# Patient Record
Sex: Male | Born: 1960 | Race: White | Hispanic: No | Marital: Married | Smoking: Former smoker
Health system: Southern US, Community
[De-identification: ages and names within clinical notes are randomized; demographics above are authoritative.]

## PROBLEM LIST (undated history)

## (undated) DIAGNOSIS — R519 Headache, unspecified: Secondary | ICD-10-CM

## (undated) DIAGNOSIS — I10 Essential (primary) hypertension: Secondary | ICD-10-CM

## (undated) DIAGNOSIS — K219 Gastro-esophageal reflux disease without esophagitis: Secondary | ICD-10-CM

## (undated) DIAGNOSIS — K56609 Unspecified intestinal obstruction, unspecified as to partial versus complete obstruction: Secondary | ICD-10-CM

## (undated) DIAGNOSIS — Z87898 Personal history of other specified conditions: Secondary | ICD-10-CM

## (undated) DIAGNOSIS — M1990 Unspecified osteoarthritis, unspecified site: Secondary | ICD-10-CM

## (undated) DIAGNOSIS — F32A Depression, unspecified: Secondary | ICD-10-CM

## (undated) DIAGNOSIS — K259 Gastric ulcer, unspecified as acute or chronic, without hemorrhage or perforation: Secondary | ICD-10-CM

## (undated) DIAGNOSIS — K222 Esophageal obstruction: Secondary | ICD-10-CM

## (undated) DIAGNOSIS — K621 Rectal polyp: Secondary | ICD-10-CM

## (undated) DIAGNOSIS — F329 Major depressive disorder, single episode, unspecified: Secondary | ICD-10-CM

## (undated) DIAGNOSIS — Z972 Presence of dental prosthetic device (complete) (partial): Secondary | ICD-10-CM

## (undated) HISTORY — DX: Gastric ulcer, unspecified as acute or chronic, without hemorrhage or perforation: K25.9

## (undated) HISTORY — DX: Essential (primary) hypertension: I10

## (undated) HISTORY — DX: Esophageal obstruction: K22.2

## (undated) HISTORY — DX: Rectal polyp: K62.1

## (undated) HISTORY — DX: Major depressive disorder, single episode, unspecified: F32.9

## (undated) HISTORY — PX: HERNIA REPAIR: SHX51

## (undated) HISTORY — DX: Depression, unspecified: F32.A

## (undated) HISTORY — PX: ESOPHAGEAL DILATION: SHX303

---

## 2005-02-19 ENCOUNTER — Other Ambulatory Visit: Payer: Self-pay

## 2005-02-26 ENCOUNTER — Ambulatory Visit: Payer: Self-pay | Admitting: General Surgery

## 2005-11-19 ENCOUNTER — Ambulatory Visit: Payer: Self-pay | Admitting: Family Medicine

## 2006-01-10 ENCOUNTER — Ambulatory Visit: Payer: Self-pay | Admitting: Gastroenterology

## 2007-06-12 ENCOUNTER — Ambulatory Visit: Payer: Self-pay | Admitting: Gastroenterology

## 2007-09-18 ENCOUNTER — Emergency Department: Payer: Self-pay | Admitting: Emergency Medicine

## 2007-09-18 ENCOUNTER — Other Ambulatory Visit: Payer: Self-pay

## 2008-02-03 ENCOUNTER — Ambulatory Visit: Payer: Self-pay | Admitting: Family Medicine

## 2008-07-27 DIAGNOSIS — G2581 Restless legs syndrome: Secondary | ICD-10-CM | POA: Insufficient documentation

## 2008-08-23 DIAGNOSIS — G253 Myoclonus: Secondary | ICD-10-CM

## 2008-08-23 HISTORY — DX: Myoclonus: G25.3

## 2008-11-12 DIAGNOSIS — B079 Viral wart, unspecified: Secondary | ICD-10-CM | POA: Insufficient documentation

## 2009-07-17 ENCOUNTER — Ambulatory Visit: Payer: Self-pay | Admitting: Internal Medicine

## 2009-09-20 HISTORY — PX: SHOULDER SURGERY: SHX246

## 2010-02-03 ENCOUNTER — Emergency Department: Payer: Self-pay | Admitting: Emergency Medicine

## 2010-02-09 ENCOUNTER — Inpatient Hospital Stay: Payer: Self-pay | Admitting: Internal Medicine

## 2010-03-27 ENCOUNTER — Ambulatory Visit: Payer: Self-pay | Admitting: Gastroenterology

## 2010-03-29 LAB — PATHOLOGY REPORT

## 2011-01-15 ENCOUNTER — Ambulatory Visit: Payer: Self-pay | Admitting: Gastroenterology

## 2011-01-17 LAB — PATHOLOGY REPORT

## 2011-02-17 ENCOUNTER — Emergency Department: Payer: Self-pay | Admitting: Emergency Medicine

## 2011-02-28 ENCOUNTER — Emergency Department: Payer: Self-pay | Admitting: *Deleted

## 2012-08-25 ENCOUNTER — Ambulatory Visit: Payer: Self-pay | Admitting: Family Medicine

## 2012-08-25 LAB — DOT URINE DIP
Glucose,UR: NEGATIVE mg/dL (ref 0–75)
Protein: NEGATIVE
Specific Gravity: 1.01 (ref 1.003–1.030)

## 2012-08-25 LAB — URINALYSIS, COMPLETE
Bacteria: NEGATIVE
Bilirubin,UR: NEGATIVE
Glucose,UR: NEGATIVE mg/dL (ref 0–75)
Ketone: NEGATIVE
Leukocyte Esterase: NEGATIVE
Nitrite: NEGATIVE
Ph: 6 (ref 4.5–8.0)
Protein: NEGATIVE
Specific Gravity: 1.01 (ref 1.003–1.030)
Squamous Epithelial: NONE SEEN
WBC UR: NONE SEEN /HPF (ref 0–5)

## 2012-08-28 ENCOUNTER — Ambulatory Visit: Payer: Self-pay | Admitting: Family Medicine

## 2013-04-01 ENCOUNTER — Ambulatory Visit: Payer: Self-pay | Admitting: Family Medicine

## 2013-04-01 LAB — PSA: PSA: 0.5

## 2013-04-16 ENCOUNTER — Ambulatory Visit: Payer: Self-pay | Admitting: Physician Assistant

## 2013-04-16 LAB — COMPREHENSIVE METABOLIC PANEL
Albumin: 3.8 g/dL (ref 3.4–5.0)
Alkaline Phosphatase: 74 U/L
Anion Gap: 11 (ref 7–16)
BUN: 10 mg/dL (ref 7–18)
Bilirubin,Total: 0.3 mg/dL (ref 0.2–1.0)
Calcium, Total: 9.6 mg/dL (ref 8.5–10.1)
Chloride: 97 mmol/L — ABNORMAL LOW (ref 98–107)
Co2: 31 mmol/L (ref 21–32)
Creatinine: 1.12 mg/dL (ref 0.60–1.30)
EGFR (African American): >60
EGFR (Non-African Amer.): >60
Glucose: 106 mg/dL — ABNORMAL HIGH (ref 65–99)
Osmolality: 277 (ref 275–301)
Potassium: 4 mmol/L (ref 3.5–5.1)
SGOT(AST): 21 U/L (ref 15–37)
SGPT (ALT): 32 U/L (ref 12–78)
Sodium: 139 mmol/L (ref 136–145)
Total Protein: 8.2 g/dL (ref 6.4–8.2)

## 2013-04-16 LAB — CBC WITH DIFFERENTIAL/PLATELET
Basophil #: 0 10*3/uL (ref 0.0–0.1)
Basophil %: 0.2 %
Eosinophil #: 0.1 10*3/uL (ref 0.0–0.7)
Eosinophil %: 1 %
HCT: 39.2 % — ABNORMAL LOW (ref 40.0–52.0)
HGB: 13.1 g/dL (ref 13.0–18.0)
Lymphocyte #: 1.4 10*3/uL (ref 1.0–3.6)
Lymphocyte %: 11.5 %
MCH: 31.5 pg (ref 26.0–34.0)
MCHC: 33.3 g/dL (ref 32.0–36.0)
MCV: 94 fL (ref 80–100)
Monocyte #: 0.7 x10 3/mm (ref 0.2–1.0)
Monocyte %: 6.1 %
Neutrophil #: 9.7 10*3/uL — ABNORMAL HIGH (ref 1.4–6.5)
Neutrophil %: 81.2 %
Platelet: 379 10*3/uL (ref 150–440)
RBC: 4.16 10*6/uL — ABNORMAL LOW (ref 4.40–5.90)
RDW: 12.5 % (ref 11.5–14.5)
WBC: 11.9 10*3/uL — ABNORMAL HIGH (ref 3.8–10.6)

## 2013-04-16 LAB — LIPASE, BLOOD: Lipase: 117 U/L (ref 73–393)

## 2013-04-16 LAB — OCCULT BLOOD X 1 CARD TO LAB, STOOL: Occult Blood, Feces: NEGATIVE

## 2013-04-16 LAB — AMYLASE: Amylase: 66 U/L (ref 25–115)

## 2013-08-09 ENCOUNTER — Ambulatory Visit: Payer: Self-pay | Admitting: Physician Assistant

## 2013-08-09 LAB — DOT URINE DIP
Glucose,UR: NEGATIVE mg/dL (ref 0–75)
Protein: NEGATIVE
Specific Gravity: 1.005 (ref 1.003–1.030)

## 2013-10-11 LAB — FECAL OCCULT BLOOD, GUAIAC: Fecal Occult Blood: NEGATIVE

## 2014-07-28 ENCOUNTER — Ambulatory Visit
Admit: 2014-07-28 | Disposition: A | Discharge status: Home / Self Care | Payer: Self-pay | Attending: Family Medicine | Admitting: Family Medicine

## 2014-07-28 LAB — DOT URINE DIP
Glucose,UR: NEGATIVE
Protein: NEGATIVE
Specific Gravity: 1.02 (ref 1.000–1.030)

## 2014-08-08 LAB — LIPID PANEL
Cholesterol: 201 mg/dL — AB (ref 0–200)
HDL: 50 mg/dL (ref 35–70)
LDL Cholesterol: 113 mg/dL
Triglycerides: 189 mg/dL — AB (ref 40–160)

## 2014-08-08 LAB — BASIC METABOLIC PANEL
Creatinine: 1 mg/dL (ref <=1.3)
Glucose: 91 mg/dL

## 2014-08-25 DIAGNOSIS — Z0000 Encounter for general adult medical examination without abnormal findings: Secondary | ICD-10-CM | POA: Insufficient documentation

## 2014-08-25 DIAGNOSIS — D509 Iron deficiency anemia, unspecified: Secondary | ICD-10-CM | POA: Insufficient documentation

## 2014-08-25 DIAGNOSIS — I10 Essential (primary) hypertension: Secondary | ICD-10-CM | POA: Insufficient documentation

## 2014-08-25 DIAGNOSIS — F339 Major depressive disorder, recurrent, unspecified: Secondary | ICD-10-CM | POA: Insufficient documentation

## 2014-08-25 DIAGNOSIS — M5137 Other intervertebral disc degeneration, lumbosacral region: Secondary | ICD-10-CM | POA: Insufficient documentation

## 2014-08-25 DIAGNOSIS — K922 Gastrointestinal hemorrhage, unspecified: Secondary | ICD-10-CM | POA: Insufficient documentation

## 2014-08-25 DIAGNOSIS — M51379 Other intervertebral disc degeneration, lumbosacral region without mention of lumbar back pain or lower extremity pain: Secondary | ICD-10-CM | POA: Insufficient documentation

## 2014-08-25 DIAGNOSIS — E785 Hyperlipidemia, unspecified: Secondary | ICD-10-CM | POA: Insufficient documentation

## 2014-08-25 DIAGNOSIS — M5440 Lumbago with sciatica, unspecified side: Secondary | ICD-10-CM | POA: Insufficient documentation

## 2014-11-13 ENCOUNTER — Other Ambulatory Visit: Payer: Self-pay | Admitting: Family Medicine

## 2014-11-13 DIAGNOSIS — M545 Low back pain, unspecified: Secondary | ICD-10-CM

## 2014-11-13 DIAGNOSIS — I10 Essential (primary) hypertension: Secondary | ICD-10-CM

## 2015-01-31 ENCOUNTER — Other Ambulatory Visit: Payer: Self-pay | Admitting: Family Medicine

## 2015-03-01 ENCOUNTER — Other Ambulatory Visit: Payer: Self-pay | Admitting: Family Medicine

## 2015-03-10 ENCOUNTER — Encounter: Payer: Self-pay | Admitting: Family Medicine

## 2015-03-10 ENCOUNTER — Ambulatory Visit (INDEPENDENT_AMBULATORY_CARE_PROVIDER_SITE_OTHER): Payer: BLUE CROSS/BLUE SHIELD | Admitting: Family Medicine

## 2015-03-10 VITALS — BP 138/88 | HR 76 | Ht 63.0 in | Wt 166.0 lb

## 2015-03-10 DIAGNOSIS — M519 Unspecified thoracic, thoracolumbar and lumbosacral intervertebral disc disorder: Secondary | ICD-10-CM | POA: Diagnosis not present

## 2015-03-10 DIAGNOSIS — I10 Essential (primary) hypertension: Secondary | ICD-10-CM | POA: Diagnosis not present

## 2015-03-10 MED ORDER — BACLOFEN 20 MG PO TABS: ORAL_TABLET | ORAL | Status: DC

## 2015-03-10 MED ORDER — AMLODIPINE BESY-BENAZEPRIL HCL 10-20 MG PO CAPS: ORAL_CAPSULE | ORAL | Status: DC

## 2015-03-10 NOTE — Progress Notes (Signed)
Name: Alan Trevino   MRN: [id number]    DOB: [birth year]   Date:03/10/2015       Progress Note  Subjective  Chief Complaint  Chief Complaint  Patient presents with  . Hypertension  . Back Pain    needs refill on Baclofen- takes at night to help relax so he can sleep    Hypertension This is a chronic problem. The current episode started more than 1 year ago. The problem has been gradually improving since onset. The problem is controlled. Pertinent negatives include no anxiety, blurred vision, chest pain, headaches, malaise/fatigue, neck pain, orthopnea, palpitations, peripheral edema, PND, shortness of breath or sweats. There are no known risk factors for coronary artery disease. Past treatments include ACE inhibitors and calcium channel blockers. The current treatment provides mild improvement. There are no compliance problems.  There is no history of angina, kidney disease, CAD/MI, CVA, heart failure, left ventricular hypertrophy, PVD, renovascular disease or retinopathy. There is no history of chronic renal disease or a hypertension causing med.  Back Pain This is a chronic problem. The current episode started more than 1 year ago. The problem occurs daily. The problem has been waxing and waning since onset. The pain is present in the lumbar spine. The quality of the pain is described as aching. The pain does not radiate. The pain is mild. The symptoms are aggravated by bending and twisting. Pertinent negatives include no abdominal pain, bladder incontinence, bowel incontinence, chest pain, dysuria, fever, headaches, leg pain, numbness, paresis, paresthesias, tingling, weakness or weight loss.    No problem-specific assessment & plan notes found for this encounter.   Past Medical History  Diagnosis Date  . Hypertension   . Depression     Past Surgical History  Procedure Laterality Date  . Hernia repair    . Shoulder surgery  09/2009  . Esophageal dilation      stretching     History reviewed. No pertinent family history.  Social History   Social History  . Marital Status: Divorced    Spouse Name: N/A  . Number of Children: N/A  . Years of Education: N/A   Occupational History  . Not on file.   Social History Main Topics  . Smoking status: Former Research scientist (life sciences)  . Smokeless tobacco: Not on file  . Alcohol Use: No  . Drug Use: No  . Sexual Activity: Not Currently   Other Topics Concern  . Not on file   Social History Narrative    No Known Allergies   Review of Systems  Constitutional: Negative for fever, chills, weight loss and malaise/fatigue.  HENT: Negative for ear discharge, ear pain and sore throat.   Eyes: Negative for blurred vision.  Respiratory: Negative for cough, sputum production, shortness of breath and wheezing.   Cardiovascular: Negative for chest pain, palpitations, orthopnea, leg swelling and PND.  Gastrointestinal: Negative for heartburn, nausea, abdominal pain, diarrhea, constipation, blood in stool, melena and bowel incontinence.  Genitourinary: Negative for bladder incontinence, dysuria, urgency, frequency and hematuria.  Musculoskeletal: Positive for back pain. Negative for myalgias, joint pain and neck pain.  Skin: Negative for rash.  Neurological: Negative for dizziness, tingling, sensory change, focal weakness, weakness, numbness, headaches and paresthesias.  Endo/Heme/Allergies: Negative for environmental allergies and polydipsia. Does not bruise/bleed easily.  Psychiatric/Behavioral: Negative for depression and suicidal ideas. The patient is not nervous/anxious and does not have insomnia.      Objective  Filed Vitals:   03/10/15 1511  BP: 138/88  Pulse:  76  Height: 5\' 3"  (1.6 m)  Weight: 166 lb (75.297 kg)    Physical Exam  Constitutional: He is oriented to person, place, and time and well-developed, well-nourished, and in no distress.  HENT:  Head: Normocephalic.  Right Ear: External ear normal.  Left  Ear: External ear normal.  Nose: Nose normal.  Mouth/Throat: Oropharynx is clear and moist.  Eyes: Conjunctivae and EOM are normal. Pupils are equal, round, and reactive to light. Right eye exhibits no discharge. Left eye exhibits no discharge. No scleral icterus.  Neck: Normal range of motion. Neck supple. No JVD present. No tracheal deviation present. No thyromegaly present.  Cardiovascular: Normal rate, regular rhythm, normal heart sounds and intact distal pulses.  Exam reveals no gallop and no friction rub.   No murmur heard. Pulmonary/Chest: Breath sounds normal. No respiratory distress. He has no wheezes. He has no rales.  Abdominal: Soft. Bowel sounds are normal. He exhibits no mass. There is no hepatosplenomegaly. There is no tenderness. There is no rebound, no guarding and no CVA tenderness.  Musculoskeletal: Normal range of motion. He exhibits no edema or tenderness.  Lymphadenopathy:    He has no cervical adenopathy.  Neurological: He is alert and oriented to person, place, and time. He has normal sensation, normal strength and intact cranial nerves. No cranial nerve deficit.  Skin: Skin is warm. No rash noted.  Psychiatric: Mood and affect normal.      Assessment & Plan  Problem List Items Addressed This Visit    None    Visit Diagnoses    Essential hypertension    -  Primary    Relevant Medications    amLODipine-benazepril (LOTREL) 10-20 MG capsule    Other Relevant Orders    Renal Function Panel    Lumbar disc disease        Relevant Medications    baclofen (LIORESAL) 20 MG tablet         Dr. [facility]  03/10/2015

## 2015-03-11 LAB — RENAL FUNCTION PANEL
Albumin: 4.8 g/dL (ref 3.5–5.5)
BUN/Creatinine Ratio: 11 (ref 9–20)
BUN: 13 mg/dL (ref 6–24)
CO2: 27 mmol/L (ref 18–29)
Calcium: 10.2 mg/dL (ref 8.7–10.2)
Chloride: 96 mmol/L — ABNORMAL LOW (ref 97–106)
Creatinine, Ser: 1.17 mg/dL (ref 0.76–1.27)
GFR calc Af Amer: 81 mL/min/{1.73_m2} (ref >59)
GFR calc non Af Amer: 70 mL/min/{1.73_m2} (ref >59)
Glucose: 89 mg/dL (ref 65–99)
Phosphorus: 4.2 mg/dL (ref 2.5–4.5)
Potassium: 4.6 mmol/L (ref 3.5–5.2)
Sodium: 140 mmol/L (ref 136–144)

## 2015-06-30 ENCOUNTER — Encounter: Payer: Self-pay | Admitting: *Deleted

## 2015-06-30 ENCOUNTER — Ambulatory Visit
Admission: EM | Admit: 2015-06-30 | Discharge: 2015-06-30 | Disposition: A | Discharge status: Home / Self Care | Payer: Self-pay | Attending: Family Medicine | Admitting: Family Medicine

## 2015-06-30 DIAGNOSIS — Z024 Encounter for examination for driving license: Secondary | ICD-10-CM

## 2015-06-30 DIAGNOSIS — Z029 Encounter for administrative examinations, unspecified: Secondary | ICD-10-CM

## 2015-06-30 LAB — DEPT OF TRANSP DIPSTICK, URINE (ARMC ONLY)
Glucose, UA: NEGATIVE mg/dL
Protein, ur: NEGATIVE mg/dL
Specific Gravity, Urine: 1.01 (ref 1.005–1.030)

## 2015-06-30 NOTE — Discharge Instructions (Signed)
Medical Screening Exam °A medical screening exam has been done. This exam helps find the cause of your problem and determines whether you need emergency treatment. Your exam has shown that you do not need emergency treatment at this point. It is safe for you to go to your caregiver's office or clinic for treatment. You should make an appointment today to see your caregiver as soon as he or she is available. °Depending on your illness, your symptoms and condition can change over time. If your condition gets worse or you develop new or troubling symptoms before you see your caregiver, you should return to the emergency department for further evaluation.  °  °This information is not intended to replace advice given to you by your health care provider. Make sure you discuss any questions you have with your health care provider. °  °Document Released: 05/16/2004 Document Revised: 04/29/2014 Document Reviewed: 12/26/2010 °Elsevier Interactive Patient Education ©2016 [employer] ° °

## 2015-06-30 NOTE — ED Provider Notes (Signed)
CSN: [id number]     Arrival date & time 06/30/15  0941 History   First MD Initiated Contact with Patient 06/30/15 1102    Nurses notes were reviewed. Chief Complaint  Patient presents with  . Commercial Driver's License Exam   Patient has a history of hypertension and depression his blood pressures under control and we have a note from his Dr. Kasandra Knudsen that he is doing well Wellbutrin XL buspirone and Ritalin Patient is here for DOT examination. (Consider location/radiation/quality/duration/timing/severity/associated sxs/prior Treatment) The history is provided by the patient. No language interpreter was used.    Past Medical History  Diagnosis Date  . Hypertension   . Depression    Past Surgical History  Procedure Laterality Date  . Hernia repair    . Shoulder surgery  09/2009  . Esophageal dilation      stretching   History reviewed. No pertinent family history. Social History  Substance Use Topics  . Smoking status: Former Research scientist (life sciences)  . Smokeless tobacco: None  . Alcohol Use: No    Review of Systems  All other systems reviewed and are negative.   Allergies  Review of patient's allergies indicates no known allergies.  Home Medications   Prior to Admission medications   Medication Sig Start Date End Date Taking? Authorizing Provider  amLODipine-benazepril (LOTREL) 10-20 MG capsule TAKE 1 CAPSULE(S) CAPSULE(S), ORAL, DAILY 03/10/15  Yes Juline Patch, MD  buPROPion (WELLBUTRIN XL) 300 MG 24 hr tablet Take 1 tablet by mouth daily. Dr Kasandra Knudsen 10/11/13  Yes Historical Provider, MD  busPIRone (BUSPAR) 15 MG tablet Take 1 tablet by mouth 2 (two) times daily. Dr Kasandra Knudsen   Yes Historical Provider, MD  methylphenidate (RITALIN) 20 MG tablet Take 1 tablet by mouth 2 (two) times daily.   Yes Historical Provider, MD  baclofen (LIORESAL) 20 MG tablet TAKE 1 TABLET BY MOUTH EVERY DAY-MAKE APPT FOR REFILLS 03/10/15   Juline Patch, MD   Meds Ordered and Administered this Visit  Medications - No  data to display  BP 133/79 mmHg  Pulse 63  Temp(Src) 98 F (36.7 C) (Oral)  Resp 16  Ht 5\' 4"  (1.626 m)  Wt 157 lb (71.215 kg)  BMI 26.94 kg/m2  SpO2 100% No data found.   Physical Exam  Constitutional: He is oriented to person, place, and time. He appears well-developed and well-nourished.  HENT:  Head: Normocephalic.  Eyes: Pupils are equal, round, and reactive to light.  Neck: Normal range of motion.  Cardiovascular: Normal rate and regular rhythm.   Pulmonary/Chest: Effort normal and breath sounds normal.  Abdominal: Soft. Bowel sounds are normal. He exhibits no distension. Hernia confirmed negative in the right inguinal area and confirmed negative in the left inguinal area.  Genitourinary: Testes normal and penis normal. Right testis shows no mass and no tenderness. Left testis shows no mass and no tenderness.  Musculoskeletal: Normal range of motion. He exhibits no tenderness.  Neurological: He is alert and oriented to person, place, and time.  Skin: Skin is warm and dry.  Psychiatric: He has a normal mood and affect.  Vitals reviewed.   ED Course  Procedures (including critical care time)  Labs Review Labs Reviewed  DEPT OF TRANSP DIPSTICK, URINE([facility] ONLY)    Imaging Review No results found.   Visual Acuity Review  Right Eye Distance:   Left Eye Distance:   Bilateral Distance:    Right Eye Near:   Left Eye Near:    Bilateral Near:  Results for orders placed or performed during the hospital encounter of 06/30/15  Dept of Transp dipstick, urine  Result Value Ref Range   Protein, ur NEGATIVE NEGATIVE mg/dL   Glucose, UA NEGATIVE NEGATIVE mg/dL   Specific Gravity, Urine 1.010 1.005 - 1.030   Hgb urine dipstick TRACE (A) NEGATIVE     MDM  No diagnosis found. Patient seen for DOT examination. Will give one year certificate with glasses restriction.    Frederich Cha, MD 06/30/15 [phone]

## 2015-06-30 NOTE — ED Notes (Signed)
DOT physical. 

## 2015-09-23 ENCOUNTER — Other Ambulatory Visit: Payer: Self-pay | Admitting: Family Medicine

## 2015-10-04 ENCOUNTER — Other Ambulatory Visit: Payer: Self-pay | Admitting: Family Medicine

## 2015-11-03 ENCOUNTER — Encounter: Payer: Self-pay | Admitting: Family Medicine

## 2015-11-03 ENCOUNTER — Ambulatory Visit (INDEPENDENT_AMBULATORY_CARE_PROVIDER_SITE_OTHER): Payer: BLUE CROSS/BLUE SHIELD | Admitting: Family Medicine

## 2015-11-03 VITALS — BP 120/80 | HR 84 | Ht 64.0 in | Wt 154.0 lb

## 2015-11-03 DIAGNOSIS — F339 Major depressive disorder, recurrent, unspecified: Secondary | ICD-10-CM | POA: Diagnosis not present

## 2015-11-03 DIAGNOSIS — Z1211 Encounter for screening for malignant neoplasm of colon: Secondary | ICD-10-CM | POA: Diagnosis not present

## 2015-11-03 DIAGNOSIS — M5137 Other intervertebral disc degeneration, lumbosacral region: Secondary | ICD-10-CM

## 2015-11-03 DIAGNOSIS — I10 Essential (primary) hypertension: Secondary | ICD-10-CM | POA: Diagnosis not present

## 2015-11-03 DIAGNOSIS — N400 Enlarged prostate without lower urinary tract symptoms: Secondary | ICD-10-CM | POA: Diagnosis not present

## 2015-11-03 DIAGNOSIS — M519 Unspecified thoracic, thoracolumbar and lumbosacral intervertebral disc disorder: Secondary | ICD-10-CM | POA: Diagnosis not present

## 2015-11-03 LAB — HEMOCCULT GUIAC POC 1CARD (OFFICE): Fecal Occult Blood, POC: NEGATIVE

## 2015-11-03 MED ORDER — BACLOFEN 20 MG PO TABS: ORAL_TABLET | ORAL | Status: DC

## 2015-11-03 MED ORDER — AMLODIPINE BESY-BENAZEPRIL HCL 10-20 MG PO CAPS: ORAL_CAPSULE | ORAL | Status: DC

## 2015-11-03 NOTE — Progress Notes (Signed)
Name: Alan Trevino   MRN: [id number]    DOB: 1961/04/06   Date:11/03/2015       Progress Note  Subjective  Chief Complaint  Chief Complaint  Patient presents with  . Hypertension  . 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 The current episode started more than 1 year ago. The problem has been gradually improving since onset. The problem is controlled. Pertinent negatives include no anxiety, blurred vision, chest pain, headaches, malaise/fatigue, neck pain, orthopnea, palpitations, peripheral edema, PND, shortness of breath or sweats. There are no associated agents to hypertension. There are no known risk factors for coronary artery disease. Past treatments include ACE inhibitors and calcium channel blockers. The current treatment provides moderate improvement. There are no compliance problems.  There is no history of angina, kidney disease, CAD/MI, CVA, heart failure, left ventricular hypertrophy, PVD, renovascular disease or retinopathy. There is no history of chronic renal disease or a hypertension causing med.  Back Pain This is a chronic problem. The current episode started more than 1 year ago. The problem has been waxing and waning since onset. The pain is present in the lumbar spine. The quality of the pain is described as aching. Pertinent negatives include no abdominal pain, chest pain, dysuria, fever, headaches, leg pain, paresis, paresthesias, perianal numbness, tingling, weakness or weight loss. He has tried NSAIDs for the symptoms. The treatment provided mild relief.    No problem-specific assessment & plan notes found for this encounter.   Past Medical History  Diagnosis Date  . Hypertension   . Depression     Past Surgical History  Procedure Laterality Date  . Hernia repair    . Shoulder surgery  09/2009  . Esophageal dilation      stretching    History reviewed. No pertinent family history.  Social  History   Social History  . Marital Status: Divorced    Spouse Name: N/A  . Number of Children: N/A  . Years of Education: N/A   Occupational History  . Not on file.   Social History Main Topics  . Smoking status: Former Research scientist (life sciences)  . Smokeless tobacco: Not on file  . Alcohol Use: No  . Drug Use: No  . Sexual Activity: Not Currently   Other Topics Concern  . Not on file   Social History Narrative    No Known Allergies   Review of Systems  Constitutional: Negative for fever, chills, weight loss and malaise/fatigue.  HENT: Negative for ear discharge, ear pain and sore throat.   Eyes: Negative for blurred vision.  Respiratory: Negative for cough, sputum production, shortness of breath and wheezing.   Cardiovascular: Negative for chest pain, palpitations, orthopnea, leg swelling and PND.  Gastrointestinal: Negative for heartburn, nausea, abdominal pain, diarrhea, constipation, blood in stool and melena.  Genitourinary: Negative for dysuria, urgency, frequency and hematuria.  Musculoskeletal: Positive for back pain. Negative for myalgias, joint pain and neck pain.  Skin: Negative for rash.  Neurological: Negative for dizziness, tingling, sensory change, focal weakness, weakness, headaches and paresthesias.  Endo/Heme/Allergies: Negative for environmental allergies and polydipsia. Does not bruise/bleed easily.  Psychiatric/Behavioral: Negative for depression and suicidal ideas. The patient is not nervous/anxious and does not have insomnia.      Objective  Filed Vitals:   11/03/15 0832  BP: 120/80  Pulse: 84  Height: 5\' 4"  (1.626 m)  Weight: 154 lb (69.854 kg)    Physical  Exam  Constitutional: He is oriented to person, place, and time and well-developed, well-nourished, and in no distress.  HENT:  Head: Normocephalic.  Right Ear: External ear normal.  Left Ear: External ear normal.  Nose: Nose normal.  Mouth/Throat: Oropharynx is clear and moist.  Eyes: Conjunctivae  and EOM are normal. Pupils are equal, round, and reactive to light. Right eye exhibits no discharge. Left eye exhibits no discharge. No scleral icterus.  Neck: Normal range of motion. Neck supple. No JVD present. No tracheal deviation present. No thyromegaly present.  Cardiovascular: Normal rate, regular rhythm, normal heart sounds and intact distal pulses.  Exam reveals no gallop and no friction rub.   No murmur heard. Pulmonary/Chest: Breath sounds normal. No respiratory distress. He has no wheezes. He has no rales.  Abdominal: Soft. Bowel sounds are normal. He exhibits no mass. There is no hepatosplenomegaly. There is no tenderness. There is no rebound, no guarding and no CVA tenderness.  Genitourinary: Rectum normal. Prostate is enlarged. Prostate is not tender.  Musculoskeletal: Normal range of motion. He exhibits no edema or tenderness.  Lymphadenopathy:    He has no cervical adenopathy.  Neurological: He is alert and oriented to person, place, and time. He has normal sensation, normal strength, normal reflexes and intact cranial nerves. No cranial nerve deficit.  Skin: Skin is warm. No rash noted.  Psychiatric: Mood and affect normal.  Nursing note and vitals reviewed.     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 Otilio [facility]  11/03/2015

## 2015-11-04 LAB — PSA: Prostate Specific Ag, Serum: 1 ng/mL (ref 0.0–4.0)

## 2015-11-10 ENCOUNTER — Other Ambulatory Visit: Payer: Self-pay

## 2015-11-10 ENCOUNTER — Telehealth: Payer: Self-pay

## 2015-11-10 NOTE — Telephone Encounter (Signed)
Gastroenterology Pre-Procedure Review  Request Date: 12/29/2015 Requesting Physician: Dr. Ronnald Ramp  PATIENT REVIEW QUESTIONS: The patient responded to the following health history questions as indicated:    1. Are you having any GI issues? yes (constipation) 2. Do you have a personal history of Polyps? no 3. Do you have a family history of Colon Cancer or Polyps? yes (colon cancer) 4. Diabetes Mellitus? no 5. Joint replacements in the past 12 months?no 6. Major health problems in the past 3 months?no 7. Any artificial heart valves, MVP, or defibrillator?no    MEDICATIONS & ALLERGIES:    Patient reports the following regarding taking any anticoagulation/antiplatelet therapy:   Plavix, Coumadin, Eliquis, Xarelto, Lovenox, Pradaxa, Brilinta, or Effient? no Aspirin? no  Patient confirms/reports the following medications:  Current Outpatient Prescriptions  Medication Sig Dispense Refill  . amLODipine-benazepril (LOTREL) 10-20 MG capsule TAKE 1 CAPSULE(S) CAPSULE(S) BY MOUTH DAILY 30 capsule 6  . baclofen (LIORESAL) 20 MG tablet TAKE 1 TABLET BY MOUTH EVERY DAY-MAKE APPT FOR REFILLS 30 tablet 6  . buPROPion (WELLBUTRIN XL) 300 MG 24 hr tablet Take 1 tablet by mouth daily. Dr Kasandra Knudsen    . busPIRone (BUSPAR) 15 MG tablet Take 1 tablet by mouth 2 (two) times daily. Dr Kasandra Knudsen    . methylphenidate (RITALIN) 20 MG tablet Take 1 tablet by mouth 2 (two) times daily. Dr Kasandra Knudsen     No current facility-administered medications for this visit.    Patient confirms/reports the following allergies:  No Known Allergies  No orders of the defined types were placed in this encounter.    AUTHORIZATION INFORMATION Primary Insurance: 1D#: Group #:  Secondary Insurance: 1D#: Group #:  SCHEDULE INFORMATION: Date: 12/29/2015  Time: Location: [facility]

## 2015-12-28 NOTE — Discharge Instructions (Signed)

## 2015-12-29 ENCOUNTER — Ambulatory Visit: Payer: BLUE CROSS/BLUE SHIELD | Admitting: Anesthesiology

## 2015-12-29 ENCOUNTER — Encounter
Admission: RE | Disposition: A | Discharge status: Home / Self Care | Payer: Self-pay | Source: Ambulatory Visit | Attending: Gastroenterology

## 2015-12-29 ENCOUNTER — Ambulatory Visit
Admission: RE | Admit: 2015-12-29 | Discharge: 2015-12-29 | Disposition: A | Discharge status: Home / Self Care | Payer: BLUE CROSS/BLUE SHIELD | Source: Ambulatory Visit | Attending: Gastroenterology | Admitting: Gastroenterology

## 2015-12-29 DIAGNOSIS — K621 Rectal polyp: Secondary | ICD-10-CM | POA: Diagnosis not present

## 2015-12-29 DIAGNOSIS — K5730 Diverticulosis of large intestine without perforation or abscess without bleeding: Secondary | ICD-10-CM | POA: Diagnosis not present

## 2015-12-29 DIAGNOSIS — D122 Benign neoplasm of ascending colon: Secondary | ICD-10-CM

## 2015-12-29 DIAGNOSIS — Z9889 Other specified postprocedural states: Secondary | ICD-10-CM | POA: Insufficient documentation

## 2015-12-29 DIAGNOSIS — F329 Major depressive disorder, single episode, unspecified: Secondary | ICD-10-CM | POA: Diagnosis not present

## 2015-12-29 DIAGNOSIS — I10 Essential (primary) hypertension: Secondary | ICD-10-CM | POA: Diagnosis not present

## 2015-12-29 DIAGNOSIS — K641 Second degree hemorrhoids: Secondary | ICD-10-CM | POA: Diagnosis not present

## 2015-12-29 DIAGNOSIS — D124 Benign neoplasm of descending colon: Secondary | ICD-10-CM

## 2015-12-29 DIAGNOSIS — D123 Benign neoplasm of transverse colon: Secondary | ICD-10-CM | POA: Diagnosis not present

## 2015-12-29 DIAGNOSIS — Z79899 Other long term (current) drug therapy: Secondary | ICD-10-CM | POA: Insufficient documentation

## 2015-12-29 DIAGNOSIS — Z1211 Encounter for screening for malignant neoplasm of colon: Secondary | ICD-10-CM

## 2015-12-29 DIAGNOSIS — Z87891 Personal history of nicotine dependence: Secondary | ICD-10-CM | POA: Insufficient documentation

## 2015-12-29 HISTORY — PX: COLONOSCOPY WITH PROPOFOL: SHX5780

## 2015-12-29 HISTORY — PX: POLYPECTOMY: SHX5525

## 2015-12-29 SURGERY — COLONOSCOPY WITH PROPOFOL
Anesthesia: Monitor Anesthesia Care | Site: Rectum | Wound class: Contaminated

## 2015-12-29 MED ORDER — LACTATED RINGERS IV SOLN
INTRAVENOUS | Status: DC
  Administered 2015-12-29: 10:00:00 via INTRAVENOUS

## 2015-12-29 MED ORDER — SIMETHICONE 40 MG/0.6ML PO SUSP
ORAL | Status: DC | PRN
  Administered 2015-12-29: 11:00:00

## 2015-12-29 MED ORDER — LIDOCAINE HCL (CARDIAC) 20 MG/ML IV SOLN
INTRAVENOUS | Status: DC | PRN
  Administered 2015-12-29: 50 mg via INTRAVENOUS

## 2015-12-29 MED ORDER — PROPOFOL 10 MG/ML IV BOLUS
INTRAVENOUS | Status: DC | PRN
  Administered 2015-12-29 (×2): 100 mg via INTRAVENOUS
  Administered 2015-12-29 (×3): 50 mg via INTRAVENOUS

## 2015-12-29 SURGICAL SUPPLY — 23 items
CANISTER SUCT 1200ML W/VALVE (MISCELLANEOUS) ×3 IMPLANT
CLIP HMST 235XBRD CATH ROT (MISCELLANEOUS) IMPLANT
CLIP RESOLUTION 360 11X235 (MISCELLANEOUS)
FCP ESCP3.2XJMB 240X2.8X (MISCELLANEOUS)
FORCEPS BIOP RAD 4 LRG CAP 4 (CUTTING FORCEPS) IMPLANT
FORCEPS BIOP RJ4 240 W/NDL (MISCELLANEOUS)
FORCEPS ESCP3.2XJMB 240X2.8X (MISCELLANEOUS) IMPLANT
GOWN CVR UNV OPN BCK APRN NK (MISCELLANEOUS) ×2 IMPLANT
GOWN ISOL THUMB LOOP REG UNIV (MISCELLANEOUS) ×4
INJECTOR VARIJECT VIN23 (MISCELLANEOUS) IMPLANT
KIT DEFENDO VALVE AND CONN (KITS) IMPLANT
KIT ENDO PROCEDURE OLY (KITS) ×3 IMPLANT
MARKER SPOT ENDO TATTOO 5ML (MISCELLANEOUS) IMPLANT
PAD GROUND ADULT SPLIT (MISCELLANEOUS) ×3 IMPLANT
PROBE APC STR FIRE (PROBE) IMPLANT
RETRIEVER NET ROTH 2.5X230 LF (MISCELLANEOUS) IMPLANT
SNARE SHORT THROW 13M SML OVAL (MISCELLANEOUS) ×3 IMPLANT
SNARE SHORT THROW 30M LRG OVAL (MISCELLANEOUS) IMPLANT
SNARE SNG USE RND 15MM (INSTRUMENTS) IMPLANT
SPOT EX ENDOSCOPIC TATTOO (MISCELLANEOUS)
TRAP ETRAP POLY (MISCELLANEOUS) ×6 IMPLANT
VARIJECT INJECTOR VIN23 (MISCELLANEOUS)
WATER STERILE IRR 250ML POUR (IV SOLUTION) ×3 IMPLANT

## 2015-12-29 NOTE — Op Note (Signed)
[facility] Gastroenterology Patient Name: Alan Trevino Procedure Date: 12/29/2015 11:00 AM MRN: [id number] Account #: [id number] Date of Birth: [birth year] Admit Type: Outpatient Age: 55 Room: [facility] OR ROOM 01 Gender: Male Note Status: Finalized Procedure:            Colonoscopy Indications:          Screening for colorectal malignant neoplasm Providers:            [name] MD, MD Referring MD:         Juline Patch, MD (Referring MD) Medicines:            Propofol per Anesthesia Complications:        No immediate complications. Procedure:            Pre-Anesthesia Assessment:                       - Prior to the procedure, a History and Physical was                        performed, and patient medications and allergies were                        reviewed. The patient's tolerance of previous                        anesthesia was also reviewed. The risks and benefits of                        the procedure and the sedation options and risks were                        discussed with the patient. All questions were                        answered, and informed consent was obtained. Prior                        Anticoagulants: The patient has taken no previous                        anticoagulant or antiplatelet agents. ASA Grade                        Assessment: II - A patient with mild systemic disease.                        After reviewing the risks and benefits, the patient was                        deemed in satisfactory condition to undergo the                        procedure.                       After obtaining informed consent, the colonoscope was                        passed under direct vision. Throughout the procedure,  the patient's blood pressure, pulse, and oxygen                        saturations were monitored continuously. The Olympus CF                        H180AL colonoscope (S#: [id number]) was introduced through                         the anus and advanced to the the cecum, identified by                        appendiceal orifice and ileocecal valve. The                        colonoscopy was performed without difficulty. The                        patient tolerated the procedure well. The quality of                        the bowel preparation was excellent. Findings:      The perianal and digital rectal examinations were normal.      Two sessile polyps were found in the ascending colon. The polyps were 4       to 6 mm in size. These polyps were removed with a hot snare. Resection       and retrieval were complete.      A 4 mm polyp was found in the transverse colon. The polyp was sessile.       The polyp was removed with a cold snare. Resection and retrieval were       complete.      A 4 mm polyp was found in the descending colon. The polyp was sessile.       The polyp was removed with a cold snare. Resection and retrieval were       complete.      A 3 mm polyp was found in the rectum. The polyp was sessile. The polyp       was removed with a cold biopsy forceps. Resection and retrieval were       complete.      Multiple small-mouthed diverticula were found in the sigmoid colon.      Non-bleeding internal hemorrhoids were found during retroflexion. The       hemorrhoids were Grade II (internal hemorrhoids that prolapse but reduce       spontaneously). Impression:           - Two 4 to 6 mm polyps in the ascending colon, removed                        with a hot snare. Resected and retrieved.                       - One 4 mm polyp in the transverse colon, removed with                        a cold snare. Resected and retrieved.                       -  One 4 mm polyp in the descending colon, removed with                        a cold snare. Resected and retrieved.                       - One 3 mm polyp in the rectum, removed with a cold                        biopsy forceps. Resected and  retrieved.                       - Diverticulosis in the sigmoid colon.                       - Non-bleeding internal hemorrhoids. Recommendation:       - Discharge patient to home.                       - Resume previous diet.                       - Continue present medications.                       - Await pathology results.                       - Repeat colonoscopy in 5 years if polyp adenoma and 10                        years if hyperplastic Procedure Code(s):    --- Professional ---                       [phone], Colonoscopy, flexible; with removal of tumor(s),                        polyp(s), or other lesion(s) by snare technique                       45380, 73, Colonoscopy, flexible; with biopsy, single                        or multiple Diagnosis Code(s):    --- Professional ---                       Z12.11, Encounter for screening for malignant neoplasm                        of colon                       D12.2, Benign neoplasm of ascending colon                       D12.3, Benign neoplasm of transverse colon (hepatic                        flexure or splenic flexure)                       D12.4, Benign neoplasm of descending colon  K62.1, Rectal polyp CPT copyright 2016 American Medical Association. All rights reserved. The codes documented in this report are preliminary and upon coder review may  be revised to meet current compliance requirements. [name] MD, MD 12/29/2015 11:27:55 AM This report has been signed electronically. Number of Addenda: 0 Note Initiated On: 12/29/2015 11:00 AM Scope Withdrawal Time: 0 hours 9 minutes 33 seconds  Total Procedure Duration: 0 hours 16 minutes 35 seconds       [facility]

## 2015-12-29 NOTE — Anesthesia Postprocedure Evaluation (Signed)
Anesthesia Post Note  Patient: Alan Trevino  Procedure(s) Performed: Procedure(s) (LRB): COLONOSCOPY WITH PROPOFOL (N/A) POLYPECTOMY (N/A)  Patient location during evaluation: PACU Anesthesia Type: MAC Level of consciousness: awake and alert Pain management: pain level controlled Vital Signs Assessment: post-procedure vital signs reviewed and stable Respiratory status: spontaneous breathing, nonlabored ventilation, respiratory function stable and patient connected to nasal cannula oxygen Cardiovascular status: stable and blood pressure returned to baseline Anesthetic complications: no    Marshell Levan

## 2015-12-29 NOTE — H&P (Signed)
  Alan Lame, MD [facility] [address], [place], [place] [address] Phone: [phone] Fax : [phone]  Primary Care Physician:  Otilio Miu, MD Primary Gastroenterologist:  Dr. Allen Norris  Pre-Procedure History & Physical: HPI:  Alan Trevino is a 55 y.o. male is here for a screening colonoscopy.   Past Medical History:  Diagnosis Date  . Depression   . Hypertension     Past Surgical History:  Procedure Laterality Date  . ESOPHAGEAL DILATION     stretching  . HERNIA REPAIR    . SHOULDER SURGERY  09/2009    Prior to Admission medications   Medication Sig Start Date End Date Taking? Authorizing Provider  amLODipine-benazepril (LOTREL) 10-20 MG capsule TAKE 1 CAPSULE(S) CAPSULE(S) BY MOUTH DAILY 11/03/15  Yes Juline Patch, MD  baclofen (LIORESAL) 20 MG tablet TAKE 1 TABLET BY MOUTH EVERY DAY-MAKE APPT FOR REFILLS 11/03/15  Yes Juline Patch, MD  buPROPion (WELLBUTRIN XL) 300 MG 24 hr tablet Take 1 tablet by mouth daily. Dr Kasandra Knudsen 10/11/13  Yes Historical Provider, MD  busPIRone (BUSPAR) 15 MG tablet Take 1 tablet by mouth 2 (two) times daily. Dr Kasandra Knudsen   Yes Historical Provider, MD  methylphenidate (RITALIN) 20 MG tablet Take 1 tablet by mouth 2 (two) times daily. Dr Kasandra Knudsen   Yes Historical Provider, MD    Allergies as of 11/10/2015  . (No Known Allergies)    History reviewed. No pertinent family history.  Social History   Social History  . Marital status: Divorced    Spouse name: N/A  . Number of children: N/A  . Years of education: N/A   Occupational History  . Not on file.   Social History Main Topics  . Smoking status: Former Research scientist (life sciences)  . Smokeless tobacco: Never Used  . Alcohol use 7.2 oz/week    12 Cans of beer per week  . Drug use: No  . Sexual activity: Not Currently   Other Topics Concern  . Not on file   Social History Narrative  . No narrative on file    Review of Systems: See HPI, otherwise negative ROS  Physical Exam: BP (!) 158/92   Pulse  65   Temp 97.6 F (36.4 C) (Temporal)   Resp 16   Ht 5\' 4"  (1.626 m)   Wt 148 lb (67.1 kg)   SpO2 99%   BMI 25.40 kg/m  General:   Alert,  pleasant and cooperative in NAD Head:  Normocephalic and atraumatic. Neck:  Supple; no masses or thyromegaly. Lungs:  Clear throughout to auscultation.    Heart:  Regular rate and rhythm. Abdomen:  Soft, nontender and nondistended. Normal bowel sounds, without guarding, and without rebound.   Neurologic:  Alert and  oriented x4;  grossly normal neurologically.  Impression/Plan: Alan Trevino is now here to undergo a screening colonoscopy.  Risks, benefits, and alternatives regarding colonoscopy have been reviewed with the patient.  Questions have been answered.  All parties agreeable.

## 2015-12-29 NOTE — Anesthesia Preprocedure Evaluation (Signed)
Anesthesia Evaluation  Patient identified by MRN, date of birth, ID band Patient awake    Airway Mallampati: II  TM Distance: >3 FB Neck ROM: Full    Dental   Pulmonary former smoker,    Pulmonary exam normal        Cardiovascular hypertension, Pt. on medications Normal cardiovascular exam     Neuro/Psych Depression    GI/Hepatic   Endo/Other    Renal/GU      Musculoskeletal   Abdominal   Peds  Hematology   Anesthesia Other Findings   Reproductive/Obstetrics                             Anesthesia Physical Anesthesia Plan  ASA: II  Anesthesia Plan: MAC   Post-op Pain Management:    Induction: Intravenous  Airway Management Planned: Natural Airway  Additional Equipment:   Intra-op Plan:   Post-operative Plan:   Informed Consent: I have reviewed the patients History and Physical, chart, labs and discussed the procedure including the risks, benefits and alternatives for the proposed anesthesia with the patient or authorized representative who has indicated his/her understanding and acceptance.     Plan Discussed with: CRNA  Anesthesia Plan Comments:         Anesthesia Quick Evaluation

## 2015-12-29 NOTE — Anesthesia Procedure Notes (Signed)
Procedure Name: MAC Performed by: Lyndsey Demos Pre-anesthesia Checklist: Patient identified, Emergency Drugs available, Suction available, Timeout performed and Patient being monitored Patient Re-evaluated:Patient Re-evaluated prior to inductionOxygen Delivery Method: Nasal cannula Placement Confirmation: positive ETCO2     

## 2015-12-29 NOTE — Transfer of Care (Signed)
Immediate Anesthesia Transfer of Care Note  Patient: Alan Trevino  Procedure(s) Performed: Procedure(s): COLONOSCOPY WITH PROPOFOL (N/A) POLYPECTOMY (N/A)  Patient Location: PACU  Anesthesia Type: MAC  Level of Consciousness: awake, alert  and patient cooperative  Airway and Oxygen Therapy: Patient Spontanous Breathing and Patient connected to supplemental oxygen  Post-op Assessment: Post-op Vital signs reviewed, Patient's Cardiovascular Status Stable, Respiratory Function Stable, Patent Airway and No signs of Nausea or vomiting  Post-op Vital Signs: Reviewed and stable  Complications: No apparent anesthesia complications

## 2016-01-01 ENCOUNTER — Encounter: Payer: Self-pay | Admitting: Gastroenterology

## 2016-01-02 ENCOUNTER — Encounter: Payer: Self-pay | Admitting: Gastroenterology

## 2016-05-24 ENCOUNTER — Other Ambulatory Visit: Payer: Self-pay | Admitting: Family Medicine

## 2016-05-24 DIAGNOSIS — M519 Unspecified thoracic, thoracolumbar and lumbosacral intervertebral disc disorder: Secondary | ICD-10-CM

## 2016-06-13 ENCOUNTER — Other Ambulatory Visit: Payer: Self-pay | Admitting: Family Medicine

## 2016-06-13 ENCOUNTER — Encounter: Payer: Self-pay | Admitting: Family Medicine

## 2016-06-13 ENCOUNTER — Ambulatory Visit (INDEPENDENT_AMBULATORY_CARE_PROVIDER_SITE_OTHER): Payer: BLUE CROSS/BLUE SHIELD | Admitting: Family Medicine

## 2016-06-13 VITALS — BP 120/62 | HR 80 | Ht 64.0 in | Wt 163.0 lb

## 2016-06-13 DIAGNOSIS — M5137 Other intervertebral disc degeneration, lumbosacral region: Secondary | ICD-10-CM

## 2016-06-13 DIAGNOSIS — I10 Essential (primary) hypertension: Secondary | ICD-10-CM

## 2016-06-13 DIAGNOSIS — M519 Unspecified thoracic, thoracolumbar and lumbosacral intervertebral disc disorder: Secondary | ICD-10-CM

## 2016-06-13 DIAGNOSIS — N529 Male erectile dysfunction, unspecified: Secondary | ICD-10-CM

## 2016-06-13 DIAGNOSIS — E781 Pure hyperglyceridemia: Secondary | ICD-10-CM

## 2016-06-13 MED ORDER — SILDENAFIL CITRATE 100 MG PO TABS: 50.0000 mg | ORAL_TABLET | Freq: Every day | ORAL | 11 refills | Status: DC | PRN

## 2016-06-13 MED ORDER — CYCLOBENZAPRINE HCL 5 MG PO TABS: 5.0000 mg | ORAL_TABLET | Freq: Every day | ORAL | 0 refills | Status: DC

## 2016-06-13 MED ORDER — AMLODIPINE BESY-BENAZEPRIL HCL 10-20 MG PO CAPS: 1.0000 | ORAL_CAPSULE | Freq: Every day | ORAL | 6 refills | Status: DC

## 2016-06-13 NOTE — Progress Notes (Signed)
Name: Alan Trevino   MRN: [id number]    DOB: 11/23/1960   Date:06/13/2016       Progress Note  Subjective  Chief Complaint  Chief Complaint  Patient presents with  . Hypertension  . Back Pain    employer is worried that Baclofen stays in system longer than 10 hrs- if so, needs something in its place. 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 The current episode started more than 1 year ago. The problem has been gradually improving since onset. The problem is controlled. Pertinent negatives include no anxiety, blurred vision, chest pain, headaches, malaise/fatigue, neck pain, orthopnea, palpitations, peripheral edema, PND, shortness of breath or sweats. Agents associated with hypertension include NSAIDs. Past treatments include ACE inhibitors and calcium channel blockers. The current treatment provides moderate improvement. There are no compliance problems.  There is no history of angina, kidney disease, CAD/MI, CVA, heart failure, left ventricular hypertrophy, PVD, renovascular disease or retinopathy. There is no history of chronic renal disease or a hypertension causing med.  Back Pain  This is a chronic problem. The current episode started more than 1 year ago. The problem occurs intermittently. The problem has been waxing and waning since onset. The pain is present in the lumbar spine. The quality of the pain is described as aching. The pain is at a severity of 0/10. The pain is moderate. Pertinent negatives include no abdominal pain, bladder incontinence, bowel incontinence, chest pain, dysuria, fever, headaches, numbness, paresis, paresthesias, tingling or weight loss. He has tried NSAIDs for the symptoms. The treatment provided mild relief.  Erectile Dysfunction  This is a chronic problem. The problem has been gradually worsening since onset. The nature of his difficulty is maintaining  erection and achieving erection. He reports no anxiety. Irritative symptoms do not include frequency, nocturia or urgency. Obstructive symptoms do not include incomplete emptying, an intermittent stream, a slower stream, straining or a weak stream. Pertinent negatives include no chills, dysuria or hematuria. Past treatments include nothing.    No problem-specific Assessment & Plan notes found for this encounter.   Past Medical History:  Diagnosis Date  . Depression   . Hypertension     Past Surgical History:  Procedure Laterality Date  . COLONOSCOPY WITH PROPOFOL N/A 12/29/2015   Procedure: COLONOSCOPY WITH PROPOFOL;  Surgeon: [name], MD;  Location: [place];  Service: Endoscopy;  Laterality: N/A;  . ESOPHAGEAL DILATION     stretching  . HERNIA REPAIR    . POLYPECTOMY N/A 12/29/2015   Procedure: POLYPECTOMY;  Surgeon: [name], MD;  Location: [place];  Service: Endoscopy;  Laterality: N/A;  . SHOULDER SURGERY  09/2009    No family history on file.  Social History   Social History  . Marital status: Divorced    Spouse name: N/A  . Number of children: N/A  . Years of education: N/A   Occupational History  . Not on file.   Social History Main Topics  . Smoking status: Former Research scientist (life sciences)  . Smokeless tobacco: Never Used  . Alcohol use 7.2 oz/week    12 Cans of beer per week  . Drug use: No  . Sexual activity: Not Currently   Other Topics Concern  . Not on file   Social History Narrative  . No narrative on file    No Known Allergies  Outpatient Medications Prior to  Visit  Medication Sig Dispense Refill  . buPROPion (WELLBUTRIN XL) 300 MG 24 hr tablet Take 1 tablet by mouth daily. Dr Kasandra Knudsen    . busPIRone (BUSPAR) 15 MG tablet Take 1 tablet by mouth 2 (two) times daily. Dr Kasandra Knudsen    . methylphenidate (RITALIN) 20 MG tablet Take 1 tablet by mouth 2 (two) times daily. Dr Kasandra Knudsen    . amLODipine-benazepril (LOTREL) 10-20 MG capsule TAKE 1 CAPSULE(S)  CAPSULE(S) BY MOUTH DAILY 30 capsule 0  . baclofen (LIORESAL) 20 MG tablet TAKE 1 TABLET BY MOUTH EVERY DAY-MAKE APPT FOR REFILLS 15 tablet 0   No facility-administered medications prior to visit.     Review of Systems  Constitutional: Negative for chills, fever, malaise/fatigue and weight loss.  HENT: Negative for ear discharge, ear pain and sore throat.   Eyes: Negative for blurred vision.  Respiratory: Negative for cough, sputum production, shortness of breath and wheezing.   Cardiovascular: Negative for chest pain, palpitations, orthopnea, leg swelling and PND.  Gastrointestinal: Negative for abdominal pain, blood in stool, bowel incontinence, constipation, diarrhea, heartburn, melena and nausea.  Genitourinary: Negative for bladder incontinence, dysuria, frequency, hematuria, incomplete emptying, nocturia and urgency.  Musculoskeletal: Positive for back pain. Negative for joint pain, myalgias and neck pain.  Skin: Negative for rash.  Neurological: Negative for dizziness, tingling, sensory change, focal weakness, numbness, headaches and paresthesias.  Endo/Heme/Allergies: Negative for environmental allergies and polydipsia. Does not bruise/bleed easily.  Psychiatric/Behavioral: Negative for depression and suicidal ideas. The patient is not nervous/anxious and does not have insomnia.      Objective  Vitals:   06/13/16 1040  BP: 120/62  Pulse: 80  Weight: 163 lb (73.9 kg)  Height: 5\' 4"  (1.626 m)    Physical Exam  Constitutional: He is oriented to person, place, and time and well-developed, well-nourished, and in no distress.  HENT:  Head: Normocephalic.  Right Ear: External ear normal.  Left Ear: External ear normal.  Nose: Nose normal.  Mouth/Throat: Oropharynx is clear and moist.  Eyes: Conjunctivae and EOM are normal. Pupils are equal, round, and reactive to light. Right eye exhibits no discharge. Left eye exhibits no discharge. No scleral icterus.  Neck: Normal range  of motion. Neck supple. No JVD present. No tracheal deviation present. No thyromegaly present.  Cardiovascular: Normal rate, regular rhythm, normal heart sounds and intact distal pulses.  Exam reveals no gallop and no friction rub.   No murmur heard. Pulmonary/Chest: Breath sounds normal. No respiratory distress. He has no wheezes. He has no rales.  Abdominal: Soft. Bowel sounds are normal. He exhibits no mass. There is no hepatosplenomegaly. There is no tenderness. There is no rebound, no guarding and no CVA tenderness.  Musculoskeletal: Normal range of motion. He exhibits no edema or tenderness.  Lymphadenopathy:    He has no cervical adenopathy.  Neurological: He is alert and oriented to person, place, and time. He has normal sensation, normal strength and intact cranial nerves. No cranial nerve deficit.  Skin: Skin is warm. No rash noted.  Psychiatric: Mood and affect normal.  Nursing note and vitals reviewed.     Assessment & Plan  Problem List Items Addressed This Visit      Cardiovascular and Mediastinum   Essential (primary) hypertension - Primary   Relevant Medications   amLODipine-benazepril (LOTREL) 10-20 MG capsule   sildenafil (VIAGRA) 100 MG tablet   Other Relevant Orders   Renal Function Panel     Musculoskeletal and Integument   DDD (degenerative  disc disease), lumbosacral   Relevant Medications   cyclobenzaprine (FLEXERIL) 5 MG tablet   Lumbar disc disease     Other   HLD (hyperlipidemia)   Relevant Medications   amLODipine-benazepril (LOTREL) 10-20 MG capsule   sildenafil (VIAGRA) 100 MG tablet   Other Relevant Orders   Lipid Profile    Other Visit Diagnoses    Vasculogenic erectile dysfunction, unspecified vasculogenic erectile dysfunction type          Meds ordered this encounter  Medications  . amLODipine-benazepril (LOTREL) 10-20 MG capsule    Sig: Take 1 capsule by mouth daily.    Dispense:  30 capsule    Refill:  6  . sildenafil (VIAGRA)  100 MG tablet    Sig: Take 0.5-1 tablets (50-100 mg total) by mouth daily as needed for erectile dysfunction.    Dispense:  5 tablet    Refill:  11  . cyclobenzaprine (FLEXERIL) 5 MG tablet    Sig: Take 1 tablet (5 mg total) by mouth at bedtime. Not to be taken if driving next day    Dispense:  30 tablet    Refill:  0      Dr. Otilio Miu [facility]  06/13/16

## 2016-06-14 ENCOUNTER — Ambulatory Visit: Payer: Self-pay | Admitting: Family Medicine

## 2016-06-14 LAB — RENAL FUNCTION PANEL
Albumin: 4.5 g/dL (ref 3.5–5.5)
BUN/Creatinine Ratio: 12 (ref 9–20)
BUN: 13 mg/dL (ref 6–24)
CO2: 28 mmol/L (ref 18–29)
Calcium: 9.4 mg/dL (ref 8.7–10.2)
Chloride: 99 mmol/L (ref 96–106)
Creatinine, Ser: 1.11 mg/dL (ref 0.76–1.27)
GFR calc Af Amer: 85 mL/min/{1.73_m2} (ref >59)
GFR calc non Af Amer: 74 mL/min/{1.73_m2} (ref >59)
Glucose: 89 mg/dL (ref 65–99)
Phosphorus: 2.7 mg/dL (ref 2.5–4.5)
Potassium: 4.3 mmol/L (ref 3.5–5.2)
Sodium: 140 mmol/L (ref 134–144)

## 2016-06-14 LAB — LIPID PANEL
Chol/HDL Ratio: 4.7 ratio units (ref 0.0–5.0)
Cholesterol, Total: 242 mg/dL — ABNORMAL HIGH (ref 100–199)
HDL: 51 mg/dL (ref >39)
LDL Calculated: 159 mg/dL — ABNORMAL HIGH (ref 0–99)
Triglycerides: 160 mg/dL — ABNORMAL HIGH (ref 0–149)
VLDL Cholesterol Cal: 32 mg/dL (ref 5–40)

## 2016-06-17 ENCOUNTER — Encounter: Payer: Self-pay | Admitting: *Deleted

## 2016-06-17 ENCOUNTER — Ambulatory Visit
Admission: EM | Admit: 2016-06-17 | Discharge: 2016-06-17 | Disposition: A | Discharge status: Home / Self Care | Payer: BLUE CROSS/BLUE SHIELD | Attending: Family Medicine | Admitting: Family Medicine

## 2016-06-17 DIAGNOSIS — Z0289 Encounter for other administrative examinations: Secondary | ICD-10-CM

## 2016-06-17 LAB — DEPT OF TRANSP DIPSTICK, URINE (ARMC ONLY)
Glucose, UA: NEGATIVE mg/dL
Protein, ur: NEGATIVE mg/dL
Specific Gravity, Urine: 1.01 (ref 1.005–1.030)

## 2016-06-17 NOTE — ED Provider Notes (Signed)
[facility]    CSN: [id number] Arrival date & time: 06/17/16  0825     History   Chief Complaint Chief Complaint  Patient presents with  . Commercial Driver's License Exam    HPI Alan Trevino is a 56 y.o. male.   Patient here for DOT Physical (see scanned form)   The history is provided by the patient.    Past Medical History:  Diagnosis Date  . Depression   . Hypertension     Patient Active Problem List   Diagnosis Date Noted  . Special screening for malignant neoplasms, colon   . Benign neoplasm of ascending colon   . Benign neoplasm of transverse colon   . Benign neoplasm of descending colon   . Rectal polyp   . Lumbar disc disease 11/03/2015  . Iron deficiency anemia 08/25/2014  . Acute gastrointestinal bleeding 08/25/2014  . Acute back pain with sciatica 08/25/2014  . Routine general medical examination at a health care facility 08/25/2014  . 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 Essential (primary) hypertension 08/25/2014  . HLD (hyperlipidemia) 08/25/2014    Past Surgical History:  Procedure Laterality Date  . COLONOSCOPY WITH PROPOFOL N/A 12/29/2015   Procedure: COLONOSCOPY WITH PROPOFOL;  Surgeon: [name], MD;  Location: [place];  Service: Endoscopy;  Laterality: N/A;  . ESOPHAGEAL DILATION     stretching  . HERNIA REPAIR    . POLYPECTOMY N/A 12/29/2015   Procedure: POLYPECTOMY;  Surgeon: [name], MD;  Location: [place];  Service: Endoscopy;  Laterality: N/A;  . SHOULDER SURGERY  09/2009       Home Medications    Prior to Admission medications   Medication Sig Start Date End Date Taking? Authorizing Provider  amLODipine-benazepril (LOTREL) 10-20 MG capsule Take 1 capsule by mouth daily. 06/13/16  Yes Juline Patch, MD  buPROPion (WELLBUTRIN XL) 300 MG 24 hr tablet Take 1 tablet by mouth daily. Dr Kasandra Knudsen 10/11/13  Yes  Historical Provider, MD  busPIRone (BUSPAR) 15 MG tablet Take 1 tablet by mouth 2 (two) times daily. Dr Kasandra Knudsen   Yes Historical Provider, MD  cyclobenzaprine (FLEXERIL) 5 MG tablet Take 1 tablet (5 mg total) by mouth at bedtime. Not to be taken if driving next day [id number]  Yes Juline Patch, MD  methylphenidate (RITALIN) 20 MG tablet Take 1 tablet by mouth 2 (two) times daily. Dr Kasandra Knudsen   Yes Historical Provider, MD  sildenafil (VIAGRA) 100 MG tablet Take 0.5-1 tablets (50-100 mg total) by mouth daily as needed for erectile dysfunction. 06/13/16   Juline Patch, MD    Family History History reviewed. No pertinent family history.  Social History Social History  Substance Use Topics  . Smoking status: Former Research scientist (life sciences)  . Smokeless tobacco: Current User    Types: Snuff  . Alcohol use 7.2 oz/week    12 Cans of beer per week     Allergies   Patient has no known allergies.   Review of Systems Review of Systems   Physical Exam Triage Vital Signs ED Triage Vitals  Enc Vitals Group     BP 06/17/16 0845 136/78     Pulse Rate 06/17/16 0845 72     Resp 06/17/16 0845 16     Temp 06/17/16 0845 98.4 F (36.9 C)     Temp Source 06/17/16 0845 Oral     SpO2 06/17/16 0845 99 %  Weight 06/17/16 0851 166 lb (75.3 kg)     Height 06/17/16 0851 5\' 4"  (1.626 m)     Head Circumference --      Peak Flow --      Pain Score --      Pain Loc --      Pain Edu? --      Excl. in [place]? --    No data found.   Updated Vital Signs BP 136/78 (BP Location: Left Arm)   Pulse 72   Temp 98.4 F (36.9 C) (Oral)   Resp 16   Ht 5\' 4"  (1.626 m)   Wt 166 lb (75.3 kg)   SpO2 99%   BMI 28.49 kg/m   Visual Acuity Right Eye Distance:   Left Eye Distance:   Bilateral Distance:    Right Eye Near:   Left Eye Near:    Bilateral Near:     Physical Exam   UC Treatments / Results  Labs (all labs ordered are listed, but only abnormal results are displayed) Labs Reviewed  DEPT OF TRANSP DIPSTICK,  URINE([facility] ONLY) - Abnormal; Notable for the following:       Result Value   Hgb urine dipstick TRACE (*)    All other components within normal limits    EKG  EKG Interpretation None       Radiology No results found.  Procedures Procedures (including critical care time)  Medications Ordered in UC Medications - No data to display   Initial Impression / Assessment and Plan / UC Course  I have reviewed the triage vital signs and the nursing notes.  Pertinent labs & imaging results that were available during my care of the patient were reviewed by me and considered in my medical decision making (see chart for details).       Final Clinical Impressions(s) / UC Diagnoses   Final diagnoses:  Encounter for examination required by Department of Transportation (DOT)    New Prescriptions New Prescriptions   No medications on file   DOT Physical (medically qualified for 1 year; see scanned form)   Norval Gable, MD 06/17/16 [id number]

## 2016-06-17 NOTE — ED Triage Notes (Signed)
DOT physical. 

## 2016-07-16 ENCOUNTER — Other Ambulatory Visit: Payer: Self-pay | Admitting: Family Medicine

## 2016-08-12 ENCOUNTER — Other Ambulatory Visit: Payer: Self-pay | Admitting: Family Medicine

## 2016-09-07 ENCOUNTER — Ambulatory Visit
Admission: EM | Admit: 2016-09-07 | Discharge: 2016-09-07 | Disposition: A | Discharge status: Home / Self Care | Payer: BLUE CROSS/BLUE SHIELD | Attending: Emergency Medicine | Admitting: Emergency Medicine

## 2016-09-07 ENCOUNTER — Encounter: Payer: Self-pay | Admitting: Emergency Medicine

## 2016-09-07 DIAGNOSIS — J0101 Acute recurrent maxillary sinusitis: Secondary | ICD-10-CM | POA: Diagnosis not present

## 2016-09-07 MED ORDER — ACETAMINOPHEN 325 MG PO TABS: 650.0000 mg | ORAL_TABLET | Freq: Four times a day (QID) | ORAL | Status: DC | PRN

## 2016-09-07 MED ORDER — DOXYCYCLINE HYCLATE 100 MG PO CAPS: 100.0000 mg | ORAL_CAPSULE | Freq: Two times a day (BID) | ORAL | 0 refills | Status: DC

## 2016-09-07 NOTE — ED Provider Notes (Signed)
CSN: [id number]     Arrival date & time 09/07/16  1404 History   First MD Initiated Contact with Patient 09/07/16 1440     Chief Complaint  Patient presents with  . Facial Pain   (Consider location/radiation/quality/duration/timing/severity/associated sxs/prior Treatment) The history is provided by the patient. No language interpreter was used.  URI  Presenting symptoms: congestion, cough, facial pain and fever   Severity:  Moderate Onset quality:  Sudden Timing:  Constant Progression:  Worsening Chronicity:  Recurrent (last episode was last year) Relieved by:  Nothing Exacerbated by: leaning forward. 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 Depression   . Hypertension    Past Surgical History:  Procedure Laterality Date  . COLONOSCOPY WITH PROPOFOL N/A 12/29/2015   Procedure: COLONOSCOPY WITH PROPOFOL;  Surgeon: [name], MD;  Location: [place];  Service: Endoscopy;  Laterality: N/A;  . ESOPHAGEAL DILATION     stretching  . HERNIA REPAIR    . POLYPECTOMY N/A 12/29/2015   Procedure: POLYPECTOMY;  Surgeon: [name], MD;  Location: [facility] [place];  Service: Endoscopy;  Laterality: N/A;  . SHOULDER SURGERY  09/2009   No family history on file. Social History  Substance Use Topics  . Smoking status: Former Research scientist (life sciences)  . Smokeless tobacco: Current User    Types: Snuff  . Alcohol use 7.2 oz/week    12 Cans of beer per week    Review of Systems  Constitutional: Positive for activity change and fever.  HENT: Positive for congestion, sinus pain and sinus pressure.   Eyes: Negative.   Respiratory: Positive for cough.   Cardiovascular: Negative.   Genitourinary: Negative.   Skin: Negative.   Hematological: Negative.   Psychiatric/Behavioral: Negative.   All other systems reviewed and are  negative.   Allergies  Patient has no known allergies.  Home Medications   Prior to Admission medications   Medication Sig Start Date End Date Taking? Authorizing Provider  amLODipine-benazepril (LOTREL) 10-20 MG capsule Take 1 capsule by mouth daily. 06/13/16  Yes Juline Patch, MD  buPROPion (WELLBUTRIN XL) 300 MG 24 hr tablet Take 1 tablet by mouth daily. Dr Kasandra Knudsen 10/11/13  Yes [provider]  busPIRone (BUSPAR) 15 MG tablet Take 1 tablet by mouth 2 (two) times daily. Dr Kasandra Knudsen   Yes [provider]  cyclobenzaprine (FLEXERIL) 5 MG tablet TAKE 1 TABLET (5 MG TOTAL) BY MOUTH AT BEDTIME. NOT TO BE TAKEN IF DRIVING NEXT DAY 06/12/23  Yes Juline Patch, MD  methylphenidate (RITALIN) 20 MG tablet Take 1 tablet by mouth 2 (two) times daily. Dr Kasandra Knudsen   Yes [provider]  sildenafil (VIAGRA) 100 MG tablet Take 0.5-1 tablets (50-100 mg total) by mouth daily as needed for erectile dysfunction. 06/13/16  Yes Juline Patch, MD  doxycycline (VIBRAMYCIN) 100 MG capsule Take 1 capsule (100 mg total) by mouth 2 (two) times daily. 08/16/04   Whittney Steenson, Jeanett Schlein, NP   Meds Ordered and Administered this Visit   Medications  acetaminophen (TYLENOL) tablet 650 mg (not administered)    BP 130/75 (BP Location: Left Arm)   Pulse 88   Temp 100.2 F (37.9 C) (Oral)   Resp 18   Ht 5\' 3"  (1.6 m)   Wt 165 lb (74.8 kg)   SpO2 98%   BMI 29.23 kg/m  No data found.   Physical Exam  Constitutional: He is oriented to person, place, and time. He appears well-developed and well-nourished. He is active and cooperative.  Non-toxic appearance. He does not have a sickly appearance. He does not appear ill. No distress.  HENT:  Head: Normocephalic.  Right Ear: Tympanic membrane is retracted.  Left Ear: No tenderness. Tympanic membrane is retracted.  Nose: Mucosal edema and sinus tenderness present. Right sinus exhibits maxillary sinus tenderness. Left sinus exhibits maxillary sinus tenderness.   Mouth/Throat: Uvula is midline, oropharynx is clear and moist and mucous membranes are normal.  + thick yellow drainage  Eyes: Conjunctivae, EOM and lids are normal. Pupils are equal, round, and reactive to light.  Neck: Normal range of motion.  Cardiovascular: Normal rate, regular rhythm, normal heart sounds and normal pulses.   Pulmonary/Chest: Effort normal and breath sounds normal. No respiratory distress. He has no decreased breath sounds. He has no wheezes.  Abdominal: Soft. He exhibits no distension.  Musculoskeletal: Normal range of motion.  Neurological: He is alert and oriented to person, place, and time. No cranial nerve deficit or sensory deficit. GCS eye subscore is 4. GCS verbal subscore is 5. GCS motor subscore is 6.  Skin: Skin is warm, dry and intact. No rash noted.  Psychiatric: He has a normal mood and affect. His speech is normal and behavior is normal.  Nursing note and vitals reviewed.   Urgent Care Course     Procedures (including critical care time)  Labs Review Labs Reviewed - No data to display  Imaging Review No results found.        MDM   1. Acute recurrent maxillary sinusitis    Rest,push fluids, take meds as directed(doxycycline). Follow up with PCP for recheck. Return to UC as needed. Flonase nasal spray for s/s management.Tylenol 650 mg po for fever.    Tori Milks, NP 02/72/53 1521

## 2016-09-07 NOTE — Discharge Instructions (Signed)
Rest,push fluids, take meds as directed(doxycycline). Follow up with PCP for recheck. Return to UC as needed. Flonase nasal spray for s/s management.Tylenol 650 mg po for fever.

## 2016-09-07 NOTE — ED Triage Notes (Signed)
Facial pain, headache, cough, congestion, runny nose since Thursday

## 2016-09-12 ENCOUNTER — Other Ambulatory Visit: Payer: Self-pay | Admitting: Family Medicine

## 2016-10-09 ENCOUNTER — Other Ambulatory Visit: Payer: Self-pay | Admitting: Family Medicine

## 2016-10-11 ENCOUNTER — Other Ambulatory Visit: Payer: Self-pay

## 2016-10-11 MED ORDER — BACLOFEN 10 MG PO TABS: 10.0000 mg | ORAL_TABLET | Freq: Every day | ORAL | 0 refills | Status: DC

## 2016-11-08 ENCOUNTER — Other Ambulatory Visit: Payer: Self-pay | Admitting: Family Medicine

## 2016-11-28 ENCOUNTER — Inpatient Hospital Stay
Admission: EM | Admit: 2016-11-28 | Discharge: 2016-11-30 | DRG: 378 | Disposition: A | Discharge status: Home / Self Care | Payer: BLUE CROSS/BLUE SHIELD | Attending: Internal Medicine | Admitting: Internal Medicine

## 2016-11-28 ENCOUNTER — Emergency Department: Payer: BLUE CROSS/BLUE SHIELD

## 2016-11-28 ENCOUNTER — Encounter: Payer: Self-pay | Admitting: Emergency Medicine

## 2016-11-28 DIAGNOSIS — K449 Diaphragmatic hernia without obstruction or gangrene: Secondary | ICD-10-CM | POA: Diagnosis present

## 2016-11-28 DIAGNOSIS — F419 Anxiety disorder, unspecified: Secondary | ICD-10-CM | POA: Diagnosis present

## 2016-11-28 DIAGNOSIS — K920 Hematemesis: Secondary | ICD-10-CM

## 2016-11-28 DIAGNOSIS — F329 Major depressive disorder, single episode, unspecified: Secondary | ICD-10-CM | POA: Diagnosis present

## 2016-11-28 DIAGNOSIS — K253 Acute gastric ulcer without hemorrhage or perforation: Secondary | ICD-10-CM | POA: Diagnosis not present

## 2016-11-28 DIAGNOSIS — K222 Esophageal obstruction: Secondary | ICD-10-CM | POA: Diagnosis present

## 2016-11-28 DIAGNOSIS — Z79899 Other long term (current) drug therapy: Secondary | ICD-10-CM

## 2016-11-28 DIAGNOSIS — K254 Chronic or unspecified gastric ulcer with hemorrhage: Secondary | ICD-10-CM | POA: Diagnosis present

## 2016-11-28 DIAGNOSIS — Z720 Tobacco use: Secondary | ICD-10-CM | POA: Diagnosis not present

## 2016-11-28 DIAGNOSIS — K921 Melena: Secondary | ICD-10-CM

## 2016-11-28 DIAGNOSIS — R109 Unspecified abdominal pain: Secondary | ICD-10-CM

## 2016-11-28 DIAGNOSIS — I10 Essential (primary) hypertension: Secondary | ICD-10-CM | POA: Diagnosis present

## 2016-11-28 DIAGNOSIS — N289 Disorder of kidney and ureter, unspecified: Secondary | ICD-10-CM

## 2016-11-28 DIAGNOSIS — K922 Gastrointestinal hemorrhage, unspecified: Secondary | ICD-10-CM

## 2016-11-28 DIAGNOSIS — N179 Acute kidney failure, unspecified: Secondary | ICD-10-CM | POA: Diagnosis present

## 2016-11-28 HISTORY — DX: Personal history of other specified conditions: Z87.898

## 2016-11-28 HISTORY — DX: Gastrointestinal hemorrhage, unspecified: K92.2

## 2016-11-28 HISTORY — DX: Unspecified intestinal obstruction, unspecified as to partial versus complete obstruction: K56.609

## 2016-11-28 LAB — CBC WITH DIFFERENTIAL/PLATELET
Basophils Absolute: 0.1 10*3/uL (ref 0–0.1)
Basophils Relative: 0 %
Eosinophils Absolute: 0.1 10*3/uL (ref 0–0.7)
Eosinophils Relative: 1 %
HCT: 38 % — ABNORMAL LOW (ref 40.0–52.0)
Hemoglobin: 12.8 g/dL — ABNORMAL LOW (ref 13.0–18.0)
Lymphocytes Relative: 8 %
Lymphs Abs: 1.2 10*3/uL (ref 1.0–3.6)
MCH: 31.9 pg (ref 26.0–34.0)
MCHC: 33.7 g/dL (ref 32.0–36.0)
MCV: 94.7 fL (ref 80.0–100.0)
Monocytes Absolute: 0.9 10*3/uL (ref 0.2–1.0)
Monocytes Relative: 6 %
Neutro Abs: 13.9 10*3/uL — ABNORMAL HIGH (ref 1.4–6.5)
Neutrophils Relative %: 85 %
Platelets: 234 10*3/uL (ref 150–440)
RBC: 4.02 MIL/uL — ABNORMAL LOW (ref 4.40–5.90)
RDW: 12.6 % (ref 11.5–14.5)
WBC: 16.2 10*3/uL — ABNORMAL HIGH (ref 3.8–10.6)

## 2016-11-28 LAB — URINALYSIS, COMPLETE (UACMP) WITH MICROSCOPIC
Bacteria, UA: NONE SEEN
Bilirubin Urine: NEGATIVE
Glucose, UA: NEGATIVE mg/dL
Hgb urine dipstick: NEGATIVE
Ketones, ur: 5 mg/dL — AB
Leukocytes, UA: NEGATIVE
Nitrite: NEGATIVE
Protein, ur: 30 mg/dL — AB
Specific Gravity, Urine: 1.028 (ref 1.005–1.030)
Squamous Epithelial / HPF: NONE SEEN
pH: 5 (ref 5.0–8.0)

## 2016-11-28 LAB — COMPREHENSIVE METABOLIC PANEL
ALT: 22 U/L (ref 17–63)
AST: 27 U/L (ref 15–41)
Albumin: 3.8 g/dL (ref 3.5–5.0)
Alkaline Phosphatase: 44 U/L (ref 38–126)
Anion gap: 9 (ref 5–15)
BUN: 36 mg/dL — ABNORMAL HIGH (ref 6–20)
CO2: 21 mmol/L — ABNORMAL LOW (ref 22–32)
Calcium: 8.5 mg/dL — ABNORMAL LOW (ref 8.9–10.3)
Chloride: 107 mmol/L (ref 101–111)
Creatinine, Ser: 1.96 mg/dL — ABNORMAL HIGH (ref 0.61–1.24)
GFR calc Af Amer: 42 mL/min — ABNORMAL LOW (ref >60)
GFR calc non Af Amer: 36 mL/min — ABNORMAL LOW (ref >60)
Glucose, Bld: 146 mg/dL — ABNORMAL HIGH (ref 65–99)
Potassium: 4 mmol/L (ref 3.5–5.1)
Sodium: 137 mmol/L (ref 135–145)
Total Bilirubin: 1.1 mg/dL (ref 0.3–1.2)
Total Protein: 6.6 g/dL (ref 6.5–8.1)

## 2016-11-28 LAB — TYPE AND SCREEN
ABO/RH(D): O NEG
Antibody Screen: NEGATIVE

## 2016-11-28 LAB — TROPONIN I: Troponin I: <0.03 ng/mL

## 2016-11-28 LAB — HEMOGLOBIN: Hemoglobin: 13.2 g/dL (ref 13.0–18.0)

## 2016-11-28 LAB — LIPASE, BLOOD: Lipase: 29 U/L (ref 11–51)

## 2016-11-28 MED ORDER — SODIUM CHLORIDE 0.9 % IV SOLN
INTRAVENOUS | Status: DC
  Administered 2016-11-28 – 2016-11-30 (×3): via INTRAVENOUS

## 2016-11-28 MED ORDER — BACLOFEN 10 MG PO TABS
10.0000 mg | ORAL_TABLET | Freq: Every day | ORAL | Status: DC | PRN
  Filled 2016-11-28: qty 1

## 2016-11-28 MED ORDER — SODIUM CHLORIDE 0.9 % IV SOLN
8.0000 mg/h | INTRAVENOUS | Status: AC
  Administered 2016-11-28 – 2016-11-29 (×3): 8 mg/h via INTRAVENOUS
  Filled 2016-11-28 (×4): qty 80

## 2016-11-28 MED ORDER — BUSPIRONE HCL 15 MG PO TABS
15.0000 mg | ORAL_TABLET | Freq: Two times a day (BID) | ORAL | Status: DC
  Administered 2016-11-28 – 2016-11-30 (×3): 15 mg via ORAL
  Filled 2016-11-28 (×2): qty 1
  Filled 2016-11-28 (×3): qty 3

## 2016-11-28 MED ORDER — SODIUM CHLORIDE 0.9 % IV BOLUS (SEPSIS): 2000.0000 mL | Freq: Once | INTRAVENOUS | Status: DC

## 2016-11-28 MED ORDER — BUPROPION HCL ER (XL) 300 MG PO TB24
300.0000 mg | ORAL_TABLET | Freq: Every day | ORAL | Status: DC
  Administered 2016-11-30: 09:00:00 300 mg via ORAL
  Filled 2016-11-28: qty 2
  Filled 2016-11-28: qty 1

## 2016-11-28 MED ORDER — ACETAMINOPHEN 325 MG PO TABS
650.0000 mg | ORAL_TABLET | Freq: Four times a day (QID) | ORAL | Status: DC | PRN
  Administered 2016-11-29: 650 mg via ORAL
  Filled 2016-11-28: qty 2

## 2016-11-28 MED ORDER — SODIUM CHLORIDE 0.9 % IV SOLN
80.0000 mg | Freq: Once | INTRAVENOUS | Status: AC
  Administered 2016-11-28: 80 mg via INTRAVENOUS
  Filled 2016-11-28: qty 80

## 2016-11-28 MED ORDER — ACETAMINOPHEN 650 MG RE SUPP: 650.0000 mg | Freq: Four times a day (QID) | RECTAL | Status: DC | PRN

## 2016-11-28 MED ORDER — METHYLPHENIDATE HCL 5 MG PO TABS
20.0000 mg | ORAL_TABLET | Freq: Two times a day (BID) | ORAL | Status: DC
  Administered 2016-11-29 – 2016-11-30 (×3): 20 mg via ORAL
  Filled 2016-11-28 (×3): qty 4

## 2016-11-28 MED ORDER — PANTOPRAZOLE SODIUM 40 MG IV SOLR: 40.0000 mg | Freq: Two times a day (BID) | INTRAVENOUS | Status: DC

## 2016-11-28 MED ORDER — SODIUM CHLORIDE 0.9 % IV BOLUS (SEPSIS)
1000.0000 mL | Freq: Once | INTRAVENOUS | Status: AC
  Administered 2016-11-28: 1000 mL via INTRAVENOUS

## 2016-11-28 NOTE — ED Notes (Signed)
Admission MD at bedside.  

## 2016-11-28 NOTE — ED Provider Notes (Signed)
[facility] Emergency Department Provider Note  ____________________________________________   First MD Initiated Contact with Patient 11/28/16 1304     (approximate)  I have reviewed the triage vital signs and the nursing notes.   HISTORY  Chief Complaint Abdominal Pain   HPI Yarnell Arvidson is a 56 y.o. male with a history of small bowel obstruction as well as rectal bleeding who is presenting to the emergency department today with near syncope as well as upper abdominal pain prior to arrival. He says that he had cramping upper abdominal pain that he rates now is a 2-3 out of 10. He said that he became lightheaded and because of the symptoms his supervisor requested that he presented to the emergency department. He denies any nausea or vomiting. Denies any chest pain or shortness of breath. Denies any blood recently in his stool. He said that his previous bleed was from a "ulcer."Initial blood pressure was 90 systolic per EMS. Improved with fluids.   Past Medical History:  Diagnosis Date  . Depression   . Hypertension   . SBO (small bowel obstruction) [facility])     Patient Active Problem List   Diagnosis Date Noted  . Acute recurrent maxillary sinusitis 09/07/2016  . Special screening for malignant neoplasms, colon   . Benign neoplasm of ascending colon   . Benign neoplasm of transverse colon   . Benign neoplasm of descending colon   . Rectal polyp   . Lumbar disc disease 11/03/2015  . Iron deficiency anemia 08/25/2014  . Acute gastrointestinal bleeding 08/25/2014  . Acute back pain with sciatica 08/25/2014  . Routine general medical examination at a health care facility 08/25/2014  . 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 Recurrent major depressive episodes ([place]) 08/25/2014  . Essential (primary) hypertension 08/25/2014  . HLD (hyperlipidemia) 08/25/2014    Past Surgical History:  Procedure Laterality Date  .  COLONOSCOPY WITH PROPOFOL N/A 12/29/2015   Procedure: COLONOSCOPY WITH PROPOFOL;  Surgeon: [name], MD;  Location: [place];  Service: Endoscopy;  Laterality: N/A;  . ESOPHAGEAL DILATION     stretching  . HERNIA REPAIR    . POLYPECTOMY N/A 12/29/2015   Procedure: POLYPECTOMY;  Surgeon: [name], MD;  Location: [place];  Service: Endoscopy;  Laterality: N/A;  . SHOULDER SURGERY  09/2009    Prior to Admission medications   Medication Sig Start Date End Date Taking? Authorizing Provider  acetaminophen (TYLENOL) 500 MG tablet Take 500 mg by mouth every 6 (six) hours as needed.   Yes [provider]  amLODipine-benazepril (LOTREL) 10-20 MG capsule Take 1 capsule by mouth daily. 06/13/16  Yes Juline Patch, MD  baclofen (LIORESAL) 10 MG tablet TAKE 1 TABLET BY MOUTH EVERY DAY 11/08/16  Yes Juline Patch, MD  buPROPion (WELLBUTRIN XL) 300 MG 24 hr tablet Take 1 tablet by mouth daily. Dr Kasandra Knudsen 10/11/13  Yes [provider]  busPIRone (BUSPAR) 15 MG tablet Take 1 tablet by mouth 2 (two) times daily. Dr Kasandra Knudsen   Yes [provider]  methylphenidate (RITALIN) 20 MG tablet Take 1 tablet by mouth 2 (two) times daily. Dr Kasandra Knudsen   Yes [provider]  doxycycline (VIBRAMYCIN) 100 MG capsule Take 1 capsule (100 mg total) by mouth 2 (two) times daily. Patient not taking: Reported on 07/29/1854 07/04/95   Defelice, Jeanett Schlein, NP  sildenafil (VIAGRA) 100 MG tablet Take 0.5-1 tablets (50-100 mg total) by mouth daily as needed for erectile  dysfunction. Patient not taking: Reported on 11/28/2016 06/13/16   Juline Patch, MD    Allergies Patient has no known allergies.  No family history on file.  Social History Social History  Substance Use Topics  . Smoking status: Former Research scientist (life sciences)  . Smokeless tobacco: Current User    Types: Snuff  . Alcohol use 7.2 oz/week    12 Cans of beer per week    Review of Systems  Constitutional: No fever/chills Eyes: No  visual changes. ENT: No sore throat. Cardiovascular: Denies chest pain. Respiratory: Denies shortness of breath. Gastrointestinal: No nausea, no vomiting.  No diarrhea.  No constipation. Genitourinary: Negative for dysuria. Musculoskeletal: Negative for back pain. Skin: Negative for rash. Neurological: Negative for headaches, focal weakness or numbness.   ____________________________________________   PHYSICAL EXAM:  VITAL SIGNS: ED Triage Vitals  Enc Vitals Group     BP 11/28/16 1304 (!) 114/50     Pulse Rate 11/28/16 1304 91     Resp 11/28/16 1304 16     Temp 11/28/16 1304 97.9 F (36.6 C)     Temp Source 11/28/16 1304 Oral     SpO2 11/28/16 1304 96 %     Weight 11/28/16 1305 170 lb (77.1 kg)     Height 11/28/16 1305 5\' 3"  (1.6 m)     Head Circumference --      Peak Flow --      Pain Score 11/28/16 1304 2     Pain Loc --      Pain Edu? --      Excl. in [place]? --     Constitutional: Alert and oriented. Well appearing and in no acute distress. Eyes: Conjunctivae are normal.  Head: Atraumatic. Nose: No congestion/rhinnorhea. Mouth/Throat: Mucous membranes are moist.  Neck: No stridor.   Cardiovascular: Normal rate, regular rhythm. Grossly normal heart sounds.   Respiratory: Normal respiratory effort.  No retractions. Lungs CTAB. Gastrointestinal: Soft and nontender. No distention. No CVA tenderness. Musculoskeletal: No lower extremity tenderness nor edema.  No joint effusions. Neurologic:  Normal speech and language. No gross focal neurologic deficits are appreciated. Skin:  Skin is warm, dry and intact. No rash noted. Psychiatric: Mood and affect are normal. Speech and behavior are normal.  ____________________________________________   LABS (all labs ordered are listed, but only abnormal results are displayed)  Labs Reviewed  CBC WITH DIFFERENTIAL/PLATELET - Abnormal; Notable for the following:       Result Value   WBC 16.2 (*)    RBC 4.02 (*)    Hemoglobin  12.8 (*)    HCT 38.0 (*)    Neutro Abs 13.9 (*)    All other components within normal limits  COMPREHENSIVE METABOLIC PANEL - Abnormal; Notable for the following:    CO2 21 (*)    Glucose, Bld 146 (*)    BUN 36 (*)    Creatinine, Ser 1.96 (*)    Calcium 8.5 (*)    GFR calc non Af Amer 36 (*)    GFR calc Af Amer 42 (*)    All other components within normal limits  URINALYSIS, COMPLETE (UACMP) WITH MICROSCOPIC - Abnormal; Notable for the following:    Color, Urine AMBER (*)    APPearance CLEAR (*)    Ketones, ur 5 (*)    Protein, ur 30 (*)    All other components within normal limits  TROPONIN I  LIPASE, BLOOD  TYPE AND SCREEN   ____________________________________________  EKG  ED ECG REPORT I, Scha[place],  Youlanda Roys, the attending physician, personally viewed and interpreted this ECG.   Date: 11/28/2016  EKG Time: 1308  Rate: 83  Rhythm: normal sinus rhythm  Axis: Left  Intervals:right bundle branch block  ST&T Change: No ST elevation or depression. T-wave inversions in V2. EKG unchanged since 2011. ____________________________________________  RADIOLOGY  Does not appear obstructed. ____________________________________________   PROCEDURES  Procedure(s) performed:   Procedures  Critical Care performed:   ____________________________________________   INITIAL IMPRESSION / ASSESSMENT AND PLAN / ED COURSE  Pertinent labs & imaging results that were available during my care of the patient were reviewed by me and considered in my medical decision making (see chart for details).  ----------------------------------------- 2:47 PM on 11/28/2016 -----------------------------------------  Patient says that he has had several episodes of black watery stool while in the emergency department.    ----------------------------------------- 3:13 PM on 11/28/2016 -----------------------------------------  Patient now with 4 episodes with a small amount of loose,  black stool. Hemoccult with melanotic appearing stool which was strongly heme positive. Patient will be admitted to the hospital. Patient aware of diagnosis of GI bleed. We'll order Protonix. Signed out to Dr. Leslye Peer.    ____________________________________________   FINAL CLINICAL IMPRESSION(S) / ED DIAGNOSES  Final diagnoses:  Abdominal pain  Melena. Renal insufficiency.    NEW MEDICATIONS STARTED DURING THIS VISIT:  New Prescriptions   No medications on file     Note:  This document was prepared using Dragon voice recognition software and may include unintentional dictation errors.     Orbie Pyo, MD 11/28/16 [phone]

## 2016-11-28 NOTE — ED Notes (Signed)
ED Provider at bedside. 

## 2016-11-28 NOTE — Progress Notes (Signed)
While rounding, [place] made initial visit to room ED-5H. Pt was sitting on the edge of the bed. Friend was bedside. Pt stated he was having some abdominal pain and was waiting on some test to help diagnose the problem. Pt is expecting x-ray among other tests. [place] will attempt to follow up later this afternoon.    11/28/16 1400  Clinical Encounter Type  Visited With Patient;Patient and family together  Visit Type Initial;Spiritual support;ED  Consult/Referral To Chaplain

## 2016-11-28 NOTE — H&P (Addendum)
Alan Trevino at [place] NAME: Alan Trevino    MR#:  [id number]  DATE OF BIRTH:  [birth year]  DATE OF ADMISSION:  11/28/2016  PRIMARY CARE PHYSICIAN: Juline Patch, MD   REQUESTING/REFERRING PHYSICIAN: Dr Tresa Endo  CHIEF COMPLAINT:   Chief Complaint  Patient presents with  . Abdominal Pain    HISTORY OF PRESENT ILLNESS:  Alan Trevino  is a 56 y.o. male with a known history of Ulcer disease presents with epigastric abdominal pain. He states that he had a passing out episode and was unable to give me much details about that. He came to the ER and then had 4 black bowel movements that were watery. He's had some nausea and some epigastric pain. He did eat an apple pie around 9:30 AM. He stated he did have an episode of ulcer disease about 3 or 4 years ago. He feels very weak.  PAST MEDICAL HISTORY:   Past Medical History:  Diagnosis Date  . Depression   . H/O ulcer disease   . Hypertension   . SBO (small bowel obstruction) ([place])     PAST SURGICAL HISTORY:   Past Surgical History:  Procedure Laterality Date  . COLONOSCOPY WITH PROPOFOL N/A 12/29/2015   Procedure: COLONOSCOPY WITH PROPOFOL;  Surgeon: [name], MD;  Location: [place];  Service: Endoscopy;  Laterality: N/A;  . ESOPHAGEAL DILATION     stretching  . HERNIA REPAIR    . POLYPECTOMY N/A 12/29/2015   Procedure: POLYPECTOMY;  Surgeon: [name], MD;  Location: [place];  Service: Endoscopy;  Laterality: N/A;  . SHOULDER SURGERY  09/2009    SOCIAL HISTORY:   Social History  Substance Use Topics  . Smoking status: Former Research scientist (life sciences)  . Smokeless tobacco: Current User    Types: Snuff  . Alcohol use 7.2 oz/week    12 Cans of beer per week    FAMILY HISTORY:   Family History  Problem Relation Age of Onset  . Ovarian cancer Mother   . CAD Father     DRUG ALLERGIES:  No Known Allergies  REVIEW OF SYSTEMS:  CONSTITUTIONAL: No  fever. Positive for fatigue. Positive for sweating. Positive weight gain 10-15 pounds EYES: No blurred or double vision.  EARS, NOSE, AND THROAT: No tinnitus or ear pain. No sore throat. Positive for runny nose. Positive for dry throat RESPIRATORY: No cough, shortness of breath, wheezing or hemoptysis.  CARDIOVASCULAR: No chest pain, orthopnea, edema.  GASTROINTESTINAL: Positive for nausea. No vomiting, diarrhea or abdominal pain. No blood in bowel movements GENITOURINARY: No dysuria, hematuria.  ENDOCRINE: No polyuria, nocturia,  HEMATOLOGY: No anemia, easy bruising or bleeding SKIN: No rash or lesion. MUSCULOSKELETAL: No joint pain or arthritis.   NEUROLOGIC: Passed out this afternoon PSYCHIATRY: History of anxiety and depression  MEDICATIONS AT HOME:   Prior to Admission medications   Medication Sig Start Date End Date Taking? Authorizing Provider  acetaminophen (TYLENOL) 500 MG tablet Take 500 mg by mouth every 6 (six) hours as needed.   Yes [provider]  amLODipine-benazepril (LOTREL) 10-20 MG capsule Take 1 capsule by mouth daily. 06/13/16  Yes Juline Patch, MD  baclofen (LIORESAL) 10 MG tablet TAKE 1 TABLET BY MOUTH EVERY DAY 11/08/16  Yes Juline Patch, MD  buPROPion (WELLBUTRIN XL) 300 MG 24 hr tablet Take 1 tablet by mouth daily. Dr Kasandra Knudsen 10/11/13  Yes [provider]  busPIRone (BUSPAR) 15 MG tablet Take 1 tablet  by mouth 2 (two) times daily. Dr Kasandra Knudsen   Yes [provider]  methylphenidate (RITALIN) 20 MG tablet Take 1 tablet by mouth 2 (two) times daily. Dr Kasandra Knudsen   Yes [provider]      VITAL SIGNS:  Blood pressure (!) 118/95, pulse 90, temperature 97.9 F (36.6 C), temperature source Oral, resp. rate 17, height 5\' 3"  (1.6 m), weight 77.1 kg (170 lb), SpO2 99 %.  PHYSICAL EXAMINATION:  GENERAL:  56 y.o.-year-old patient lying in the bed with no acute distress.  EYES: Pupils equal, round, reactive to light and accommodation. No scleral  icterus. Extraocular muscles intact.  HEENT: Head atraumatic, normocephalic. Oropharynx and nasopharynx clear.  NECK:  Supple, no jugular venous distention. No thyroid enlargement, no tenderness.  LUNGS: Normal breath sounds bilaterally, no wheezing, rales,rhonchi or crepitation. No use of accessory muscles of respiration.  CARDIOVASCULAR: S1, S2 normal. No murmurs, rubs, or gallops.  ABDOMEN: Soft, nontender, nondistended. Bowel sounds present. No organomegaly or mass.  EXTREMITIES: No pedal edema, cyanosis, or clubbing.  NEUROLOGIC: Cranial nerves II through XII are intact. Muscle strength 5/5 in all extremities. Sensation intact. Gait not checked.  PSYCHIATRIC: The patient is alert and oriented x 3.  SKIN: No rash, lesion, or ulcer.   LABORATORY PANEL:   CBC  Recent Labs Lab 11/28/16 1320  WBC 16.2*  HGB 12.8*  HCT 38.0*  PLT 234   ------------------------------------------------------------------------------------------------------------------  Chemistries   Recent Labs Lab 11/28/16 1320  NA 137  K 4.0  CL 107  CO2 21*  GLUCOSE 146*  BUN 36*  CREATININE 1.96*  CALCIUM 8.5*  AST 27  ALT 22  ALKPHOS 44  BILITOT 1.1   ------------------------------------------------------------------------------------------------------------------  Cardiac Enzymes  Recent Labs Lab 11/28/16 1320  TROPONINI <0.03   ------------------------------------------------------------------------------------------------------------------  RADIOLOGY:  Dg Abd Acute W/chest  Result Date: 11/28/2016 CLINICAL DATA:  Lower abdominal pain and nausea 2 days. Diarrhea today. EXAM: DG ABDOMEN ACUTE W/ 1V CHEST COMPARISON:  Chest x-ray 02/19/2005 and abdominal films 04/16/2013 FINDINGS: Lungs are clear. Cardiomediastinal silhouette is within normal. Moderate-size hiatal hernia is present. Abdominal images demonstrate a nonobstructive bowel gas pattern. There is no free peritoneal air or air-fluid  levels. No mass or mass effect. Mild degenerative change of the spine and hips. IMPRESSION: Nonobstructive bowel gas pattern.  No acute cardiopulmonary disease. Hiatal hernia. Electronically Signed   By: Marin Olp M.D.   On: 11/28/2016 15:17    EKG:   Normal sinus rhythm 83 bpm, left axis deviation, right bundle branch block  IMPRESSION AND PLAN:   1. Upper GI bleed with 4 episodes of black diarrhea. Syncope likely from GI bleed. ER physician started on Protonix drip. Case discussed with gastroenterology Dr. Allen Norris to see the patient for consideration of endoscopy. Serial hemoglobins. Nothing by mouth for now. 2. Acute kidney injury. Hold benazepril and antihypertensive medications at this point. Gentle IV fluid hydration 3. Essential hypertension hold Norvasc and benazepril. 4. Anxiety depression continue psychiatric medications. 5. Leukocytosis. No signs of infection. Could be secondary to syncopal episode. Recheck tomorrow morning.  All the records are reviewed and case discussed with ED provider. Management plans discussed with the patient, and he is in agreement.  CODE STATUS: Full code  TOTAL TIME TAKING CARE OF THIS PATIENT: 50 minutes.    Loletha Grayer M.D on 11/28/2016 at 4:27 PM  Between 7am to 6pm - Pager - [phone]  After 6pm call admission pager [phone]  [employer] Office  [phone]  CC:  Primary care physician; Juline Patch, MD

## 2016-11-28 NOTE — ED Triage Notes (Signed)
Pt comes into the ED via EMS c/o abd pain that started this morning.  Patient has h/o SBO.  C/o distention, lack of bowel movement since Monday, and states he is having nausea associated with the abdominal pain.  Patient alert and oriented x4.  Patient in NAD at this time with even and unlabored respirations.

## 2016-11-29 ENCOUNTER — Inpatient Hospital Stay: Payer: BLUE CROSS/BLUE SHIELD | Admitting: Anesthesiology

## 2016-11-29 ENCOUNTER — Encounter
Admission: EM | Disposition: A | Discharge status: Home / Self Care | Payer: Self-pay | Source: Home / Self Care | Attending: Internal Medicine

## 2016-11-29 DIAGNOSIS — K253 Acute gastric ulcer without hemorrhage or perforation: Secondary | ICD-10-CM

## 2016-11-29 DIAGNOSIS — K920 Hematemesis: Secondary | ICD-10-CM

## 2016-11-29 HISTORY — PX: ESOPHAGOGASTRODUODENOSCOPY (EGD) WITH PROPOFOL: SHX5813

## 2016-11-29 LAB — BASIC METABOLIC PANEL
Anion gap: 6 (ref 5–15)
BUN: 36 mg/dL — ABNORMAL HIGH (ref 6–20)
CO2: 21 mmol/L — ABNORMAL LOW (ref 22–32)
Calcium: 8.2 mg/dL — ABNORMAL LOW (ref 8.9–10.3)
Chloride: 113 mmol/L — ABNORMAL HIGH (ref 101–111)
Creatinine, Ser: 1.22 mg/dL (ref 0.61–1.24)
GFR calc Af Amer: >60 mL/min (ref >60)
GFR calc non Af Amer: >60 mL/min (ref >60)
Glucose, Bld: 98 mg/dL (ref 65–99)
Potassium: 4.1 mmol/L (ref 3.5–5.1)
Sodium: 140 mmol/L (ref 135–145)

## 2016-11-29 LAB — CBC
HCT: 33.2 % — ABNORMAL LOW (ref 40.0–52.0)
Hemoglobin: 11.3 g/dL — ABNORMAL LOW (ref 13.0–18.0)
MCH: 31.9 pg (ref 26.0–34.0)
MCHC: 34 g/dL (ref 32.0–36.0)
MCV: 93.8 fL (ref 80.0–100.0)
Platelets: 196 10*3/uL (ref 150–440)
RBC: 3.54 MIL/uL — ABNORMAL LOW (ref 4.40–5.90)
RDW: 13 % (ref 11.5–14.5)
WBC: 12.4 10*3/uL — ABNORMAL HIGH (ref 3.8–10.6)

## 2016-11-29 LAB — HEMOGLOBIN
Hemoglobin: 11.8 g/dL — ABNORMAL LOW (ref 13.0–18.0)
Hemoglobin: 11 g/dL — ABNORMAL LOW (ref 13.0–18.0)

## 2016-11-29 LAB — HIV ANTIBODY (ROUTINE TESTING W REFLEX): HIV Screen 4th Generation wRfx: NONREACTIVE

## 2016-11-29 SURGERY — ESOPHAGOGASTRODUODENOSCOPY (EGD) WITH PROPOFOL
Anesthesia: General

## 2016-11-29 MED ORDER — PROPOFOL 10 MG/ML IV BOLUS
INTRAVENOUS | Status: DC | PRN
  Administered 2016-11-29: 80 mg via INTRAVENOUS

## 2016-11-29 MED ORDER — LIDOCAINE HCL (CARDIAC) 20 MG/ML IV SOLN
INTRAVENOUS | Status: DC | PRN
  Administered 2016-11-29: 50 mg via INTRAVENOUS

## 2016-11-29 NOTE — Op Note (Signed)
[facility] Patient Name: Alan Trevino Procedure Date: 11/29/2016 12:02 PM MRN: [id number] Account #: [id number] Date of Birth: [birth year] Admit Type: Inpatient Age: 56 Room: [facility] ENDO ROOM 4 Gender: Male Note Status: Finalized Procedure:            Upper GI endoscopy Indications:          Hematemesis Providers:            [name] MD, MD Medicines:            Propofol per Anesthesia Complications:        No immediate complications. Procedure:            Pre-Anesthesia Assessment:                       - Prior to the procedure, a History and Physical was                        performed, and patient medications and allergies were                        reviewed. The patient's tolerance of previous                        anesthesia was also reviewed. The risks and benefits of                        the procedure and the sedation options and risks were                        discussed with the patient. All questions were                        answered, and informed consent was obtained. Prior                        Anticoagulants: The patient has taken no previous                        anticoagulant or antiplatelet agents. ASA Grade                        Assessment: II - A patient with mild systemic disease.                        After reviewing the risks and benefits, the patient was                        deemed in satisfactory condition to undergo the                        procedure.                       After obtaining informed consent, the endoscope was                        passed under direct vision. Throughout the procedure,                        the patient's blood pressure, pulse, and  oxygen                        saturations were monitored continuously. The Endoscope                        was introduced through the mouth, and advanced to the                        second part of duodenum. The upper GI endoscopy was                accomplished without difficulty. The patient tolerated                        the procedure well. Findings:      One moderate benign-appearing, intrinsic stenosis was found at the       gastroesophageal junction. This measured 1 cm (inner diameter) and was       traversed.      A medium-sized hiatal hernia was present.      One non-bleeding cratered gastric ulcer with no stigmata of bleeding was       found in the cardia. The lesion was 10 mm in largest dimension. Impression:           - Benign-appearing esophageal stenosis.                       - Medium-sized hiatal hernia.                       - Non-bleeding gastric ulcer with no stigmata of                        bleeding.                       - No specimens collected. Recommendation:       - Return patient to hospital ward for ongoing care.                       - Full liquid diet.                       - Use a proton pump inhibitor PO BID.                       - Repeat upper endoscopy in 6 weeks to check healing. Procedure Code(s):    --- Professional ---                       [phone], Esophagogastroduodenoscopy, flexible, transoral;                        diagnostic, including collection of specimen(s) by                        brushing or washing, when performed (separate procedure) Diagnosis Code(s):    --- Professional ---                       K92.0, Hematemesis                       K25.9, Gastric ulcer, unspecified  as acute or chronic,                        without hemorrhage or perforation                       K22.2, Esophageal obstruction CPT copyright 2016 American Medical Association. All rights reserved. The codes documented in this report are preliminary and upon coder review may  be revised to meet current compliance requirements. [name] MD, MD 11/29/2016 12:16:27 PM This report has been signed electronically. Number of Addenda: 0 Note Initiated On: 11/29/2016 12:02 PM      [facility]

## 2016-11-29 NOTE — Transfer of Care (Signed)
Immediate Anesthesia Transfer of Care Note  Patient: Alan Trevino  Procedure(s) Performed: Procedure(s): ESOPHAGOGASTRODUODENOSCOPY (EGD) WITH PROPOFOL (N/A)  Patient Location: PACU and Endoscopy Unit  Anesthesia Type:General  Level of Consciousness: drowsy and patient cooperative  Airway & Oxygen Therapy: Patient Spontanous Breathing and Patient connected to nasal cannula oxygen  Post-op Assessment: Report given to RN and Post -op Vital signs reviewed and stable  Post vital signs: Reviewed and stable  Last Vitals:  Vitals:   11/29/16 1155 11/29/16 1219  BP: 131/77 131/72  Pulse: 79 85  Resp: 20 20  Temp: 36.6 C 36.5 C  SpO2: 97% 99%    Last Pain:  Vitals:   11/29/16 1219  TempSrc: Tympanic  PainSc:          Complications: No apparent anesthesia complications

## 2016-11-29 NOTE — Anesthesia Post-op Follow-up Note (Signed)
Anesthesia QCDR form completed.        

## 2016-11-29 NOTE — Progress Notes (Signed)
[facility] [place] NAME: Alan Trevino    MR#:  [id number]  DATE OF BIRTH:  1960-05-09  SUBJECTIVE:  CHIEF COMPLAINT:   Chief Complaint  Patient presents with  . Abdominal Pain    Came with epigastric abdominal pain and passing out episode with 4 black bowel movements.   Hemoglobin is stable, endoscopically is done with nonbleeding gastric ulcer and esophageal stenosis.  REVIEW OF SYSTEMS:  CONSTITUTIONAL: No fever, fatigue or weakness.  EYES: No blurred or double vision.  EARS, NOSE, AND THROAT: No tinnitus or ear pain.  RESPIRATORY: No cough, shortness of breath, wheezing or hemoptysis.  CARDIOVASCULAR: No chest pain, orthopnea, edema.  GASTROINTESTINAL: No nausea, vomiting, diarrhea or abdominal pain.  GENITOURINARY: No dysuria, hematuria.  ENDOCRINE: No polyuria, nocturia,  HEMATOLOGY: No anemia, easy bruising or bleeding SKIN: No rash or lesion. MUSCULOSKELETAL: No joint pain or arthritis.   NEUROLOGIC: No tingling, numbness, weakness.  PSYCHIATRY: No anxiety or depression.   ROS  DRUG ALLERGIES:  No Known Allergies  VITALS:  Blood pressure 132/84, pulse 80, temperature 97.7 F (36.5 C), temperature source Tympanic, resp. rate 18, height 5\' 3"  (1.6 m), weight 77.1 kg (170 lb), SpO2 98 %.  PHYSICAL EXAMINATION:  GENERAL:  56 y.o.-year-old patient lying in the bed with no acute distress.  EYES: Pupils equal, round, reactive to light and accommodation. No scleral icterus. Extraocular muscles intact.  HEENT: Head atraumatic, normocephalic. Oropharynx and nasopharynx clear.  NECK:  Supple, no jugular venous distention. No thyroid enlargement, no tenderness.  LUNGS: Normal breath sounds bilaterally, no wheezing, rales,rhonchi or crepitation. No use of accessory muscles of respiration.  CARDIOVASCULAR: S1, S2 normal. No murmurs, rubs, or gallops.  ABDOMEN: Soft, nontender, nondistended. Bowel sounds present. No organomegaly or  mass.  EXTREMITIES: No pedal edema, cyanosis, or clubbing.  NEUROLOGIC: Cranial nerves II through XII are intact. Muscle strength 5/5 in all extremities. Sensation intact. Gait not checked.  PSYCHIATRIC: The patient is alert and oriented x 3.  SKIN: No obvious rash, lesion, or ulcer.   Physical Exam LABORATORY PANEL:   CBC  Recent Labs Lab 11/29/16 0156 11/29/16 1028  WBC 12.4*  --   HGB 11.3* 11.8*  HCT 33.2*  --   PLT 196  --    ------------------------------------------------------------------------------------------------------------------  Chemistries   Recent Labs Lab 11/28/16 1320 11/29/16 0156  NA 137 140  K 4.0 4.1  CL 107 113*  CO2 21* 21*  GLUCOSE 146* 98  BUN 36* 36*  CREATININE 1.96* 1.22  CALCIUM 8.5* 8.2*  AST 27  --   ALT 22  --   ALKPHOS 44  --   BILITOT 1.1  --    ------------------------------------------------------------------------------------------------------------------  Cardiac Enzymes  Recent Labs Lab 11/28/16 1320  TROPONINI <0.03   ------------------------------------------------------------------------------------------------------------------  RADIOLOGY:  Dg Abd Acute W/chest  Result Date: 11/28/2016 CLINICAL DATA:  Lower abdominal pain and nausea 2 days. Diarrhea today. EXAM: DG ABDOMEN ACUTE W/ 1V CHEST COMPARISON:  Chest x-ray 02/19/2005 and abdominal films 04/16/2013 FINDINGS: Lungs are clear. Cardiomediastinal silhouette is within normal. Moderate-size hiatal hernia is present. Abdominal images demonstrate a nonobstructive bowel gas pattern. There is no free peritoneal air or air-fluid levels. No mass or mass effect. Mild degenerative change of the spine and hips. IMPRESSION: Nonobstructive bowel gas pattern.  No acute cardiopulmonary disease. Hiatal hernia. Electronically Signed   By: Marin Olp M.D.   On: 11/28/2016 15:17    ASSESSMENT AND PLAN:   Active  Problems:   Upper GI bleed   Hematemesis without nausea    Acute esophagogastric ulcer   1. Upper GI bleed with 4 episodes of black diarrhea. Syncope likely from GI bleed.   on Protonix drip.    Appreciated GI hlep.    EGD- esophageal stenosis and nonbleeding gastric ulcer.   Slow upgrade diet. 2. Acute kidney injury. Hold benazepril and antihypertensive medications at this point. Gentle IV fluid hydration   Improving. 3. Essential hypertension hold Norvasc and benazepril. 4. Anxiety depression continue psychiatric medications. 5. Leukocytosis. No signs of infection. Could be secondary to syncopal episode. Recheck - coming down.   All the records are reviewed and case discussed with Care Management/Social Workerr. Management plans discussed with the patient, family and they are in agreement.  CODE STATUS: full.  TOTAL TIME TAKING CARE OF THIS PATIENT: 35 minutes.    POSSIBLE D/C IN 1-2 DAYS, DEPENDING ON CLINICAL CONDITION.   Vaughan Basta M.D on 11/29/2016   Between 7am to 6pm - Pager - [phone]  After 6pm go to [email or web address] - password [email or web address] [facility]  Office  [phone]  CC: Primary care physician; Juline Patch, MD  Note: This dictation was prepared with Dragon dictation along with smaller phrase technology. Any transcriptional errors that result from this process are unintentional.

## 2016-11-29 NOTE — Anesthesia Postprocedure Evaluation (Signed)
Anesthesia Post Note  Patient: Gaberiel Youngblood  Procedure(s) Performed: Procedure(s) (LRB): ESOPHAGOGASTRODUODENOSCOPY (EGD) WITH PROPOFOL (N/A)  Patient location during evaluation: Endoscopy Anesthesia Type: General Level of consciousness: awake and alert Pain management: pain level controlled Vital Signs Assessment: post-procedure vital signs reviewed and stable Respiratory status: spontaneous breathing, nonlabored ventilation, respiratory function stable and patient connected to nasal cannula oxygen Cardiovascular status: blood pressure returned to baseline and stable Postop Assessment: no signs of nausea or vomiting Anesthetic complications: no     Last Vitals:  Vitals:   11/29/16 1239 11/29/16 1249  BP: (!) 144/87 132/84  Pulse: 81 80  Resp: 20 18  Temp:    SpO2: 97% 98%    Last Pain:  Vitals:   11/29/16 1219  TempSrc: Tympanic  PainSc:                  Precious Haws Piscitello

## 2016-11-29 NOTE — Anesthesia Preprocedure Evaluation (Signed)
Anesthesia Evaluation  Patient identified by MRN, date of birth, ID band Patient awake    Reviewed: Allergy & Precautions, H&P , NPO status , Patient's Chart, lab work & pertinent test results  History of Anesthesia Complications Negative for: history of anesthetic complications  Airway Mallampati: III  TM Distance: <3 FB Neck ROM: limited    Dental  (+) Poor Dentition, Chipped, Missing, Edentulous Upper   Pulmonary neg shortness of breath, former smoker,           Cardiovascular Exercise Tolerance: Good hypertension, (-) angina(-) Past MI      Neuro/Psych PSYCHIATRIC DISORDERS Depression negative neurological ROS     GI/Hepatic negative GI ROS, Neg liver ROS, neg GERD  ,  Endo/Other  negative endocrine ROS  Renal/GU negative Renal ROS  negative genitourinary   Musculoskeletal  (+) Arthritis ,   Abdominal   Peds  Hematology negative hematology ROS (+) anemia ,   Anesthesia Other Findings Patient is NPO appropriate and reports no nausea or vomiting today.  Past Medical History: No date: Depression No date: H/O ulcer disease No date: Hypertension No date: SBO (small bowel obstruction) (HCC)  Past Surgical History: 12/29/2015: COLONOSCOPY WITH PROPOFOL; N/A     Comment:  Procedure: COLONOSCOPY WITH PROPOFOL;  Surgeon: [name], MD;  Location: [facility];  Service:               Endoscopy;  Laterality: N/A; No date: ESOPHAGEAL DILATION     Comment:  stretching No date: HERNIA REPAIR 12/29/2015: POLYPECTOMY; N/A     Comment:  Procedure: POLYPECTOMY;  Surgeon: [name], MD;                Location: [facility];  Service: Endoscopy;                Laterality: N/A; 09/2009: SHOULDER SURGERY  BMI    Body Mass Index:  30.11 kg/m      Reproductive/Obstetrics negative OB ROS                             Anesthesia Physical Anesthesia Plan  ASA:  III  Anesthesia Plan: General   Post-op Pain Management:    Induction: Intravenous  PONV Risk Score and Plan:   Airway Management Planned: Natural Airway and Nasal Cannula  Additional Equipment:   Intra-op Plan:   Post-operative Plan:   Informed Consent: I have reviewed the patients History and Physical, chart, labs and discussed the procedure including the risks, benefits and alternatives for the proposed anesthesia with the patient or authorized representative who has indicated his/her understanding and acceptance.   Dental Advisory Given  Plan Discussed with: Anesthesiologist, CRNA and Surgeon  Anesthesia Plan Comments: (Patient consented for risks of anesthesia including but not limited to:  - adverse reactions to medications - risk of intubation if required - damage to teeth, lips or other oral mucosa - sore throat or hoarseness - Damage to heart, brain, lungs or loss of life  Patient voiced understanding.)        Anesthesia Quick Evaluation

## 2016-11-30 DIAGNOSIS — K922 Gastrointestinal hemorrhage, unspecified: Secondary | ICD-10-CM | POA: Diagnosis present

## 2016-11-30 LAB — HEMOGLOBIN: Hemoglobin: 10.6 g/dL — ABNORMAL LOW (ref 13.0–18.0)

## 2016-11-30 MED ORDER — PANTOPRAZOLE SODIUM 40 MG PO TBEC
40.0000 mg | DELAYED_RELEASE_TABLET | Freq: Two times a day (BID) | ORAL | Status: DC
  Administered 2016-11-30: 40 mg via ORAL
  Filled 2016-11-30: qty 1

## 2016-11-30 MED ORDER — PANTOPRAZOLE SODIUM 40 MG PO TBEC: 40.0000 mg | DELAYED_RELEASE_TABLET | Freq: Two times a day (BID) | ORAL | 1 refills | Status: DC

## 2016-11-30 NOTE — Progress Notes (Signed)
Pt has been discharged home. Discharge papers given and explained to pt. Pt verbalized understanding. Meds and f/u appointments reviewed with pt. Rx given. Pt escorted in a wheelchair.

## 2016-11-30 NOTE — Discharge Summary (Signed)
[facility] [place] NAME: Alan Trevino    MR#:  [id number]  DATE OF BIRTH:  Oct 19, 1960  DATE OF ADMISSION:  11/28/2016 ADMITTING PHYSICIAN: Loletha Grayer, MD  DATE OF DISCHARGE: 11/30/2016  PRIMARY CARE PHYSICIAN: Juline Patch, MD    ADMISSION DIAGNOSIS:  Melena [K92.1] Renal insufficiency [N28.9] Abdominal pain [R10.9]  DISCHARGE DIAGNOSIS:  Active Problems:   Upper GI bleed   Hematemesis without nausea   Acute esophagogastric ulcer   Gastric ulcer  SECONDARY DIAGNOSIS:   Past Medical History:  Diagnosis Date  . Depression   . H/O ulcer disease   . Hypertension   . SBO (small bowel obstruction) ([facility])     HOSPITAL COURSE:   1. Upper GI bleed with 4 episodes of black diarrhea. Syncope likely from GI bleed.   on Protonix drip.    Appreciated GI hlep.    EGD- esophageal stenosis and nonbleeding gastric ulcer.   Slow upgrade diet.  Oral PPI BID, advise to have repeat EGD in 6 week, given referral to GI clinic. 2. Acute kidney injury. Hold benazepril and antihypertensive medications at thispoint.Gentle IV fluid hydration   Improving. 3. Essential hypertension held Norvasc and benazepril.   BP was stable without meds, so suggest to hold on d/c. Follow with PMD. 4. Anxiety depression continue psychiatric medications. 5. Leukocytosis. No signs of infection. Could be secondary to syncopal episode. Recheck - coming down.   DISCHARGE CONDITIONS:   Stable.  CONSULTS OBTAINED:  Treatment Team:  [name], MD  DRUG ALLERGIES:  No Known Allergies  DISCHARGE MEDICATIONS:   Current Discharge Medication List    START taking these medications   Details  pantoprazole (PROTONIX) 40 MG tablet Take 1 tablet (40 mg total) by mouth 2 (two) times daily before a meal. Qty: 60 tablet, Refills: 1      CONTINUE these medications which have NOT CHANGED   Details  acetaminophen (TYLENOL) 500 MG tablet Take 500 mg  by mouth every 6 (six) hours as needed.    baclofen (LIORESAL) 10 MG tablet TAKE 1 TABLET BY MOUTH EVERY DAY Qty: 30 tablet, Refills: 0    buPROPion (WELLBUTRIN XL) 300 MG 24 hr tablet Take 1 tablet by mouth daily. Dr Kasandra Knudsen    busPIRone (BUSPAR) 15 MG tablet Take 1 tablet by mouth 2 (two) times daily. Dr Kasandra Knudsen    methylphenidate (RITALIN) 20 MG tablet Take 1 tablet by mouth 2 (two) times daily. Dr Kasandra Knudsen      STOP taking these medications     amLODipine-benazepril (LOTREL) 10-20 MG capsule          DISCHARGE INSTRUCTIONS:    Follow with PMD and GI clinic as advised.  If you experience worsening of your admission symptoms, develop shortness of breath, life threatening emergency, suicidal or homicidal thoughts you must seek medical attention immediately by calling 911 or calling your MD immediately  if symptoms less severe.  You Must read complete instructions/literature along with all the possible adverse reactions/side effects for all the Medicines you take and that have been prescribed to you. Take any new Medicines after you have completely understood and accept all the possible adverse reactions/side effects.   Please note  You were cared for by a hospitalist during your hospital stay. If you have any questions about your discharge medications or the care you received while you were in the hospital after you are discharged, you can call the unit and asked to  speak with the hospitalist on call if the hospitalist that took care of you is not available. Once you are discharged, your primary care physician will handle any further medical issues. Please note that NO REFILLS for any discharge medications will be authorized once you are discharged, as it is imperative that you return to your primary care physician (or establish a relationship with a primary care physician if you do not have one) for your aftercare needs so that they can reassess your need for medications and monitor your lab  values.    Today   CHIEF COMPLAINT:   Chief Complaint  Patient presents with  . Abdominal Pain    HISTORY OF PRESENT ILLNESS:  Alan Trevino  is a 56 y.o. male with a known history of Ulcer disease presents with epigastric abdominal pain. He states that he had a passing out episode and was unable to give me much details about that. He came to the ER and then had 4 black bowel movements that were watery. He's had some nausea and some epigastric pain. He did eat an apple pie around 9:30 AM. He stated he did have an episode of ulcer disease about 3 or 4 years ago. He feels very weak.   VITAL SIGNS:  Blood pressure 127/82, pulse 80, temperature 98 F (36.7 C), temperature source Oral, resp. rate 18, height 5\' 3"  (1.6 m), weight 77.1 kg (170 lb), SpO2 100 %.  I/O:    Intake/Output Summary (Last 24 hours) at 11/30/16 0816 Last data filed at 11/29/16 1834  Gross per 24 hour  Intake              540 ml  Output                0 ml  Net              540 ml    PHYSICAL EXAMINATION:  GENERAL:  56 y.o.-year-old patient lying in the bed with no acute distress.  EYES: Pupils equal, round, reactive to light and accommodation. No scleral icterus. Extraocular muscles intact.  HEENT: Head atraumatic, normocephalic. Oropharynx and nasopharynx clear.  NECK:  Supple, no jugular venous distention. No thyroid enlargement, no tenderness.  LUNGS: Normal breath sounds bilaterally, no wheezing, rales,rhonchi or crepitation. No use of accessory muscles of respiration.  CARDIOVASCULAR: S1, S2 normal. No murmurs, rubs, or gallops.  ABDOMEN: Soft, non-tender, non-distended. Bowel sounds present. No organomegaly or mass.  EXTREMITIES: No pedal edema, cyanosis, or clubbing.  NEUROLOGIC: Cranial nerves II through XII are intact. Muscle strength 5/5 in all extremities. Sensation intact. Gait not checked.  PSYCHIATRIC: The patient is alert and oriented x 3.  SKIN: No obvious rash, lesion, or ulcer.   DATA  REVIEW:   CBC  Recent Labs Lab 11/29/16 0156  11/30/16 0216  WBC 12.4*  --   --   HGB 11.3*  < > 10.6*  HCT 33.2*  --   --   PLT 196  --   --   < > = values in this interval not displayed.  Chemistries   Recent Labs Lab 11/28/16 1320 11/29/16 0156  NA 137 140  K 4.0 4.1  CL 107 113*  CO2 21* 21*  GLUCOSE 146* 98  BUN 36* 36*  CREATININE 1.96* 1.22  CALCIUM 8.5* 8.2*  AST 27  --   ALT 22  --   ALKPHOS 44  --   BILITOT 1.1  --     Cardiac Enzymes  Recent Labs Lab 11/28/16 1320  TROPONINI <0.03    Microbiology Results  No results found for this or any previous visit.  RADIOLOGY:  Dg Abd Acute W/chest  Result Date: 11/28/2016 CLINICAL DATA:  Lower abdominal pain and nausea 2 days. Diarrhea today. EXAM: DG ABDOMEN ACUTE W/ 1V CHEST COMPARISON:  Chest x-ray 02/19/2005 and abdominal films 04/16/2013 FINDINGS: Lungs are clear. Cardiomediastinal silhouette is within normal. Moderate-size hiatal hernia is present. Abdominal images demonstrate a nonobstructive bowel gas pattern. There is no free peritoneal air or air-fluid levels. No mass or mass effect. Mild degenerative change of the spine and hips. IMPRESSION: Nonobstructive bowel gas pattern.  No acute cardiopulmonary disease. Hiatal hernia. Electronically Signed   By: Marin Olp M.D.   On: 11/28/2016 15:17    EKG:   Orders placed or performed during the hospital encounter of 11/28/16  . ED EKG  . ED EKG  . EKG 12-Lead  . EKG 12-Lead      Management plans discussed with the patient, family and they are in agreement.  CODE STATUS:     Code Status Orders        Start     Ordered   11/28/16 1541  Full code  Continuous     11/28/16 1541    Code Status History    Date Active Date Inactive Code Status Order ID Comments User Context   This patient has a current code status but no historical code status.      TOTAL TIME TAKING CARE OF THIS PATIENT: 35 minutes.    Vaughan Basta M.D on  11/30/2016 at 8:16 AM  Between 7am to 6pm - Pager - [phone]  After 6pm go to [email or web address] - password [email or web address] [facility]  Office  [phone]  CC: Primary care physician; Juline Patch, MD   Note: This dictation was prepared with Dragon dictation along with smaller phrase technology. Any transcriptional errors that result from this process are unintentional.

## 2016-12-02 ENCOUNTER — Encounter: Payer: Self-pay | Admitting: Gastroenterology

## 2016-12-06 ENCOUNTER — Inpatient Hospital Stay: Payer: Self-pay | Admitting: Family Medicine

## 2016-12-09 ENCOUNTER — Encounter: Payer: Self-pay | Admitting: Family Medicine

## 2016-12-09 ENCOUNTER — Telehealth: Payer: Self-pay | Admitting: Gastroenterology

## 2016-12-09 ENCOUNTER — Ambulatory Visit (INDEPENDENT_AMBULATORY_CARE_PROVIDER_SITE_OTHER): Payer: BLUE CROSS/BLUE SHIELD | Admitting: Family Medicine

## 2016-12-09 VITALS — BP 130/78 | HR 52 | Ht 63.0 in | Wt 167.0 lb

## 2016-12-09 DIAGNOSIS — K922 Gastrointestinal hemorrhage, unspecified: Secondary | ICD-10-CM

## 2016-12-09 DIAGNOSIS — F339 Major depressive disorder, recurrent, unspecified: Secondary | ICD-10-CM

## 2016-12-09 DIAGNOSIS — Z09 Encounter for follow-up examination after completed treatment for conditions other than malignant neoplasm: Secondary | ICD-10-CM

## 2016-12-09 NOTE — Progress Notes (Signed)
Name: Alan Trevino   MRN: [id number]    DOB: Apr 18, 1961   Date:12/09/2016       Progress Note  Subjective  Chief Complaint  Chief Complaint  Patient presents with  . Follow-up    hospital visit- discharged on 11/30/16 Dx non bleeding ulcer    Patient presents for hospital followup evaluation s/p upper gi bleed.    No problem-specific Assessment & Plan notes found for this encounter.   Past Medical History:  Diagnosis Date  . Depression   . H/O ulcer disease   . Hypertension   . SBO (small bowel obstruction) (HCC)     Past Surgical History:  Procedure Laterality Date  . COLONOSCOPY WITH PROPOFOL N/A 12/29/2015   Procedure: COLONOSCOPY WITH PROPOFOL;  Surgeon: [name], MD;  Location: [facility];  Service: Endoscopy;  Laterality: N/A;  . ESOPHAGEAL DILATION     stretching  . ESOPHAGOGASTRODUODENOSCOPY (EGD) WITH PROPOFOL N/A 11/29/2016   Procedure: ESOPHAGOGASTRODUODENOSCOPY (EGD) WITH PROPOFOL;  Surgeon: [name], MD;  Location[facility];  Service: Endoscopy;  Laterality: N/A;  . HERNIA REPAIR    . POLYPECTOMY N/A 12/29/2015   Procedure: POLYPECTOMY;  Surgeon: [name], MD;  Location: [facility];  Service: Endoscopy;  Laterality: N/A;  . SHOULDER SURGERY  09/2009    Family History  Problem Relation Age of Onset  . Ovarian cancer Mother   . CAD Father     Social History   Social History  . Marital status: Divorced    Spouse name: N/A  . Number of children: N/A  . Years of education: N/A   Occupational History  . Not on file.   Social History Main Topics  . Smoking status: Former Research scientist (life sciences)  . Smokeless tobacco: Current User    Types: Snuff  . Alcohol use 7.2 oz/week    12 Cans of beer per week  . Drug use: No  . Sexual activity: Not Currently   Other Topics Concern  . Not on file   Social History Narrative  . No narrative on file    No Known Allergies  Outpatient Medications Prior to Visit  Medication Sig  Dispense Refill  . acetaminophen (TYLENOL) 500 MG tablet Take 500 mg by mouth every 6 (six) hours as needed.    . baclofen (LIORESAL) 10 MG tablet TAKE 1 TABLET BY MOUTH EVERY DAY 30 tablet 0  . buPROPion (WELLBUTRIN XL) 300 MG 24 hr tablet Take 1 tablet by mouth daily. Dr Kasandra Knudsen    . busPIRone (BUSPAR) 15 MG tablet Take 1 tablet by mouth 2 (two) times daily. Dr Kasandra Knudsen    . methylphenidate (RITALIN) 20 MG tablet Take 1 tablet by mouth 2 (two) times daily. Dr Kasandra Knudsen    . pantoprazole (PROTONIX) 40 MG tablet Take 1 tablet (40 mg total) by mouth 2 (two) times daily before a meal. 60 tablet 1   No facility-administered medications prior to visit.     Review of Systems  Constitutional: Negative for chills, fever, malaise/fatigue and weight loss.  HENT: Negative for ear discharge, ear pain and sore throat.   Eyes: Negative for blurred vision.  Respiratory: Negative for cough, sputum production, shortness of breath and wheezing.   Cardiovascular: Negative for chest pain, palpitations and leg swelling.  Gastrointestinal: Negative for abdominal pain, blood in stool, constipation, diarrhea, heartburn, melena and nausea.  Genitourinary: Negative for dysuria, frequency, hematuria and urgency.  Musculoskeletal: Negative for back pain, joint pain, myalgias and neck pain.  Skin: Negative  for rash.  Neurological: Negative for dizziness, tingling, sensory change, focal weakness and headaches.  Endo/Heme/Allergies: Negative for environmental allergies and polydipsia. Does not bruise/bleed easily.  Psychiatric/Behavioral: Negative for depression and suicidal ideas. The patient is not nervous/anxious and does not have insomnia.      Objective  Vitals:   12/09/16 0929  BP: 130/78  Pulse: (!) 52  Weight: 167 lb (75.8 kg)  Height: 5\' 3"  (1.6 m)    Physical Exam  Constitutional: He is oriented to person, place, and time and well-developed, well-nourished, and in no distress.  HENT:  Head: Normocephalic.  Right  Ear: External ear normal.  Left Ear: External ear normal.  Nose: Nose normal.  Mouth/Throat: Oropharynx is clear and moist.  Eyes: Pupils are equal, round, and reactive to light. Conjunctivae and EOM are normal. Right eye exhibits no discharge. Left eye exhibits no discharge. No scleral icterus.  Neck: Normal range of motion. Neck supple. No JVD present. No tracheal deviation present. No thyromegaly present.  Cardiovascular: Normal rate, regular rhythm, normal heart sounds and intact distal pulses.  Exam reveals no gallop and no friction rub.   No murmur heard. Pulmonary/Chest: Breath sounds normal. No respiratory distress. He has no wheezes. He has no rales.  Abdominal: Soft. Bowel sounds are normal. He exhibits no mass. There is no hepatosplenomegaly. There is no tenderness. There is no rebound, no guarding and no CVA tenderness.  Musculoskeletal: Normal range of motion. He exhibits no edema or tenderness.  Lymphadenopathy:    He has no cervical adenopathy.  Neurological: He is alert and oriented to person, place, and time. He has normal sensation, normal strength, normal reflexes and intact cranial nerves. No cranial nerve deficit.  Skin: Skin is warm. No rash noted.  Psychiatric: Mood and affect normal.  Nursing note and vitals reviewed.     Assessment & Plan  Problem List Items Addressed This Visit      Digestive   Upper GI bleed   Relevant Orders   Hemoglobin     Other   Recurrent major depressive episodes [facility])    Other Visit Diagnoses    Hospital discharge follow-up    -  Primary   Relevant Orders   Hemoglobin      No orders of the defined types were placed in this encounter.     Dr. [facility]  12/09/16

## 2016-12-09 NOTE — Telephone Encounter (Signed)
12/09/16 Dr. Ronnald Ramp' office called stating patient needs a f/u EDG in 6 weeks.

## 2016-12-10 LAB — HEMOGLOBIN: Hemoglobin: 11.6 g/dL — ABNORMAL LOW (ref 13.0–17.7)

## 2016-12-12 ENCOUNTER — Other Ambulatory Visit: Payer: Self-pay

## 2016-12-12 ENCOUNTER — Telehealth: Payer: Self-pay

## 2016-12-12 NOTE — Telephone Encounter (Signed)
Put in another call to [facility]- pt has not heard from Caroleen or Dr Dorothey Baseman office. He has called and wants to know when he is suppose to have this test repeated or is it scheduled.

## 2016-12-13 ENCOUNTER — Other Ambulatory Visit: Payer: Self-pay

## 2016-12-13 DIAGNOSIS — K279 Peptic ulcer, site unspecified, unspecified as acute or chronic, without hemorrhage or perforation: Secondary | ICD-10-CM

## 2016-12-13 NOTE — Telephone Encounter (Signed)
Pt scheduled for a follow up EGD at [facility] on 01/10/17.

## 2016-12-20 ENCOUNTER — Other Ambulatory Visit: Payer: Self-pay | Admitting: Family Medicine

## 2017-01-13 ENCOUNTER — Encounter: Payer: Self-pay | Admitting: *Deleted

## 2017-01-16 ENCOUNTER — Other Ambulatory Visit: Payer: Self-pay | Admitting: Family Medicine

## 2017-01-16 NOTE — Discharge Instructions (Signed)
General Anesthesia, Adult, Care After °These instructions provide you with information about caring for yourself after your procedure. Your health care provider may also give you more specific instructions. Your treatment has been planned according to current medical practices, but problems sometimes occur. Call your health care provider if you have any problems or questions after your procedure. °What can I expect after the procedure? °After the procedure, it is common to have: °· Vomiting. °· A sore throat. °· Mental slowness. ° °It is common to feel: °· Nauseous. °· Cold or shivery. °· Sleepy. °· Tired. °· Sore or achy, even in parts of your body where you did not have surgery. ° °Follow these instructions at home: °For at least 24 hours after the procedure: °· Do not: °? Participate in activities where you could fall or become injured. °? Drive. °? Use heavy machinery. °? Drink alcohol. °? Take sleeping pills or medicines that cause drowsiness. °? Make important decisions or sign legal documents. °? Take care of children on your own. °· Rest. °Eating and drinking °· If you vomit, drink water, juice, or soup when you can drink without vomiting. °· Drink enough fluid to keep your urine clear or pale yellow. °· Make sure you have little or no nausea before eating solid foods. °· Follow the diet recommended by your health care provider. °General instructions °· Have a responsible adult stay with you until you are awake and alert. °· Return to your normal activities as told by your health care provider. Ask your health care provider what activities are safe for you. °· Take over-the-counter and prescription medicines only as told by your health care provider. °· If you smoke, do not smoke without supervision. °· Keep all follow-up visits as told by your health care provider. This is important. °Contact a health care provider if: °· You continue to have nausea or vomiting at home, and medicines are not helpful. °· You  cannot drink fluids or start eating again. °· You cannot urinate after 8-12 hours. °· You develop a skin rash. °· You have fever. °· You have increasing redness at the site of your procedure. °Get help right away if: °· You have difficulty breathing. °· You have chest pain. °· You have unexpected bleeding. °· You feel that you are having a life-threatening or urgent problem. °This information is not intended to replace advice given to you by your health care provider. Make sure you discuss any questions you have with your health care provider. °Document Released: 07/15/2000 Document Revised: 09/11/2015 Document Reviewed: 03/23/2015 °Elsevier Interactive Patient Education © 2018 [employer] Inc[employer] ° °

## 2017-01-17 ENCOUNTER — Ambulatory Visit: Payer: BLUE CROSS/BLUE SHIELD | Admitting: Anesthesiology

## 2017-01-17 ENCOUNTER — Ambulatory Visit
Admission: RE | Admit: 2017-01-17 | Discharge: 2017-01-17 | Disposition: A | Discharge status: Home / Self Care | Payer: BLUE CROSS/BLUE SHIELD | Source: Ambulatory Visit | Attending: Gastroenterology | Admitting: Gastroenterology

## 2017-01-17 ENCOUNTER — Encounter
Admission: RE | Disposition: A | Discharge status: Home / Self Care | Payer: Self-pay | Source: Ambulatory Visit | Attending: Gastroenterology

## 2017-01-17 DIAGNOSIS — I10 Essential (primary) hypertension: Secondary | ICD-10-CM | POA: Insufficient documentation

## 2017-01-17 DIAGNOSIS — K222 Esophageal obstruction: Secondary | ICD-10-CM | POA: Diagnosis not present

## 2017-01-17 DIAGNOSIS — K273 Acute peptic ulcer, site unspecified, without hemorrhage or perforation: Secondary | ICD-10-CM | POA: Diagnosis not present

## 2017-01-17 DIAGNOSIS — Z79899 Other long term (current) drug therapy: Secondary | ICD-10-CM | POA: Insufficient documentation

## 2017-01-17 DIAGNOSIS — K259 Gastric ulcer, unspecified as acute or chronic, without hemorrhage or perforation: Secondary | ICD-10-CM

## 2017-01-17 DIAGNOSIS — K449 Diaphragmatic hernia without obstruction or gangrene: Secondary | ICD-10-CM | POA: Diagnosis not present

## 2017-01-17 DIAGNOSIS — Z87891 Personal history of nicotine dependence: Secondary | ICD-10-CM | POA: Diagnosis not present

## 2017-01-17 DIAGNOSIS — K2950 Unspecified chronic gastritis without bleeding: Secondary | ICD-10-CM | POA: Insufficient documentation

## 2017-01-17 HISTORY — PX: ESOPHAGOGASTRODUODENOSCOPY (EGD) WITH PROPOFOL: SHX5813

## 2017-01-17 HISTORY — PX: POLYPECTOMY: SHX5525

## 2017-01-17 HISTORY — DX: Presence of dental prosthetic device (complete) (partial): Z97.2

## 2017-01-17 HISTORY — PX: ESOPHAGEAL DILATION: SHX303

## 2017-01-17 SURGERY — ESOPHAGOGASTRODUODENOSCOPY (EGD) WITH PROPOFOL
Anesthesia: General | Wound class: Clean Contaminated

## 2017-01-17 MED ORDER — LACTATED RINGERS IV SOLN
INTRAVENOUS | Status: DC
  Administered 2017-01-17: 08:00:00 via INTRAVENOUS

## 2017-01-17 MED ORDER — GLYCOPYRROLATE 0.2 MG/ML IJ SOLN
INTRAMUSCULAR | Status: DC | PRN
  Administered 2017-01-17: 0.1 mg via INTRAVENOUS

## 2017-01-17 MED ORDER — STERILE WATER FOR IRRIGATION IR SOLN
Status: DC | PRN
  Administered 2017-01-17: 10:00:00

## 2017-01-17 MED ORDER — PROPOFOL 10 MG/ML IV BOLUS
INTRAVENOUS | Status: DC | PRN
  Administered 2017-01-17: 100 mg via INTRAVENOUS
  Administered 2017-01-17: 20 mg via INTRAVENOUS
  Administered 2017-01-17: 60 mg via INTRAVENOUS
  Administered 2017-01-17: 40 mg via INTRAVENOUS
  Administered 2017-01-17: 30 mg via INTRAVENOUS

## 2017-01-17 MED ORDER — DEXLANSOPRAZOLE 60 MG PO CPDR: 60.0000 mg | DELAYED_RELEASE_CAPSULE | Freq: Every day | ORAL | 11 refills | Status: DC

## 2017-01-17 MED ORDER — LIDOCAINE HCL (CARDIAC) 20 MG/ML IV SOLN
INTRAVENOUS | Status: DC | PRN
  Administered 2017-01-17: 20 mg via INTRAVENOUS

## 2017-01-17 SURGICAL SUPPLY — 32 items
BALLN DILATOR 10-12 8 (BALLOONS)
BALLN DILATOR 12-15 8 (BALLOONS)
BALLN DILATOR 15-18 8 (BALLOONS) ×4
BALLN DILATOR CRE 0-12 8 (BALLOONS)
BALLN DILATOR ESOPH 8 10 CRE (MISCELLANEOUS) IMPLANT
BALLOON DILATOR 12-15 8 (BALLOONS) IMPLANT
BALLOON DILATOR 15-18 8 (BALLOONS) ×2 IMPLANT
BALLOON DILATOR CRE 0-12 8 (BALLOONS) IMPLANT
BLOCK BITE 60FR ADLT L/F GRN (MISCELLANEOUS) ×4 IMPLANT
CANISTER SUCT 1200ML W/VALVE (MISCELLANEOUS) ×4 IMPLANT
CLIP HMST 235XBRD CATH ROT (MISCELLANEOUS) IMPLANT
CLIP RESOLUTION 360 11X235 (MISCELLANEOUS)
FCP ESCP3.2XJMB 240X2.8X (MISCELLANEOUS)
FORCEPS BIOP RAD 4 LRG CAP 4 (CUTTING FORCEPS) ×4 IMPLANT
FORCEPS BIOP RJ4 240 W/NDL (MISCELLANEOUS)
FORCEPS ESCP3.2XJMB 240X2.8X (MISCELLANEOUS) IMPLANT
GOWN CVR UNV OPN BCK APRN NK (MISCELLANEOUS) ×4 IMPLANT
GOWN ISOL THUMB LOOP REG UNIV (MISCELLANEOUS) ×4
INJECTOR VARIJECT VIN23 (MISCELLANEOUS) IMPLANT
KIT DEFENDO VALVE AND CONN (KITS) IMPLANT
KIT ENDO PROCEDURE OLY (KITS) ×4 IMPLANT
MARKER SPOT ENDO TATTOO 5ML (MISCELLANEOUS) IMPLANT
PAD GROUND ADULT SPLIT (MISCELLANEOUS) IMPLANT
RETRIEVER NET PLAT FOOD (MISCELLANEOUS) IMPLANT
SNARE SHORT THROW 13M SML OVAL (MISCELLANEOUS) IMPLANT
SNARE SHORT THROW 30M LRG OVAL (MISCELLANEOUS) IMPLANT
SPOT EX ENDOSCOPIC TATTOO (MISCELLANEOUS)
SYR INFLATION 60ML (SYRINGE) ×4 IMPLANT
TRAP ETRAP POLY (MISCELLANEOUS) IMPLANT
VARIJECT INJECTOR VIN23 (MISCELLANEOUS)
WATER STERILE IRR 250ML POUR (IV SOLUTION) ×4 IMPLANT
WIRE CRE 18-20MM 8CM F G (MISCELLANEOUS) IMPLANT

## 2017-01-17 NOTE — Anesthesia Preprocedure Evaluation (Signed)
Anesthesia Evaluation  Patient identified by MRN, date of birth, ID band Patient awake    Reviewed: Allergy & Precautions, H&P , NPO status , Patient's Chart, lab work & pertinent test results  Airway Mallampati: II  TM Distance: >3 FB Neck ROM: full    Dental no notable dental hx.    Pulmonary former smoker,    Pulmonary exam normal        Cardiovascular hypertension, On Medications Normal cardiovascular exam     Neuro/Psych    GI/Hepatic Neg liver ROS, PUD,   Endo/Other  negative endocrine ROS  Renal/GU negative Renal ROS  negative genitourinary   Musculoskeletal   Abdominal   Peds  Hematology   Anesthesia Other Findings   Reproductive/Obstetrics                             Anesthesia Physical Anesthesia Plan  ASA: II  Anesthesia Plan: General   Post-op Pain Management:    Induction:   PONV Risk Score and Plan:   Airway Management Planned:   Additional Equipment:   Intra-op Plan:   Post-operative Plan:   Informed Consent:   Plan Discussed with:   Anesthesia Plan Comments:         Anesthesia Quick Evaluation

## 2017-01-17 NOTE — Anesthesia Procedure Notes (Signed)
Procedure Name: MAC Date/Time: 01/17/2017 9:32 AM Performed by: Janna Arch Pre-anesthesia Checklist: Patient identified, Emergency Drugs available, Suction available and Patient being monitored Patient Re-evaluated:Patient Re-evaluated prior to induction Oxygen Delivery Method: Nasal cannula

## 2017-01-17 NOTE — Op Note (Signed)
[facility] Gastroenterology Patient Name: Alan Trevino Procedure Date: 01/17/2017 9:29 AM MRN: [id number] Account #: [id number] Date of Birth: Mar 29, 1961 Admit Type: Outpatient Age: 56 Room: [facility] OR ROOM 01 Gender: Male Note Status: Finalized Procedure:            Upper GI endoscopy Indications:          Follow-up of acute peptic ulcer Providers:            [name] MD, MD Referring MD:         Juline Patch, MD (Referring MD) Medicines:            Propofol per Anesthesia Complications:        No immediate complications. Procedure:            Pre-Anesthesia Assessment:                       - Prior to the procedure, a History and Physical was                        performed, and patient medications and allergies were                        reviewed. The patient's tolerance of previous                        anesthesia was also reviewed. The risks and benefits of                        the procedure and the sedation options and risks were                        discussed with the patient. All questions were                        answered, and informed consent was obtained. Prior                        Anticoagulants: The patient has taken no previous                        anticoagulant or antiplatelet agents. ASA Grade                        Assessment: II - A patient with mild systemic disease.                        After reviewing the risks and benefits, the patient was                        deemed in satisfactory condition to undergo the                        procedure.                       After obtaining informed consent, the endoscope was                        passed under direct vision. Throughout the procedure,  the patient's blood pressure, pulse, and oxygen                        saturations were monitored continuously. The Olympus                        190 Endoscope [id number]) was introduced through the              mouth, and advanced to the second part of duodenum. The                        upper GI endoscopy was accomplished without difficulty.                        The patient tolerated the procedure well. Findings:      One moderate benign-appearing, intrinsic stenosis was found at the       gastroesophageal junction. And was traversed. A TTS dilator was passed       through the scope. Dilation with a 15-16.5-18 mm balloon dilator was       performed to 18 mm. The dilation site was examined following endoscope       reinsertion and showed complete resolution of luminal narrowing.      A 6 cm hiatal hernia was present.      Many non-bleeding superficial gastric ulcers with no stigmata of       bleeding were found in the gastric body. Biopsies were taken with a cold       forceps for histology.      The examined duodenum was normal. Impression:           - Benign-appearing esophageal stenosis. Dilated.                       - 6 cm hiatal hernia.                       - Non-bleeding gastric ulcers with no stigmata of                        bleeding. Biopsied.                       - Normal examined duodenum. Recommendation:       - Discharge patient to home.                       - Resume previous diet.                       - Continue present medications.                       - Await pathology results.                       - Serum gastrin levels. Procedure Code(s):    --- Professional ---                       [id number], Esophagogastroduodenoscopy, flexible, transoral;                        with transendoscopic balloon dilation of esophagus                        (  less than 30 mm diameter)                       43239, Esophagogastroduodenoscopy, flexible, transoral;                        with biopsy, single or multiple Diagnosis Code(s):    --- Professional ---                       K27.3, Acute peptic ulcer, site unspecified, without                        hemorrhage or  perforation                       K44.9, Diaphragmatic hernia without obstruction or                        gangrene                       K25.9, Gastric ulcer, unspecified as acute or chronic,                        without hemorrhage or perforation                       K22.2, Esophageal obstruction CPT copyright 2016 American Medical Association. All rights reserved. The codes documented in this report are preliminary and upon coder review may  be revised to meet current compliance requirements. [name] MD, MD 01/17/2017 9:46:02 AM This report has been signed electronically. Number of Addenda: 0 Note Initiated On: 01/17/2017 9:29 AM Total Procedure Duration: 0 hours 5 minutes 1 second       [facility]

## 2017-01-17 NOTE — Anesthesia Postprocedure Evaluation (Signed)
Anesthesia Post Note  Patient: Alan Trevino  Procedure(s) Performed: Procedure(s) (LRB): ESOPHAGOGASTRODUODENOSCOPY (EGD) WITH PROPOFOL (N/A) ESOPHAGEAL DILATION POLYPECTOMY  Patient location during evaluation: PACU Anesthesia Type: General Level of consciousness: awake and alert Pain management: pain level controlled Vital Signs Assessment: post-procedure vital signs reviewed and stable Respiratory status: spontaneous breathing Cardiovascular status: blood pressure returned to baseline Postop Assessment: no headache Anesthetic complications: no    Jaci Standard, III,  Mersades Barbaro D

## 2017-01-17 NOTE — H&P (Signed)
Alan Lame, MD [facility] [address], [place], [place] [address] Phone:[phone] Fax : [phone]  Primary Care Physician:  Juline Patch, MD Primary Gastroenterologist:  Dr. Allen Norris  Pre-Procedure History & Physical: HPI:  Alan Trevino is a 56 y.o. male is here for an endoscopy.   Past Medical History:  Diagnosis Date  . Depression   . H/O ulcer disease   . Hypertension   . SBO (small bowel obstruction) ([place])   . Wears dentures    full upper    Past Surgical History:  Procedure Laterality Date  . COLONOSCOPY WITH PROPOFOL N/A 12/29/2015   Procedure: COLONOSCOPY WITH PROPOFOL;  Surgeon: Alan Lame, MD;  Location: [place];  Service: Endoscopy;  Laterality: N/A;  . ESOPHAGEAL DILATION     stretching  . ESOPHAGOGASTRODUODENOSCOPY (EGD) WITH PROPOFOL N/A 11/29/2016   Procedure: ESOPHAGOGASTRODUODENOSCOPY (EGD) WITH PROPOFOL;  Surgeon: Alan Lame, MD;  Location: [facility] ENDOSCOPY;  Service: Endoscopy;  Laterality: N/A;  . HERNIA REPAIR    . POLYPECTOMY N/A 12/29/2015   Procedure: POLYPECTOMY;  Surgeon: Alan Lame, MD;  Location: [place];  Service: Endoscopy;  Laterality: N/A;  . SHOULDER SURGERY  09/2009    Prior to Admission medications   Medication Sig Start Date End Date Taking? Authorizing Provider  acetaminophen (TYLENOL) 500 MG tablet Take 500 mg by mouth every 6 (six) hours as needed.   Yes [provider]  baclofen (LIORESAL) 10 MG tablet TAKE 1 TABLET BY MOUTH EVERY DAY 01/16/17  Yes Juline Patch, MD  buPROPion (WELLBUTRIN XL) 300 MG 24 hr tablet Take 1 tablet by mouth daily. Dr Kasandra Knudsen 10/11/13  Yes [provider]  busPIRone (BUSPAR) 15 MG tablet Take 1 tablet by mouth 2 (two) times daily. Dr Kasandra Knudsen   Yes [provider]  methylphenidate (RITALIN) 20 MG tablet Take 1 tablet by mouth 2 (two) times daily. Dr Kasandra Knudsen   Yes [provider]  pantoprazole (PROTONIX) 40 MG tablet Take 1 tablet (40 mg total) by  mouth 2 (two) times daily before a meal. 11/30/16 01/29/17 Yes Vaughan Basta, MD    Allergies as of 12/13/2016  . (No Known Allergies)    Family History  Problem Relation Age of Onset  . Ovarian cancer Mother   . CAD Father     Social History   Social History  . Marital status: Divorced    Spouse name: N/A  . Number of children: N/A  . Years of education: N/A   Occupational History  . Not on file.   Social History Main Topics  . Smoking status: Former Smoker    Quit date: 2000  . Smokeless tobacco: Current User    Types: Chew  . Alcohol use 7.2 oz/week    12 Cans of beer per week  . Drug use: No  . Sexual activity: Not Currently   Other Topics Concern  . Not on file   Social History Narrative  . No narrative on file    Review of Systems: See HPI, otherwise negative ROS  Physical Exam: BP (!) 171/94   Pulse 61   Temp (!) 97.5 F (36.4 C) (Temporal)   Resp 16   Ht 5\' 3"  (1.6 m)   Wt 162 lb (73.5 kg)   SpO2 99%   BMI 28.70 kg/m  General:   Alert,  pleasant and cooperative in NAD Head:  Normocephalic and atraumatic. Neck:  Supple; no masses or thyromegaly. Lungs:  Clear throughout to auscultation.  Heart:  Regular rate and rhythm. Abdomen:  Soft, nontender and nondistended. Normal bowel sounds, without guarding, and without rebound.   Neurologic:  Alert and  oriented x4;  grossly normal neurologically.  Impression/Plan: Alan Trevino is here for an endoscopy to be performed for follow up on gastric ulcer  Risks, benefits, limitations, and alternatives regarding  endoscopy have been reviewed with the patient.  Questions have been answered.  All parties agreeable.   Alan Lame, MD  01/17/2017, 8:56 AM

## 2017-01-17 NOTE — Transfer of Care (Signed)
Immediate Anesthesia Transfer of Care Note  Patient: Alan Trevino  Procedure(s) Performed: Procedure(s): ESOPHAGOGASTRODUODENOSCOPY (EGD) WITH PROPOFOL, POLYPECTOMY (N/A) ESOPHAGEAL DILATION  Patient Location: PACU  Anesthesia Type: General  Level of Consciousness: awake, alert  and patient cooperative  Airway and Oxygen Therapy: Patient Spontanous Breathing and Patient connected to supplemental oxygen  Post-op Assessment: Post-op Vital signs reviewed, Patient's Cardiovascular Status Stable, Respiratory Function Stable, Patent Airway and No signs of Nausea or vomiting  Post-op Vital Signs: Reviewed and stable  Complications: No apparent anesthesia complications

## 2017-01-21 ENCOUNTER — Encounter: Payer: Self-pay | Admitting: Gastroenterology

## 2017-01-22 ENCOUNTER — Other Ambulatory Visit: Payer: Self-pay

## 2017-01-22 DIAGNOSIS — K257 Chronic gastric ulcer without hemorrhage or perforation: Secondary | ICD-10-CM

## 2017-01-27 LAB — GASTRIN: Gastrin: 109 pg/mL (ref 0–115)

## 2017-01-28 ENCOUNTER — Telehealth: Payer: Self-pay

## 2017-01-28 NOTE — Telephone Encounter (Signed)
Pt.notified

## 2017-01-28 NOTE — Telephone Encounter (Signed)
Let the patient know that he can try stopping the medication but if his heartburn comes back then he may need to go back on a PPI. We can try some generic PPI to see if that works also.

## 2017-01-28 NOTE — Telephone Encounter (Signed)
Pt notified of pathology results and labs.  Dr. Allen Norris, pt is wondering how long he will need to take the [place] as its very expensive. He also would like to know if he will need a repeat EGD?

## 2017-01-28 NOTE — Telephone Encounter (Signed)
-----   Message from [name], MD sent at 01/27/2017  8:34 AM EDT ----- Let the patient know the gastrin level was normal.

## 2017-01-28 NOTE — Telephone Encounter (Signed)
-----   Message from [name], MD sent at 01/27/2017  9:48 AM EDT ----- Let the patient know that his biopsies of the stomach did not show any infection but did show inflammation. If the patient is taking a PPI he should take it twice a day for the next month to facilitate healing.

## 2017-02-14 ENCOUNTER — Telehealth: Payer: Self-pay | Admitting: Gastroenterology

## 2017-02-14 NOTE — Telephone Encounter (Signed)
Patient left a voice message that he needs to get a refill but didn't say for what. Please call

## 2017-02-15 ENCOUNTER — Ambulatory Visit
Admission: EM | Admit: 2017-02-15 | Discharge: 2017-02-15 | Disposition: A | Discharge status: Home / Self Care | Payer: BLUE CROSS/BLUE SHIELD | Attending: Family Medicine | Admitting: Family Medicine

## 2017-02-15 ENCOUNTER — Encounter: Payer: Self-pay | Admitting: Emergency Medicine

## 2017-02-15 DIAGNOSIS — I10 Essential (primary) hypertension: Secondary | ICD-10-CM | POA: Diagnosis not present

## 2017-02-15 MED ORDER — CLONIDINE HCL 0.1 MG PO TABS
0.1000 mg | ORAL_TABLET | Freq: Once | ORAL | Status: AC
  Administered 2017-02-15: 0.1 mg via ORAL

## 2017-02-15 MED ORDER — AMLODIPINE BESY-BENAZEPRIL HCL 10-20 MG PO CAPS: 1.0000 | ORAL_CAPSULE | Freq: Every day | ORAL | 1 refills | Status: DC

## 2017-02-15 NOTE — ED Provider Notes (Signed)
[facility]    CSN: [id number] Arrival date & time: 02/15/17  1244     History   Chief Complaint Chief Complaint  Patient presents with  . Hypertension    HPI Alan Trevino is a 56 y.o. male.   Patient is a 55 year old male with a past history of hypertension, depression, ulcer disease who presents with complaint of hypertension. Patient states that upon discharge from the hospital in August for ulcers, his blood pressure medicine was stopped. He checked with his primary care doctor that time and was told that would be okay. At that point he was on Lotrel, and accommodation of amlodipine 10 mg and benazepril 20 mg daily. 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 He reports over the last few days he has noted his blood pressures been higher and this morning his blood pressure was 198/100 which she took a couple times with the same result. Patient reports she is taking his medication off and on but not on a normal basis since told he can stop. Patient reports that when he was previously on his medications his blood pressure would run mid 140s over the upper 70s to low 80s. Patient reports headache, pressure across the forehead. He denies any chest pain, shortness of breath, abdominal symptoms. Patient also denies any changes in vision, dizziness, and numbness and tingling. Patient reports he took one of his Lotrel medications this morning.      Past Medical History:  Diagnosis Date  . Depression   . H/O ulcer disease   . Hypertension   . SBO (small bowel obstruction) ([place])   . Wears dentures    full upper    Patient Active Problem List   Diagnosis Date Noted  . Acute peptic ulcer without hemorrhage and perforation   . Hiatal hernia   . Esophagogastric ulcer   . Stricture and stenosis of esophagus   . GIB (gastrointestinal bleeding) 11/30/2016  . Hematemesis without nausea   . Acute esophagogastric ulcer   . Upper GI bleed  11/28/2016  . Acute recurrent maxillary sinusitis 09/07/2016  . Special screening for malignant neoplasms, colon   . Benign neoplasm of ascending colon   . Benign neoplasm of transverse colon   . Benign neoplasm of descending colon   . Rectal polyp   . Lumbar disc disease 11/03/2015  . Iron deficiency anemia 08/25/2014  . Acute gastrointestinal bleeding 08/25/2014  . Acute back pain with sciatica 08/25/2014  . Routine general medical examination at a health care facility 08/25/2014  . 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 Essential (primary) hypertension 08/25/2014  . HLD (hyperlipidemia) 08/25/2014    Past Surgical History:  Procedure Laterality Date  . COLONOSCOPY WITH PROPOFOL N/A 12/29/2015   Procedure: COLONOSCOPY WITH PROPOFOL;  Surgeon: [name], MD;  Location: [place];  Service: Endoscopy;  Laterality: N/A;  . ESOPHAGEAL DILATION     stretching  . ESOPHAGEAL DILATION  01/17/2017   Procedure: ESOPHAGEAL DILATION;  Surgeon: [name], MD;  Location: [place];  Service: Gastroenterology;;  . ESOPHAGOGASTRODUODENOSCOPY (EGD) WITH PROPOFOL N/A 11/29/2016   Procedure: ESOPHAGOGASTRODUODENOSCOPY (EGD) WITH PROPOFOL;  Surgeon: [name], MD;  Location[facility] ENDOSCOPY;  Service: Endoscopy;  Laterality: N/A;  . ESOPHAGOGASTRODUODENOSCOPY (EGD) WITH PROPOFOL N/A 01/17/2017   Procedure: ESOPHAGOGASTRODUODENOSCOPY (EGD) WITH PROPOFOL;  Surgeon: [name], MD;  Location: [place];  Service: Gastroenterology;  Laterality: N/A;  .  HERNIA REPAIR    . POLYPECTOMY N/A 12/29/2015   Procedure: POLYPECTOMY;  Surgeon: [name], MD;  Location: [place];  Service: Endoscopy;  Laterality: N/A;  . POLYPECTOMY  01/17/2017   Procedure: POLYPECTOMY;  Surgeon: [name], MD;  Location: [place];  Service: Gastroenterology;;  . SHOULDER SURGERY  09/2009        Home Medications    Prior to Admission medications   Medication Sig Start Date End Date Taking? Authorizing Provider  acetaminophen (TYLENOL) 500 MG tablet Take 500 mg by mouth every 6 (six) hours as needed.   Yes [provider]  baclofen (LIORESAL) 10 MG tablet TAKE 1 TABLET BY MOUTH EVERY DAY 01/16/17  Yes Juline Patch, MD  buPROPion (WELLBUTRIN XL) 300 MG 24 hr tablet Take 1 tablet by mouth daily. Dr Kasandra Knudsen 10/11/13  Yes [provider]  busPIRone (BUSPAR) 15 MG tablet Take 1 tablet by mouth 2 (two) times daily. Dr Kasandra Knudsen   Yes [provider]  dexlansoprazole (DEXILANT) 60 MG capsule Take 1 capsule (60 mg total) by mouth daily. 01/17/17  Yes [name], MD  methylphenidate (RITALIN) 20 MG tablet Take 1 tablet by mouth 2 (two) times daily. Dr Kasandra Knudsen   Yes [provider]  amLODipine-benazepril (LOTREL) 10-20 MG capsule Take 1 capsule by mouth daily. 02/15/17   Luvenia Redden, PA-C  pantoprazole (PROTONIX) 40 MG tablet Take 1 tablet (40 mg total) by mouth 2 (two) times daily before a meal. 11/30/16 01/29/17  Vaughan Basta, MD    Family History Family History  Problem Relation Age of Onset  . Ovarian cancer Mother   . CAD Father     Social History Social History  Substance Use Topics  . Smoking status: Former Smoker    Quit date: 2000  . Smokeless tobacco: Current User    Types: Chew  . Alcohol use 7.2 oz/week    12 Cans of beer per week     Allergies   Patient has no known allergies.   Review of Systems Review of Systems  As noted above in history of present illness. Other systems reviewed and found to be negative.   Physical Exam Triage Vital Signs ED Triage Vitals  Enc Vitals Group     BP 02/15/17 1317 (!) 172/90     Pulse Rate 02/15/17 1317 65     Resp 02/15/17 1317 18     Temp 02/15/17 1317 99.1 F (37.3 C)     Temp Source 02/15/17 1317 Oral     SpO2 02/15/17 1317 98 %     Weight 02/15/17 1321 165 lb (74.8  kg)     Height 02/15/17 1321 5\' 3"  (1.6 m)     Head Circumference --      Peak Flow --      Pain Score --      Pain Loc --      Pain Edu? --      Excl. in [place]? --    No data found.   Updated Vital Signs BP (!) 172/90 (BP Location: Left Arm)   Pulse 65   Temp 99.1 F (37.3 C) (Oral)   Resp 18   Ht 5\' 3"  (1.6 m)   Wt 165 lb (74.8 kg)   SpO2 98%   BMI 29.23 kg/m    Physical Exam  Constitutional: He is oriented to person, place, and time. He appears well-developed and well-nourished. No distress.  Appears flushed with diffuse erythema. Patient reports  this is not his normal coloration.  HENT:  Head: Normocephalic and atraumatic.  Eyes: Pupils are equal, round, and reactive to light. EOM are normal.  Cardiovascular: Normal rate, regular rhythm and normal heart sounds.  Exam reveals no friction rub.   No murmur heard. Pulmonary/Chest: Effort normal and breath sounds normal. No respiratory distress. He has no wheezes.  Abdominal: Soft. He exhibits no distension.  Musculoskeletal: Normal range of motion.  Neurological: He is alert and oriented to person, place, and time. No cranial nerve deficit.  Skin: Skin is warm and dry.     UC Treatments / Results  Labs (all labs ordered are listed, but only abnormal results are displayed) Labs Reviewed - No data to display  EKG  EKG Interpretation None       Radiology No results found.  Procedures Procedures (including critical care time)  Medications Ordered in UC Medications  cloNIDine (CATAPRES) tablet 0.1 mg (0.1 mg Oral Given 02/15/17 1356)    Initial Impression / Assessment and Plan / UC Course  I have reviewed the triage vital signs and the nursing notes.  Pertinent labs & imaging results that were available during my care of the patient were reviewed by me and considered in my medical decision making (see chart for details).    Patient stopped taking his antihypertensive after a discharge in August per his  discharge instructions. This was cleared with his PCP. However he is felt headaches has increased blood pressure last couple days. He restarted taking his meds today.  Final Clinical Impressions(s) / UC Diagnoses   Final diagnoses:  Essential hypertension   Will give patient a prescription for his Lotrel and have him call and make an appointment with his primary care doctor this week. Patient also given warning regarding presenting to the ER should he have any symptoms of hypertensive urgency or emergency.  New Prescriptions New Prescriptions   AMLODIPINE-BENAZEPRIL (LOTREL) 10-20 MG CAPSULE    Take 1 capsule by mouth daily.     Controlled Substance Prescriptions [place] Controlled Substance Registry consulted? Not Applicable   Luvenia Redden, PA-C 02/15/17 1405

## 2017-02-15 NOTE — Discharge Instructions (Signed)
-  Lotrel: one tablet daily for blood pressure -Contact Dr. Ronnald Ramp for a follow up appointment this week -keep a blood pressure log, using the same machine and taking your blood pressure at the same time each day. If you can get a couple readings per day, that would be better. -If you have any symptoms of severe HA, cheat pain, shortness of breath, dizziness, numbness or tingling (especially on one side of the body), trouble speaking, etc then present to the nearest ER or call 911.

## 2017-02-15 NOTE — ED Triage Notes (Signed)
Blood pressure has been elevated at 198/101 since Blood pressure medication was stopped 2 month ago. Was taking Amlodipine 10 mg qd and started back taking that today.

## 2017-02-21 NOTE — Telephone Encounter (Signed)
Rx sent to pt's pharmacy

## 2017-03-28 ENCOUNTER — Ambulatory Visit (INDEPENDENT_AMBULATORY_CARE_PROVIDER_SITE_OTHER): Payer: BLUE CROSS/BLUE SHIELD | Admitting: Family Medicine

## 2017-03-28 ENCOUNTER — Encounter: Payer: Self-pay | Admitting: Family Medicine

## 2017-03-28 VITALS — BP 130/70 | HR 76 | Ht 63.0 in | Wt 171.0 lb

## 2017-03-28 DIAGNOSIS — Z23 Encounter for immunization: Secondary | ICD-10-CM

## 2017-03-28 DIAGNOSIS — I10 Essential (primary) hypertension: Secondary | ICD-10-CM | POA: Diagnosis not present

## 2017-03-28 DIAGNOSIS — S39012D Strain of muscle, fascia and tendon of lower back, subsequent encounter: Secondary | ICD-10-CM

## 2017-03-28 MED ORDER — CYCLOBENZAPRINE HCL 10 MG PO TABS: 10.0000 mg | ORAL_TABLET | Freq: Every day | ORAL | 2 refills | Status: DC

## 2017-03-28 MED ORDER — AMLODIPINE BESY-BENAZEPRIL HCL 10-20 MG PO CAPS: 1.0000 | ORAL_CAPSULE | Freq: Every day | ORAL | 1 refills | Status: DC

## 2017-03-28 NOTE — Progress Notes (Signed)
Name: Alan Trevino   MRN: [id number]    DOB: 1961-03-16   Date:03/28/2017       Progress Note  Subjective  Chief Complaint  Chief Complaint  Patient presents with  . Hypertension    was started back on B/P med x 6 weeks ago by urgent care  . Back Pain    is taking Baclofen at night for back pain- wants to increase to 20mg     Hypertension  This is a chronic problem. The current episode started more than 1 year ago. The problem has been waxing and waning since onset. The problem is controlled. Pertinent negatives include no anxiety, blurred vision, chest pain, headaches, malaise/fatigue, neck pain, orthopnea, palpitations, peripheral edema, PND, shortness of breath or sweats. There are no associated agents to hypertension. Past treatments include calcium channel blockers and ACE inhibitors. The current treatment provides moderate improvement. There are no compliance problems.  There is no history of angina, kidney disease, CAD/MI, CVA, heart failure, left ventricular hypertrophy, PVD or retinopathy. There is no history of chronic renal disease, a hypertension causing med or renovascular disease.  Back Pain  This is a chronic problem. The current episode started more than 1 year ago. The problem occurs intermittently. The problem has been waxing and waning since onset. The pain is present in the lumbar spine. The quality of the pain is described as aching. The pain is moderate. The symptoms are aggravated by bending. Pertinent negatives include no abdominal pain, bladder incontinence, bowel incontinence, chest pain, dysuria, fever, headaches, leg pain, numbness, paresis, paresthesias, pelvic pain, perianal numbness, tingling, weakness or weight loss. He has tried muscle relaxant for the symptoms.    No problem-specific Assessment & Plan notes found for this encounter.   Past Medical History:  Diagnosis Date  . Depression   . H/O ulcer disease   . Hypertension   . SBO (small bowel  obstruction) ([place])   . Wears dentures    full upper    Past Surgical History:  Procedure Laterality Date  . COLONOSCOPY WITH PROPOFOL N/A 12/29/2015   Procedure: COLONOSCOPY WITH PROPOFOL;  Surgeon: [name], MD;  Location: [place];  Service: Endoscopy;  Laterality: N/A;  . ESOPHAGEAL DILATION     stretching  . ESOPHAGEAL DILATION  01/17/2017   Procedure: ESOPHAGEAL DILATION;  Surgeon: [name], MD;  Location: [place];  Service: Gastroenterology;;  . ESOPHAGOGASTRODUODENOSCOPY (EGD) WITH PROPOFOL N/A 11/29/2016   Procedure: ESOPHAGOGASTRODUODENOSCOPY (EGD) WITH PROPOFOL;  Surgeon: [name], MD;  [facility]: [facility] ENDOSCOPY;  Service: Endoscopy;  Laterality: N/A;  . ESOPHAGOGASTRODUODENOSCOPY (EGD) WITH PROPOFOL N/A 01/17/2017   Procedure: ESOPHAGOGASTRODUODENOSCOPY (EGD) WITH PROPOFOL;  Surgeon: [name], MD;  Location: [place];  Service: Gastroenterology;  Laterality: N/A;  . HERNIA REPAIR    . POLYPECTOMY N/A 12/29/2015   Procedure: POLYPECTOMY;  Surgeon: [name], MD;  Location: [place];  Service: Endoscopy;  Laterality: N/A;  . POLYPECTOMY  01/17/2017   Procedure: POLYPECTOMY;  Surgeon: [name], MD;  Location: [place];  Service: Gastroenterology;;  . SHOULDER SURGERY  09/2009    Family History  Problem Relation Age of Onset  . Ovarian cancer Mother   . CAD Father     Social History   Socioeconomic History  . Marital status: Divorced    Spouse name: Not on file  . Number of children: Not on file  . Years of education: Not on file  . Highest education level: Not on file  Social Needs  . Financial resource strain: Not on file  . Food insecurity - worry: Not on file  . Food insecurity - inability: Not on file  . Transportation needs - medical: Not on file  . Transportation needs - non-medical: Not on file  Occupational History  . Not on file  Tobacco Use  . Smoking status: Former Smoker    Last  attempt to quit: 2000    Years since quitting: 18.9  . Smokeless tobacco: Current User    Types: Chew  Substance and Sexual Activity  . Alcohol use: Yes    Alcohol/week: 7.2 oz    Types: 12 Cans of beer per week  . Drug use: No  . Sexual activity: Not Currently  Other Topics Concern  . Not on file  Social History Narrative  . Not on file    No Known Allergies  Outpatient Medications Prior to Visit  Medication Sig Dispense Refill  . acetaminophen (TYLENOL) 500 MG tablet Take 500 mg by mouth every 6 (six) hours as needed.    Marland Kitchen buPROPion (WELLBUTRIN XL) 300 MG 24 hr tablet Take 1 tablet by mouth daily. Dr Kasandra Knudsen    . busPIRone (BUSPAR) 15 MG tablet Take 1 tablet by mouth 2 (two) times daily. Dr Kasandra Knudsen    . dexlansoprazole (DEXILANT) 60 MG capsule Take 1 capsule (60 mg total) by mouth daily. 30 capsule 11  . methylphenidate (RITALIN) 20 MG tablet Take 1 tablet by mouth 2 (two) times daily. Dr Kasandra Knudsen    . amLODipine-benazepril (LOTREL) 10-20 MG capsule Take 1 capsule by mouth daily. 30 capsule 1  . baclofen (LIORESAL) 10 MG tablet TAKE 1 TABLET BY MOUTH EVERY DAY 30 tablet 5  . pantoprazole (PROTONIX) 40 MG tablet Take 1 tablet (40 mg total) by mouth 2 (two) times daily before a meal. 60 tablet 1   No facility-administered medications prior to visit.     Review of Systems  Constitutional: Negative for chills, fever, malaise/fatigue and weight loss.  HENT: Negative for ear discharge, ear pain and sore throat.   Eyes: Negative for blurred vision.  Respiratory: Negative for cough, sputum production, shortness of breath and wheezing.   Cardiovascular: Negative for chest pain, palpitations, orthopnea, leg swelling and PND.  Gastrointestinal: Negative for abdominal pain, blood in stool, bowel incontinence, constipation, diarrhea, heartburn, melena and nausea.  Genitourinary: Negative for bladder incontinence, dysuria, frequency, hematuria, pelvic pain and urgency.  Musculoskeletal: Positive for back  pain. Negative for joint pain, myalgias and neck pain.  Skin: Negative for rash.  Neurological: Negative for dizziness, tingling, sensory change, focal weakness, weakness, numbness, headaches and paresthesias.  Endo/Heme/Allergies: Negative for environmental allergies and polydipsia. Does not bruise/bleed easily.  Psychiatric/Behavioral: Negative for depression and suicidal ideas. The patient is not nervous/anxious and does not have insomnia.      Objective  Vitals:   03/28/17 0903  BP: 130/70  Pulse: 76  Weight: 171 lb (77.6 kg)  Height: 5\' 3"  (1.6 m)    Physical Exam  Constitutional: He is oriented to person, place, and time and well-developed, well-nourished, and in no distress.  HENT:  Head: Normocephalic.  Right Ear: External ear normal.  Left Ear: External ear normal.  Nose: Nose normal.  Mouth/Throat: Oropharynx is clear and moist.  Eyes: Conjunctivae and EOM are normal. Pupils are equal, round, and reactive to light. Right eye exhibits no discharge. Left eye exhibits no discharge. No scleral icterus.  Neck: Normal range of motion. Neck supple. No JVD  present. No tracheal deviation present. No thyromegaly present.  Cardiovascular: Normal rate, regular rhythm, normal heart sounds and intact distal pulses. Exam reveals no gallop and no friction rub.  No murmur heard. Pulmonary/Chest: Breath sounds normal. No respiratory distress. He has no wheezes. He has no rales.  Abdominal: Soft. Bowel sounds are normal. He exhibits no mass. There is no hepatosplenomegaly. There is no tenderness. There is no rebound, no guarding and no CVA tenderness.  Musculoskeletal: Normal range of motion. He exhibits no edema or tenderness.  Lymphadenopathy:    He has no cervical adenopathy.  Neurological: He is alert and oriented to person, place, and time. He has normal sensation, normal strength, normal reflexes and intact cranial nerves. No cranial nerve deficit.  Skin: Skin is warm. No rash noted.   Psychiatric: Mood and affect normal.  Nursing note and vitals reviewed.     Assessment & Plan  Problem List Items Addressed This Visit    None    Visit Diagnoses    Essential hypertension    -  Primary   Relevant Medications   amLODipine-benazepril (LOTREL) 10-20 MG capsule   Strain of lumbar region, subsequent encounter       Relevant Medications   cyclobenzaprine (FLEXERIL) 10 MG tablet   Influenza vaccine needed       Relevant Orders   Flu Vaccine QUAD 36+ mos IM (Completed)      Meds ordered this encounter  Medications  . amLODipine-benazepril (LOTREL) 10-20 MG capsule    Sig: Take 1 capsule by mouth daily.    Dispense:  90 capsule    Refill:  1  . cyclobenzaprine (FLEXERIL) 10 MG tablet    Sig: Take 1 tablet (10 mg total) by mouth at bedtime.    Dispense:  30 tablet    Refill:  2      Dr. Mac[facility]  03/28/17

## 2017-05-16 ENCOUNTER — Other Ambulatory Visit: Payer: Self-pay

## 2017-06-27 ENCOUNTER — Other Ambulatory Visit: Payer: Self-pay | Admitting: Family Medicine

## 2017-08-20 ENCOUNTER — Other Ambulatory Visit: Payer: Self-pay | Admitting: Family Medicine

## 2017-08-20 DIAGNOSIS — S39012D Strain of muscle, fascia and tendon of lower back, subsequent encounter: Secondary | ICD-10-CM

## 2017-10-18 ENCOUNTER — Other Ambulatory Visit: Payer: Self-pay | Admitting: Family Medicine

## 2017-10-18 DIAGNOSIS — S39012D Strain of muscle, fascia and tendon of lower back, subsequent encounter: Secondary | ICD-10-CM

## 2017-12-16 ENCOUNTER — Other Ambulatory Visit: Payer: Self-pay | Admitting: Family Medicine

## 2017-12-16 DIAGNOSIS — S39012D Strain of muscle, fascia and tendon of lower back, subsequent encounter: Secondary | ICD-10-CM

## 2017-12-25 ENCOUNTER — Other Ambulatory Visit: Payer: Self-pay | Admitting: Family Medicine

## 2017-12-25 DIAGNOSIS — I10 Essential (primary) hypertension: Secondary | ICD-10-CM

## 2018-01-02 ENCOUNTER — Encounter: Payer: Self-pay | Admitting: Family Medicine

## 2018-01-02 ENCOUNTER — Ambulatory Visit (INDEPENDENT_AMBULATORY_CARE_PROVIDER_SITE_OTHER): Payer: BLUE CROSS/BLUE SHIELD | Admitting: Family Medicine

## 2018-01-02 VITALS — BP 122/82 | HR 88 | Resp 16 | Ht 63.0 in | Wt 170.3 lb

## 2018-01-02 DIAGNOSIS — I10 Essential (primary) hypertension: Secondary | ICD-10-CM | POA: Diagnosis not present

## 2018-01-02 DIAGNOSIS — Z23 Encounter for immunization: Secondary | ICD-10-CM

## 2018-01-02 DIAGNOSIS — S40812A Abrasion of left upper arm, initial encounter: Secondary | ICD-10-CM

## 2018-01-02 DIAGNOSIS — Z1159 Encounter for screening for other viral diseases: Secondary | ICD-10-CM | POA: Diagnosis not present

## 2018-01-02 DIAGNOSIS — M519 Unspecified thoracic, thoracolumbar and lumbosacral intervertebral disc disorder: Secondary | ICD-10-CM

## 2018-01-02 MED ORDER — AMLODIPINE BESY-BENAZEPRIL HCL 10-20 MG PO CAPS: ORAL_CAPSULE | ORAL | 1 refills | Status: DC

## 2018-01-02 MED ORDER — CYCLOBENZAPRINE HCL 5 MG PO TABS: 10.0000 mg | ORAL_TABLET | Freq: Every day | ORAL | 1 refills | Status: DC

## 2018-01-02 NOTE — Assessment & Plan Note (Signed)
Chronic Controlled Continue cyclobenzaprine 5 mg Q12-24 hrs.

## 2018-01-02 NOTE — Progress Notes (Signed)
Name: Alan Trevino   MRN: [id number]    DOB: 1961/04/17   Date:01/02/2018       Progress Note  Subjective  Chief Complaint  Chief Complaint  Patient presents with  . Hypertension    Hypertension  This is a chronic problem. The current episode started more than 1 year ago. The problem has been gradually improving since onset. The problem is controlled. Associated symptoms include neck pain. Pertinent negatives include no anxiety, blurred vision, chest pain, headaches, malaise/fatigue, orthopnea, palpitations, peripheral edema, PND, shortness of breath or sweats. There are no associated agents to hypertension. Risk factors for coronary artery disease include dyslipidemia, obesity and post-menopausal state. Past treatments include calcium channel blockers and ACE inhibitors. The current treatment provides moderate improvement. There are no compliance problems.  There is no history of angina, kidney disease, CAD/MI, CVA, heart failure, left ventricular hypertrophy, PVD or retinopathy. There is no history of chronic renal disease, a hypertension causing med or renovascular disease.  Hyperlipidemia  This is a chronic problem. The current episode started more than 1 year ago. The problem is uncontrolled. Recent lipid tests were reviewed and are high. He has no history of chronic renal disease, diabetes, hypothyroidism, liver disease, obesity or nephrotic syndrome. Factors aggravating his hyperlipidemia include thiazides. Pertinent negatives include no chest pain, focal sensory loss, focal weakness, leg pain, myalgias or shortness of breath. Current antihyperlipidemic treatment includes diet change. The current treatment provides no improvement of lipids. There are no compliance problems.  Risk factors for coronary artery disease include dyslipidemia, male sex, hypertension and obesity.  Back Pain  This is a chronic problem. The current episode started more than 1 year ago. The problem occurs  intermittently. The problem has been waxing and waning since onset. The pain is present in the lumbar spine and thoracic spine. The quality of the pain is described as aching. The pain does not radiate. The pain is at a severity of 5/10. The pain is moderate. The symptoms are aggravated by lying down. Pertinent negatives include no abdominal pain, bladder incontinence, bowel incontinence, chest pain, dysuria, fever, headaches, leg pain, numbness, paresis, paresthesias, pelvic pain, perianal numbness, tingling, weakness or weight loss. He has tried muscle relaxant for the symptoms. The treatment provided moderate relief.  Neck Pain   This is a chronic problem. The current episode started more than 1 year ago. The problem occurs intermittently. The problem has been waxing and waning. The pain is moderate. Pertinent negatives include no chest pain, fever, headaches, leg pain, numbness, paresis, tingling, weakness or weight loss.    Essential (primary) hypertension Chronic Controlled Continue LOTREL 10-20 mg daily . Check renal panel  Lumbar disc disease Chronic Controlled Continue cyclobenzaprine 5 mg Q12-24 hrs.   Past Medical History:  Diagnosis Date  . Depression   . H/O ulcer disease   . Hypertension   . SBO (small bowel obstruction) ([place])   . Wears dentures    full upper    Past Surgical History:  Procedure Laterality Date  . COLONOSCOPY WITH PROPOFOL N/A 12/29/2015   Procedure: COLONOSCOPY WITH PROPOFOL;  Surgeon: [name], MD;  Location: [place];  Service: Endoscopy;  Laterality: N/A;  . ESOPHAGEAL DILATION     stretching  . ESOPHAGEAL DILATION  01/17/2017   Procedure: ESOPHAGEAL DILATION;  Surgeon: [name], MD;  Location: [place];  Service: Gastroenterology;;  . ESOPHAGOGASTRODUODENOSCOPY (EGD) WITH PROPOFOL N/A 11/29/2016   Procedure: ESOPHAGOGASTRODUODENOSCOPY (EGD) WITH PROPOFOL;  Surgeon: [name], MD;  Location: [facility] ENDOSCOPY;  Service:  Endoscopy;  Laterality: N/A;  . ESOPHAGOGASTRODUODENOSCOPY (EGD) WITH PROPOFOL N/A 01/17/2017   Procedure: ESOPHAGOGASTRODUODENOSCOPY (EGD) WITH PROPOFOL;  Surgeon: [name], MD;  Location: [place];  Service: Gastroenterology;  Laterality: N/A;  . HERNIA REPAIR    . POLYPECTOMY N/A 12/29/2015   Procedure: POLYPECTOMY;  Surgeon: [name], MD;  Location: [place];  Service: Endoscopy;  Laterality: N/A;  . POLYPECTOMY  01/17/2017   Procedure: POLYPECTOMY;  Surgeon: [name], MD;  Location: [place];  Service: Gastroenterology;;  . SHOULDER SURGERY  09/2009    Family History  Problem Relation Age of Onset  . Ovarian cancer Mother   . CAD Father     Social History   Socioeconomic History  . Marital status: Divorced    Spouse name: Not on file  . Number of children: Not on file  . Years of education: Not on file  . Highest education level: Not on file  Occupational History  . Not on file  Social Needs  . Financial resource strain: Not on file  . Food insecurity:    Worry: Not on file    Inability: Not on file  . Transportation needs:    Medical: Not on file    Non-medical: Not on file  Tobacco Use  . Smoking status: Former Smoker    Last attempt to quit: 2000    Years since quitting: 19.7  . Smokeless tobacco: Current User    Types: Chew  Substance and Sexual Activity  . Alcohol use: Yes    Alcohol/week: 12.0 standard drinks    Types: 12 Cans of beer per week  . Drug use: No  . Sexual activity: Not Currently  Lifestyle  . Physical activity:    Days per week: Not on file    Minutes per session: Not on file  . Stress: Not on file  Relationships  . Social connections:    Talks on phone: Not on file    Gets together: Not on file    Attends religious service: Not on file    Active member of club or organization: Not on file    Attends meetings of clubs or organizations: Not on file    Relationship status: Not on file  .  Intimate partner violence:    Fear of current or ex partner: Not on file    Emotionally abused: Not on file    Physically abused: Not on file    Forced sexual activity: Not on file  Other Topics Concern  . Not on file  Social History Narrative  . Not on file    No Known Allergies  Outpatient Medications Prior to Visit  Medication Sig Dispense Refill  . acetaminophen (TYLENOL) 500 MG tablet Take 500 mg by mouth every 6 (six) hours as needed.    Marland Kitchen buPROPion (WELLBUTRIN XL) 300 MG 24 hr tablet Take 1 tablet by mouth daily. Dr Kasandra Knudsen    . busPIRone (BUSPAR) 15 MG tablet Take 1 tablet by mouth 2 (two) times daily. Dr Kasandra Knudsen    . methylphenidate (RITALIN) 20 MG tablet Take 1 tablet by mouth 2 (two) times daily. Dr Kasandra Knudsen    . amLODipine-benazepril (LOTREL) 10-20 MG capsule TAKE 1 CAPSULE BY MOUTH EVERY DAY 30 capsule 0  . cyclobenzaprine (FLEXERIL) 5 MG tablet TAKE 2 TABLETS BY MOUTH AT BEDTIME 60 tablet 0  . dexlansoprazole (DEXILANT) 60 MG capsule Take 1 capsule (60 mg total) by mouth daily. 30 capsule  11   No facility-administered medications prior to visit.     Review of Systems  Constitutional: Negative for chills, fever, malaise/fatigue and weight loss.  HENT: Negative for ear discharge, ear pain and sore throat.   Eyes: Negative for blurred vision.  Respiratory: Negative for cough, sputum production, shortness of breath and wheezing.   Cardiovascular: Negative for chest pain, palpitations, orthopnea, leg swelling and PND.  Gastrointestinal: Negative for abdominal pain, blood in stool, bowel incontinence, constipation, diarrhea, heartburn, melena and nausea.  Genitourinary: Negative for bladder incontinence, dysuria, frequency, hematuria, pelvic pain and urgency.  Musculoskeletal: Positive for back pain and neck pain. Negative for joint pain and myalgias.  Skin: Negative for rash.  Neurological: Negative for dizziness, tingling, sensory change, focal weakness, weakness, numbness, headaches  and paresthesias.  Endo/Heme/Allergies: Negative for environmental allergies and polydipsia. Does not bruise/bleed easily.  Psychiatric/Behavioral: Negative for depression and suicidal ideas. The patient is not nervous/anxious and does not have insomnia.      Objective  Vitals:   01/02/18 1045  BP: 122/82  Pulse: 88  Resp: 16  SpO2: 97%  Weight: 170 lb 4.8 oz (77.2 kg)  Height: 5\' 3"  (1.6 m)    Physical Exam  Constitutional: He is oriented to person, place, and time. He appears well-developed and well-nourished.  HENT:  Head: Normocephalic.  Right Ear: External ear normal.  Left Ear: External ear normal.  Nose: Nose normal.  Mouth/Throat: Oropharynx is clear and moist.  Eyes: Pupils are equal, round, and reactive to light. Conjunctivae and EOM are normal. Right eye exhibits no discharge. Left eye exhibits no discharge. No scleral icterus.  Neck: Normal range of motion. Neck supple. No JVD present. No tracheal deviation present. No thyromegaly present.  Cardiovascular: Normal rate, regular rhythm, normal heart sounds and intact distal pulses. Exam reveals no gallop and no friction rub.  No murmur heard. Pulmonary/Chest: Breath sounds normal. No respiratory distress. He has no wheezes. He has no rales.  Abdominal: Soft. Bowel sounds are normal. He exhibits no mass. There is no hepatosplenomegaly. There is no tenderness. There is no rebound, no guarding and no CVA tenderness.  Musculoskeletal: Normal range of motion. He exhibits no edema or tenderness.  Lymphadenopathy:    He has no cervical adenopathy.  Neurological: He is alert and oriented to person, place, and time. He has normal strength and normal reflexes. No cranial nerve deficit.  Skin: Skin is warm. No rash noted.      Assessment & Plan  Problem List Items Addressed This Visit      Cardiovascular and Mediastinum   Essential (primary) hypertension    Chronic Controlled Continue LOTREL 10-20 mg daily . Check renal  panel      Relevant Medications   amLODipine-benazepril (LOTREL) 10-20 MG capsule     Musculoskeletal and Integument   Lumbar disc disease    Chronic Controlled Continue cyclobenzaprine 5 mg Q12-24 hrs.      Relevant Medications   cyclobenzaprine (FLEXERIL) 5 MG tablet    Other Visit Diagnoses    Essential hypertension    -  Primary   Chronic Controlled   Relevant Medications   amLODipine-benazepril (LOTREL) 10-20 MG capsule   Other Relevant Orders   Renal Function Panel   Need for hepatitis C screening test       Patient meets critera to screen. Hepatitis antibody check.   Relevant Orders   Hepatitis C antibody   Abrasion of left upper extremity, initial encounter  Acute. Caught on door handle. Dressing applied. Tdap administered.   Relevant Orders   Tdap vaccine greater than or equal to 7yo IM (Completed)   Need for diphtheria-tetanus-pertussis (Tdap) vaccine       See recent abrasion. tdap given   Relevant Orders   Tdap vaccine greater than or equal to 7yo IM (Completed)   Influenza vaccine needed       discussed and administered   Relevant Orders   Flu Vaccine QUAD 36+ mos IM (Completed)      Meds ordered this encounter  Medications  . amLODipine-benazepril (LOTREL) 10-20 MG capsule    Sig: TAKE 1 CAPSULE BY MOUTH EVERY DAY    Dispense:  90 capsule    Refill:  1    Only 30 - needs appt  . cyclobenzaprine (FLEXERIL) 5 MG tablet    Sig: Take 2 tablets (10 mg total) by mouth at bedtime.    Dispense:  180 tablet    Refill:  1      Dr. Otilio Miu [facility]  01/02/18

## 2018-01-02 NOTE — Assessment & Plan Note (Signed)
>>  ASSESSMENT AND PLAN FOR LUMBAR DISC DISEASE WRITTEN ON 01/02/2018 11:26 AM BY JOSHUA ROCA C, MD  Chronic Controlled Continue cyclobenzaprine  5 mg Q12-24 hrs.

## 2018-01-02 NOTE — Assessment & Plan Note (Signed)
Chronic Controlled Continue LOTREL 10-20 mg daily . Check renal panel

## 2018-01-03 LAB — RENAL FUNCTION PANEL
Albumin: 4.6 g/dL (ref 3.5–5.5)
BUN/Creatinine Ratio: 11 (ref 9–20)
BUN: 14 mg/dL (ref 6–24)
CO2: 23 mmol/L (ref 20–29)
Calcium: 9.6 mg/dL (ref 8.7–10.2)
Chloride: 102 mmol/L (ref 96–106)
Creatinine, Ser: 1.25 mg/dL (ref 0.76–1.27)
GFR calc Af Amer: 73 mL/min/{1.73_m2} (ref >59)
GFR calc non Af Amer: 63 mL/min/{1.73_m2} (ref >59)
Glucose: 87 mg/dL (ref 65–99)
Phosphorus: 3.7 mg/dL (ref 2.5–4.5)
Potassium: 4.3 mmol/L (ref 3.5–5.2)
Sodium: 140 mmol/L (ref 134–144)

## 2018-01-03 LAB — HEPATITIS C ANTIBODY: Hep C Virus Ab: <0.1 s/co ratio (ref 0.0–0.9)

## 2018-01-30 ENCOUNTER — Ambulatory Visit
Admission: EM | Admit: 2018-01-30 | Discharge: 2018-01-30 | Disposition: A | Discharge status: Home / Self Care | Payer: BLUE CROSS/BLUE SHIELD | Attending: Family Medicine | Admitting: Family Medicine

## 2018-01-30 ENCOUNTER — Ambulatory Visit (INDEPENDENT_AMBULATORY_CARE_PROVIDER_SITE_OTHER): Payer: BLUE CROSS/BLUE SHIELD

## 2018-01-30 ENCOUNTER — Other Ambulatory Visit: Payer: Self-pay

## 2018-01-30 DIAGNOSIS — M5137 Other intervertebral disc degeneration, lumbosacral region: Secondary | ICD-10-CM | POA: Diagnosis not present

## 2018-01-30 DIAGNOSIS — Z8249 Family history of ischemic heart disease and other diseases of the circulatory system: Secondary | ICD-10-CM | POA: Diagnosis not present

## 2018-01-30 DIAGNOSIS — R51 Headache: Secondary | ICD-10-CM

## 2018-01-30 DIAGNOSIS — R0602 Shortness of breath: Secondary | ICD-10-CM | POA: Diagnosis not present

## 2018-01-30 DIAGNOSIS — Z87891 Personal history of nicotine dependence: Secondary | ICD-10-CM | POA: Insufficient documentation

## 2018-01-30 DIAGNOSIS — D123 Benign neoplasm of transverse colon: Secondary | ICD-10-CM | POA: Insufficient documentation

## 2018-01-30 DIAGNOSIS — K228 Other specified diseases of esophagus: Secondary | ICD-10-CM | POA: Diagnosis not present

## 2018-01-30 DIAGNOSIS — D124 Benign neoplasm of descending colon: Secondary | ICD-10-CM | POA: Diagnosis not present

## 2018-01-30 DIAGNOSIS — Z8041 Family history of malignant neoplasm of ovary: Secondary | ICD-10-CM | POA: Insufficient documentation

## 2018-01-30 DIAGNOSIS — R0600 Dyspnea, unspecified: Secondary | ICD-10-CM

## 2018-01-30 DIAGNOSIS — E785 Hyperlipidemia, unspecified: Secondary | ICD-10-CM | POA: Diagnosis not present

## 2018-01-30 DIAGNOSIS — Z79899 Other long term (current) drug therapy: Secondary | ICD-10-CM | POA: Diagnosis not present

## 2018-01-30 DIAGNOSIS — L02414 Cutaneous abscess of left upper limb: Secondary | ICD-10-CM

## 2018-01-30 DIAGNOSIS — R519 Headache, unspecified: Secondary | ICD-10-CM

## 2018-01-30 DIAGNOSIS — I10 Essential (primary) hypertension: Secondary | ICD-10-CM | POA: Insufficient documentation

## 2018-01-30 DIAGNOSIS — Z801 Family history of malignant neoplasm of trachea, bronchus and lung: Secondary | ICD-10-CM | POA: Insufficient documentation

## 2018-01-30 DIAGNOSIS — D122 Benign neoplasm of ascending colon: Secondary | ICD-10-CM | POA: Diagnosis not present

## 2018-01-30 MED ORDER — MUPIROCIN 2 % EX OINT: TOPICAL_OINTMENT | CUTANEOUS | 0 refills | Status: DC

## 2018-01-30 MED ORDER — DOXYCYCLINE HYCLATE 100 MG PO CAPS: 100.0000 mg | ORAL_CAPSULE | Freq: Two times a day (BID) | ORAL | 0 refills | Status: DC

## 2018-01-30 NOTE — ED Provider Notes (Signed)
[facility] ____________________________________________  Time seen: Approximately 4:26 PM  I have reviewed the triage vital signs and the nursing notes.   HISTORY  Chief Complaint Pressure Behind the Eyes and Recurrent Skin Infections  HPI Alan Trevino is a 57 y.o. male presenting for evaluation of multiple medical complaints.  Patient first reports the last 3 to 4 weeks he has been having intermittent bilateral forehead headaches and sinus pain.  Reports this comes and goes.  Denies known trigger.  States it varies daytime and nighttime.  The pain at times is 10 out of 10. Does not normally have headaches. Reports when pain comes on he tries to sit and relax and rub his temples, and within a few minutes the pain will fully resolve. States the pain does not last more than a few minutes.  States he does not need to take medication for the pain to resolve.  Patient reports this is been his main concern, and reports it sometimes occurs 3-4 times a day.  Denies any accompanying cough, congestion, sore throat, fever, nasal drainage.  Denies history of seasonal allergies.  However he also reports occasionally when he has the forehead sinus pain he will have accompanying shortness of breath.  Denies current headache or pain.    Further describes the shortness of breath in lower neck upper shortness of breath and winded feeling. This sometimes come with the forehead pain and happens in absence of the forehead pain.  Sometimes notes that this at exertion as well as rest.  States he has not been able to reproduce this with exertion necessarily.  Does not change with flat or sitting up. No accompanying chest pain or palpitations.  Denies any accompanying vision changes, dizziness, loss of sensation, unilateral weakness, syncope or near syncope.  Denies any symptoms that last longer than a few minutes. No difficulty swallowing or sensation of airway swelling.  No known trigger, aggravating  or alleviating factors.  States also resolves after few minutes.  Denies history of similar in the past.Denies current shortness of breath.   Also reports for the last 3 days he has had an area to his left forearm of skin changes.  States he initially noticed a small bump that had a whitehead on it, and states the last 3 days the area has quickly became larger and tender.  States tenderness is present with direct touch and as well as if he bumps into it.  States that it looks like it would drain but it has not yet had any drainage.  Denies known insect bite.  No fevers.  No alleviating measures attempted.  Denies decreased arm movement. Denies trauma. Reports tetanus immunization is up to date.    Denies recent sickness. Denies recent antibiotic use.  Does report one recent medication change, stating his BuSpar had been 15 mg, but was increased to 30 but states this was after these complaints and unsure if related. Denies dysuria, abdominal pain, rash, chest pain, or other complaints.    Past Medical History:  Diagnosis Date  . Depression   . H/O ulcer disease   . Hypertension   . SBO (small bowel obstruction) ([place])   . Wears dentures    full upper  hiatal hernia  Patient Active Problem List   Diagnosis Date Noted  . Acute peptic ulcer without hemorrhage and perforation   . Hiatal hernia   . Esophagogastric ulcer   . Stricture and stenosis of esophagus   . GIB (gastrointestinal bleeding) 11/30/2016  .  Hematemesis without nausea   . Acute esophagogastric ulcer   . Upper GI bleed 11/28/2016  . Acute recurrent maxillary sinusitis 09/07/2016  . Special screening for malignant neoplasms, colon   . Benign neoplasm of ascending colon   . Benign neoplasm of transverse colon   . Benign neoplasm of descending colon   . Rectal polyp   . Lumbar disc disease 11/03/2015  . Iron deficiency anemia 08/25/2014  . Acute gastrointestinal bleeding 08/25/2014  . Acute back pain with sciatica 08/25/2014   . Routine general medical examination at a health care facility 08/25/2014  . 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 Essential (primary) hypertension 08/25/2014  . HLD (hyperlipidemia) 08/25/2014    Past Surgical History:  Procedure Laterality Date  . COLONOSCOPY WITH PROPOFOL N/A 12/29/2015   Procedure: COLONOSCOPY WITH PROPOFOL;  Surgeon: [name], MD;  Location: [place];  Service: Endoscopy;  Laterality: N/A;  . ESOPHAGEAL DILATION     stretching  . ESOPHAGEAL DILATION  01/17/2017   Procedure: ESOPHAGEAL DILATION;  Surgeon: [name], MD;  Location: [place];  Service: Gastroenterology;;  . ESOPHAGOGASTRODUODENOSCOPY (EGD) WITH PROPOFOL N/A 11/29/2016   Procedure: ESOPHAGOGASTRODUODENOSCOPY (EGD) WITH PROPOFOL;  Surgeon: [name], MD;  Location: [facility]COPY;  Service: Endoscopy;  Laterality: N/A;  . ESOPHAGOGASTRODUODENOSCOPY (EGD) WITH PROPOFOL N/A 01/17/2017   Procedure: ESOPHAGOGASTRODUODENOSCOPY (EGD) WITH PROPOFOL;  Surgeon: [name], MD;  Location: [place];  Service: Gastroenterology;  Laterality: N/A;  . HERNIA REPAIR    . POLYPECTOMY N/A 12/29/2015   Procedure: POLYPECTOMY;  Surgeon: [name], MD;  Location: [place];  Service: Endoscopy;  Laterality: N/A;  . POLYPECTOMY  01/17/2017   Procedure: POLYPECTOMY;  Surgeon: [name], MD;  Location: [place];  Service: Gastroenterology;;  . SHOULDER SURGERY  09/2009     No current facility-administered medications for this encounter.   Current Outpatient Medications:  .  acetaminophen (TYLENOL) 500 MG tablet, Take 500 mg by mouth every 6 (six) hours as needed., Disp: , Rfl:  .  amLODipine-benazepril (LOTREL) 10-20 MG capsule, TAKE 1 CAPSULE BY MOUTH EVERY DAY, Disp: 90 capsule, Rfl: 1 .  buPROPion (WELLBUTRIN XL) 300 MG 24 hr tablet, Take 1 tablet by mouth daily. Dr Kasandra Knudsen, Disp: ,  Rfl:  .  busPIRone (BUSPAR) 15 MG tablet, Take 1 tablet by mouth 2 (two) times daily. Dr Kasandra Knudsen, Disp: , Rfl:  .  cyclobenzaprine (FLEXERIL) 5 MG tablet, Take 2 tablets (10 mg total) by mouth at bedtime., Disp: 180 tablet, Rfl: 1 .  methylphenidate (RITALIN) 20 MG tablet, Take 1 tablet by mouth 2 (two) times daily. Dr Kasandra Knudsen, Disp: , Rfl:  .  doxycycline (VIBRAMYCIN) 100 MG capsule, Take 1 capsule (100 mg total) by mouth 2 (two) times daily., Disp: 20 capsule, Rfl: 0 .  mupirocin ointment (BACTROBAN) 2 %, Apply two times a day for 7 days., Disp: 22 g, Rfl: 0  Allergies Patient has no known allergies.  Family History  Problem Relation Age of Onset  . Ovarian cancer Mother   . CAD Father   Father: MI Mother: lung cancer  Social History Social History   Tobacco Use  . Smoking status: Former Smoker    Last attempt to quit: 2000    Years since quitting: 19.7  . Smokeless tobacco: Current User    Types: Chew  Substance Use Topics  . Alcohol use: Yes    Alcohol/week: 12.0 standard drinks  Types: 12 Cans of beer per week  . Drug use: No    Review of Systems Constitutional: No fever/chills Eyes: No visual changes. ENT: No sore throat. Cardiovascular: Denies chest pain. Respiratory: Positive shortness of breath. Gastrointestinal: No abdominal pain.  No nausea, no vomiting.  No diarrhea.  No constipation. Genitourinary: Negative for dysuria. Musculoskeletal: Negative for back pain. Skin: Negative for rash. Neurological: Negative for focal weakness or numbness.  Positive headaches, no current headache.  ____________________________________________   PHYSICAL EXAM:  VITAL SIGNS: ED Triage Vitals  Enc Vitals Group     BP 01/30/18 1502 140/80     Pulse Rate 01/30/18 1502 77     Resp 01/30/18 1502 18     Temp 01/30/18 1502 98.8 F (37.1 C)     Temp Source 01/30/18 1502 Oral     SpO2 01/30/18 1502 97 %     Weight 01/30/18 1501 180 lb (81.6 kg)     Height 01/30/18 1501 5\' 3"  (1.6  m)     Head Circumference --      Peak Flow --      Pain Score 01/30/18 1501 0     Pain Loc --      Pain Edu? --      Excl. in [place]? --    Constitutional: Alert and oriented. Well appearing and in no acute distress. Eyes: Conjunctivae are normal. PERRL. EOMI. Head: Atraumatic.  No sinus tenderness palpation.  No facial rash.  No swelling. No erythema.   Ears: no erythema, normal TMs bilaterally.  No mastoid tenderness bilaterally.  Nose: No nasal congestion    Mouth/Throat: Mucous membranes are moist. Oropharynx non-erythematous.No tonsillar swelling or exudate.  Neck: No stridor.  No cervical spine tenderness to palpation. Hematological/Lymphatic/Immunilogical: No cervical lymphadenopathy. Cardiovascular: Normal rate, regular rhythm. Grossly normal heart sounds.  Good peripheral circulation. Respiratory: Normal respiratory effort.  No retractions. No wheezes, rales or rhonchi. Good air movement.  Musculoskeletal: No cervical, thoracic or lumbar tenderness to palpation.  No lower extremity edema noted bilaterally.  Steady gait. Neurologic:  Normal speech and language. No gross focal neurologic deficits are appreciated. No gait instability.5/5 strength to bilateral upper and lower extremities.  Negative pronator drift.  Negative Romberg. Skin:  Skin is warm, dry.  Except: Left dorsal forearm approximately 1.5 cm in diameter erythematous raised pointing area with mild surrounding erythema with tenderness to direct palpation, no other tenderness or skin changes noted.  No active drainage. Psychiatric: Mood and affect are normal. Speech and behavior are normal.  ___________________________________________   LABS (all labs ordered are listed, but only abnormal results are displayed)  Labs Reviewed - No data to display ____________________________________________  EKG  ED ECG REPORT I, Marylene Land, the attending provider and Dr Lacinda Axon, personally viewed and interpreted this ECG.   Date:  01/30/2018  EKG Time: 1549  Rate: 65  Rhythm:normal sinus rhythm   Axis: normal  Intervals:right bundle branch block  ST&T Change: none noted Similar to previous ecg 11/28/2016   RADIOLOGY  Dg Chest 2 View  Result Date: 01/30/2018 CLINICAL DATA:  Intermittent short of breath EXAM: CHEST - 2 VIEW COMPARISON:  11/28/2016 FINDINGS: Heart size and vascularity normal. Lungs are clear without infiltrate or effusion. Moderately large hiatal hernia with air-fluid level unchanged. IMPRESSION: No active cardiopulmonary disease Moderately large hiatal hernia Electronically Signed   By: Franchot Gallo M.D.   On: 01/30/2018 16:39   Ct Head Wo Contrast  Result Date: 01/30/2018 CLINICAL DATA:  Frontal headaches  with pressure and shortness of breath EXAM: CT HEAD WITHOUT CONTRAST TECHNIQUE: Contiguous axial images were obtained from the base of the skull through the vertex without intravenous contrast. COMPARISON:  02/03/2008 FINDINGS: Brain: No evidence of acute infarction, hemorrhage, extra-axial collection, ventriculomegaly, or mass effect. Generalized cerebral atrophy. Vascular: Cerebrovascular atherosclerotic calcifications are noted. Skull: Negative for fracture or focal lesion. Sinuses/Orbits: Visualized portions of the orbits are unremarkable. Visualized portions of the paranasal sinuses and mastoid air cells are unremarkable. Other: None. IMPRESSION: No acute intracranial pathology. Electronically Signed   By: Kathreen Devoid   On: 01/30/2018 16:43   ____________________________________________   PROCEDURES Procedures   Procedure(s) performed:  Procedure(s) performed:  Procedure explained and verbal consent obtained. Consent: Verbal consent obtained. Written consent not obtained. Risks and benefits: risks, benefits and alternatives were discussed Patient identity confirmed: verbally with patient and hospital-assigned identification number  Consent given by: patient   I&D abscess Location:  left forearm Preparation: Patient was prepped and draped in the usual sterile fashion. Anesthesia with 1% Lidocaine 3 mls Irrigation solution: saline and betadine Amount of cleaning: copious Incision made with #11 blade scalpel Moderate purulent drainage immediately obtained with expression. Sterile forceps used to probe and break up loculations.  Patient tolerate well. Wound well approximated post repair.  dressing applied.  Wound care instructions provided.  Observe for any signs of infection or other problems.     INITIAL IMPRESSION / ASSESSMENT AND PLAN / ED COURSE  Pertinent labs & imaging results that were available during my care of the patient were reviewed by me and considered in my medical decision making (see chart for details).  Discussed patient and plan of care with Dr. Lacinda Axon who agrees with plan.  Well-appearing patient.  No acute distress.  Presenting for multiple medical complaints.  3 days of left forearm skin changes, small abscess noted, I&D completed as above, patient tolerated well, discussed supportive care and monitoring.  Will treat with oral doxycycline and topical Bactroban.  Also other complaints of forehead pain as well as intermittent shortness of breath, discussed multiple differential etiologies with patient including medication side effect, stress, anxiety, angina, sinusitis.  Evaluate CT head and chest xray.   Chest x-ray as above per radiologist, negative for acute, hiatal hernia noted.  CT head per radiologist no acute intracranial abnormality as above.  Called patient's primary office and obtained appointment for patient for follow-up at 1:20pm this coming Monday.  Patient agreed to this.  Discussed with patient recommend for further close follow-up and monitoring, discussing with his psychiatrist due to potential medication side effect as well as further evaluation with cardiology. Discussed results, indication, risks and benefits of medications with  patient.  Discussed follow up and return parameters including no resolution or any worsening concerns. Patient verbalized understanding and agreed to plan.   ____________________________________________   FINAL CLINICAL IMPRESSION(S) / ED DIAGNOSES  Final diagnoses:  Acute nonintractable headache, unspecified headache type  Dyspnea, unspecified type  Abscess of arm, left     ED Discharge Orders         Ordered    doxycycline (VIBRAMYCIN) 100 MG capsule  2 times daily     01/30/18 1650    mupirocin ointment (BACTROBAN) 2 %     01/30/18 1650           Note: This dictation was prepared with Dragon dictation along with smaller phrase technology. Any transcriptional errors that result from this process are unintentional.  Marylene Land, NP 01/30/18 1714

## 2018-01-30 NOTE — Discharge Instructions (Addendum)
Take medication as prescribed.  Keep area of skin clean and dry.  Monitor closely.  Follow-up with your primary care this coming Monday as scheduled at 1:20 PM.  This is important.  Return to Urgent care or emergency room for new or worsening concerns.

## 2018-01-30 NOTE — ED Triage Notes (Signed)
Called patient's insurance co @ [phone] for authorization of CT head/brain without contrast CPT [phone]. Spoke with [place], MCKESSON # [id number] valid 01/30/18 to 03/31/18.

## 2018-01-30 NOTE — ED Triage Notes (Signed)
Patient complains of sinus pain and pressure that has been ongoing for last few weeks.   Patient states that he also has a boil on left forearm for the last few days.

## 2018-02-02 ENCOUNTER — Ambulatory Visit (INDEPENDENT_AMBULATORY_CARE_PROVIDER_SITE_OTHER): Payer: BLUE CROSS/BLUE SHIELD | Admitting: Family Medicine

## 2018-02-02 ENCOUNTER — Encounter: Payer: Self-pay | Admitting: Family Medicine

## 2018-02-02 VITALS — BP 138/72 | HR 64 | Ht 63.0 in | Wt 176.0 lb

## 2018-02-02 DIAGNOSIS — G4484 Primary exertional headache: Secondary | ICD-10-CM

## 2018-02-02 DIAGNOSIS — R0789 Other chest pain: Secondary | ICD-10-CM

## 2018-02-02 NOTE — Progress Notes (Signed)
Date:  02/02/2018   Name:  Alan Trevino   DOB:  15-Jun-1960   MRN:  [id number]   Chief Complaint: Headache (has headaches in frontal lobe that "leave as quick as they come on"- urgent care did CT of head- normal) Headache   This is a new problem. The current episode started 1 to 4 weeks ago (3 weeks). The problem occurs daily (more towards evening and at night). The problem has been gradually improving (in intensity and duation). The pain is located in the bilateral and frontal region. The pain does not radiate. The quality of the pain is described as throbbing. The pain is at a severity of 9/10. The pain is moderate. Associated symptoms include nausea, neck pain, rhinorrhea and sinus pressure. Pertinent negatives include no abdominal pain, back pain, blurred vision, coughing, dizziness, drainage, ear pain, eye pain, eye watering, facial sweating, fever, hearing loss, loss of balance, numbness, phonophobia, photophobia, scalp tenderness, sore throat, tingling, tinnitus, visual change or vomiting. The symptoms are aggravated by activity (laying down at night). He has tried acetaminophen for the symptoms. The treatment provided moderate relief. There is no history of cancer, cluster headaches, hypertension, immunosuppression, migraine headaches, migraines in the family, obesity, recent head traumas, sinus disease or TMJ.  Chest Pain   This is a new (anterior neck pain) problem. The current episode started 1 to 4 weeks ago. The problem occurs daily. The problem has been gradually improving. The pain is present in the substernal region (superior). Associated symptoms include headaches and nausea. Pertinent negatives include no abdominal pain, back pain, cough, dizziness, fever, numbness, palpitations, shortness of breath or vomiting.  Pertinent negatives for past medical history include no cancer and no hypertension.     Review of Systems  Constitutional: Negative for chills and fever.  HENT:  Positive for rhinorrhea and sinus pressure. Negative for drooling, ear discharge, ear pain, hearing loss, sore throat and tinnitus.   Eyes: Negative for blurred vision, photophobia and pain.  Respiratory: Negative for cough, shortness of breath and wheezing.   Cardiovascular: Negative for chest pain, palpitations and leg swelling.  Gastrointestinal: Positive for nausea. Negative for abdominal pain, blood in stool, constipation, diarrhea and vomiting.  Endocrine: Negative for polydipsia.  Genitourinary: Negative for dysuria, frequency, hematuria and urgency.  Musculoskeletal: Positive for neck pain. Negative for back pain and myalgias.  Skin: Negative for rash.  Allergic/Immunologic: Negative for environmental allergies.  Neurological: Positive for headaches. Negative for dizziness, tingling, numbness and loss of balance.  Hematological: Does not bruise/bleed easily.  Psychiatric/Behavioral: Negative for suicidal ideas. The patient is not nervous/anxious.     Patient Active Problem List   Diagnosis Date Noted  . Acute peptic ulcer without hemorrhage and perforation   . Hiatal hernia   . Esophagogastric ulcer   . Stricture and stenosis of esophagus   . GIB (gastrointestinal bleeding) 11/30/2016  . Hematemesis without nausea   . Acute esophagogastric ulcer   . Upper GI bleed 11/28/2016  . Acute recurrent maxillary sinusitis 09/07/2016  . Special screening for malignant neoplasms, colon   . Benign neoplasm of ascending colon   . Benign neoplasm of transverse colon   . Benign neoplasm of descending colon   . Rectal polyp   . Lumbar disc disease 11/03/2015  . Iron deficiency anemia 08/25/2014  . Acute gastrointestinal bleeding 08/25/2014  . Acute back pain with sciatica 08/25/2014  . Routine general medical examination at a health care facility 08/25/2014  . DDD (degenerative disc  disease), lumbosacral 08/25/2014  . Recurrent major depressive episodes ([place]) 08/25/2014  . Essential  (primary) hypertension 08/25/2014  . HLD (hyperlipidemia) 08/25/2014    No Known Allergies  Past Surgical History:  Procedure Laterality Date  . COLONOSCOPY WITH PROPOFOL N/A 12/29/2015   Procedure: COLONOSCOPY WITH PROPOFOL;  Surgeon: [name], MD;  Location: [place];  Service: Endoscopy;  Laterality: N/A;  . ESOPHAGEAL DILATION     stretching  . ESOPHAGEAL DILATION  01/17/2017   Procedure: ESOPHAGEAL DILATION;  Surgeon: [name], MD;  Location: [place];  Service: Gastroenterology;;  . ESOPHAGOGASTRODUODENOSCOPY (EGD) WITH PROPOFOL N/A 11/29/2016   Procedure: ESOPHAGOGASTRODUODENOSCOPY (EGD) WITH PROPOFOL;  Surgeon: [name], MD;  Location: [facility] ENDOSCOPY;  Service: Endoscopy;  Laterality: N/A;  . ESOPHAGOGASTRODUODENOSCOPY (EGD) WITH PROPOFOL N/A 01/17/2017   Procedure: ESOPHAGOGASTRODUODENOSCOPY (EGD) WITH PROPOFOL;  Surgeon: [name], MD;  Location: [place];  Service: Gastroenterology;  Laterality: N/A;  . HERNIA REPAIR    . POLYPECTOMY N/A 12/29/2015   Procedure: POLYPECTOMY;  Surgeon: [name], MD;  Location: [place];  Service: Endoscopy;  Laterality: N/A;  . POLYPECTOMY  01/17/2017   Procedure: POLYPECTOMY;  Surgeon: [name], MD;  Location: [place];  Service: Gastroenterology;;  . Langston Masker SURGERY  09/2009    Social History   Tobacco Use  . Smoking status: Former Smoker    Last attempt to quit: 2000    Years since quitting: 19.7  . Smokeless tobacco: Current User    Types: Chew  Substance Use Topics  . Alcohol use: Yes    Alcohol/week: 12.0 standard drinks    Types: 12 Cans of beer per week  . Drug use: No     Medication list has been reviewed and updated.  Current Meds  Medication Sig  . acetaminophen (TYLENOL) 500 MG tablet Take 500 mg by mouth every 6 (six) hours as needed.  Marland Kitchen amLODipine-benazepril (LOTREL) 10-20 MG capsule TAKE 1 CAPSULE BY MOUTH EVERY DAY  . buPROPion (WELLBUTRIN XL)  300 MG 24 hr tablet Take 1 tablet by mouth daily. Dr Kasandra Knudsen  . busPIRone (BUSPAR) 15 MG tablet Take 1 tablet by mouth 2 (two) times daily. Dr Kasandra Knudsen  . cyclobenzaprine (FLEXERIL) 5 MG tablet Take 2 tablets (10 mg total) by mouth at bedtime.  Marland Kitchen doxycycline (VIBRAMYCIN) 100 MG capsule Take 1 capsule (100 mg total) by mouth 2 (two) times daily.  . methylphenidate (RITALIN) 20 MG tablet Take 1 tablet by mouth 2 (two) times daily. Dr Kasandra Knudsen  . mupirocin ointment (BACTROBAN) 2 % Apply two times a day for 7 days.    PHQ 2/9 Scores 01/02/2018 03/28/2017 11/03/2015  PHQ - 2 Score 0 0 0  PHQ- 9 Score 0 2 -    Physical Exam  Constitutional: He is oriented to person, place, and time.  HENT:  Head: Normocephalic.  Right Ear: External ear normal.  Left Ear: External ear normal.  Nose: Nose normal.  Mouth/Throat: Oropharynx is clear and moist.  Eyes: Pupils are equal, round, and reactive to light. Conjunctivae and EOM are normal. Right eye exhibits no discharge. Left eye exhibits no discharge. No scleral icterus.  Neck: Normal range of motion. Neck supple. No JVD present. No tracheal deviation present. No thyromegaly present.  Cardiovascular: Normal rate, regular rhythm, normal heart sounds and intact distal pulses. Exam reveals no gallop and no friction rub.  No murmur heard. Pulmonary/Chest: Breath sounds normal. No respiratory distress. He has no wheezes. He has no rales.  Abdominal: Soft. Bowel sounds are normal. He exhibits no mass. There is no hepatosplenomegaly. There is no tenderness. There is no rebound, no guarding and no CVA tenderness.  Musculoskeletal: Normal range of motion. He exhibits no edema or tenderness.  Lymphadenopathy:    He has no cervical adenopathy.  Neurological: He is alert and oriented to person, place, and time. He has normal strength and normal reflexes. No cranial nerve deficit or sensory deficit.  Reflex Scores:      Tricep reflexes are 2+ on the right side and 2+ on the left  side.      Bicep reflexes are 2+ on the right side and 2+ on the left side.      Brachioradialis reflexes are 2+ on the right side and 2+ on the left side.      Patellar reflexes are 2+ on the right side and 2+ on the left side.      Achilles reflexes are 2+ on the right side and 2+ on the left side. Skin: Skin is warm. No rash noted.  Nursing note and vitals reviewed.   BP 138/72   Pulse 64   Ht 5\' 3"  (1.6 m)   Wt 176 lb (79.8 kg)   BMI 31.18 kg/m   Assessment and Plan:  1. Primary exertional headache sched appt for 02/03/18 @ 11:00 with Chipper Herb - Ambulatory referral to Neurology  2. Atypical chest pain No driving until evaluation by neurology tomorrow/ gave a note for work for tomorrow   Dr. Otilio [facility]  02/02/2018

## 2018-02-04 ENCOUNTER — Other Ambulatory Visit: Payer: Self-pay | Admitting: Acute Care

## 2018-02-04 DIAGNOSIS — G4484 Primary exertional headache: Secondary | ICD-10-CM

## 2018-02-13 ENCOUNTER — Ambulatory Visit
Admission: RE | Admit: 2018-02-13 | Discharge: 2018-02-13 | Disposition: A | Discharge status: Home / Self Care | Payer: BLUE CROSS/BLUE SHIELD | Source: Ambulatory Visit | Attending: Acute Care | Admitting: Acute Care

## 2018-02-13 DIAGNOSIS — G4484 Primary exertional headache: Secondary | ICD-10-CM | POA: Insufficient documentation

## 2018-02-13 DIAGNOSIS — I6523 Occlusion and stenosis of bilateral carotid arteries: Secondary | ICD-10-CM | POA: Insufficient documentation

## 2018-02-13 MED ORDER — IOPAMIDOL (ISOVUE-370) INJECTION 76%
75.0000 mL | Freq: Once | INTRAVENOUS | Status: AC | PRN
  Administered 2018-02-13: 150 mL via INTRAVENOUS

## 2018-08-10 ENCOUNTER — Ambulatory Visit (INDEPENDENT_AMBULATORY_CARE_PROVIDER_SITE_OTHER): Payer: BLUE CROSS/BLUE SHIELD | Admitting: Family Medicine

## 2018-08-10 ENCOUNTER — Encounter: Payer: Self-pay | Admitting: Family Medicine

## 2018-08-10 ENCOUNTER — Other Ambulatory Visit: Payer: Self-pay

## 2018-08-10 VITALS — BP 138/80 | HR 68 | Ht 63.0 in | Wt 172.0 lb

## 2018-08-10 DIAGNOSIS — M519 Unspecified thoracic, thoracolumbar and lumbosacral intervertebral disc disorder: Secondary | ICD-10-CM

## 2018-08-10 DIAGNOSIS — I10 Essential (primary) hypertension: Secondary | ICD-10-CM | POA: Diagnosis not present

## 2018-08-10 MED ORDER — AMLODIPINE BESY-BENAZEPRIL HCL 10-20 MG PO CAPS: ORAL_CAPSULE | ORAL | 1 refills | Status: DC

## 2018-08-10 MED ORDER — CYCLOBENZAPRINE HCL 5 MG PO TABS: 10.0000 mg | ORAL_TABLET | Freq: Every day | ORAL | 1 refills | Status: DC

## 2018-08-10 NOTE — Progress Notes (Signed)
Date:  08/10/2018   Name:  Alan Trevino   DOB:  [birth year]   MRN:  [id number]   Chief Complaint: Hypertension and Spasms (only takes at night)  Hypertension  This is a chronic problem. The current episode started more than 1 year ago. The problem has been gradually improving since onset. The problem is controlled. Pertinent negatives include no anxiety, blurred vision, chest pain, headaches, malaise/fatigue, neck pain, orthopnea, palpitations, peripheral edema, PND, shortness of breath or sweats. There are no associated agents to hypertension. Risk factors for coronary artery disease include dyslipidemia and obesity. Past treatments include ACE inhibitors and calcium channel blockers. The current treatment provides moderate improvement. There are no compliance problems.  There is no history of angina, kidney disease, CAD/MI, CVA, heart failure, left ventricular hypertrophy, PVD or retinopathy. There is no history of chronic renal disease, a hypertension causing med or renovascular disease.  Back Pain  This is a chronic problem. The current episode started more than 1 year ago. The problem occurs intermittently. The problem has been waxing and waning since onset. The pain is present in the lumbar spine. The quality of the pain is described as aching. The pain is moderate. Pertinent negatives include no abdominal pain, bladder incontinence, bowel incontinence, chest pain, dysuria, fever, headaches, leg pain, numbness, paresis, paresthesias, pelvic pain, perianal numbness, tingling, weakness or weight loss. He has tried muscle relaxant for the symptoms.    Review of Systems  Constitutional: Negative for chills, fever, malaise/fatigue and weight loss.  HENT: Negative for drooling, ear discharge, ear pain, nosebleeds, postnasal drip, rhinorrhea, sinus pain and sore throat.   Eyes: Negative for blurred vision.  Respiratory: Negative for cough, shortness of breath and wheezing.    Cardiovascular: Negative for chest pain, palpitations, orthopnea, leg swelling and PND.  Gastrointestinal: Negative for abdominal pain, blood in stool, bowel incontinence, constipation, diarrhea and nausea.  Endocrine: Negative for polydipsia.  Genitourinary: Negative for bladder incontinence, dysuria, frequency, hematuria, pelvic pain and urgency.  Musculoskeletal: Positive for back pain. Negative for myalgias and neck pain.  Skin: Negative for rash.  Allergic/Immunologic: Negative for environmental allergies.  Neurological: Negative for dizziness, tingling, weakness, numbness, headaches and paresthesias.  Hematological: Does not bruise/bleed easily.  Psychiatric/Behavioral: Negative for suicidal ideas. The patient is not nervous/anxious.     Patient Active Problem List   Diagnosis Date Noted  . Acute peptic ulcer without hemorrhage and perforation   . Hiatal hernia   . Esophagogastric ulcer   . Stricture and stenosis of esophagus   . GIB (gastrointestinal bleeding) 11/30/2016  . Hematemesis without nausea   . Acute esophagogastric ulcer   . Upper GI bleed 11/28/2016  . Acute recurrent maxillary sinusitis 09/07/2016  . Special screening for malignant neoplasms, colon   . Benign neoplasm of ascending colon   . Benign neoplasm of transverse colon   . Benign neoplasm of descending colon   . Rectal polyp   . Lumbar disc disease 11/03/2015  . Iron deficiency anemia 08/25/2014  . Acute gastrointestinal bleeding 08/25/2014  . Acute back pain with sciatica 08/25/2014  . Routine general medical examination at a health care facility 08/25/2014  . 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 Recurrent major depressive episodes ([place]) 08/25/2014  . Essential (primary) hypertension 08/25/2014  . HLD (hyperlipidemia) 08/25/2014    No Known Allergies  Past Surgical History:  Procedure Laterality Date  . COLONOSCOPY WITH PROPOFOL N/A 12/29/2015   Procedure: COLONOSCOPY WITH  PROPOFOL;  Surgeon: Evangeline Gula  Allen Norris, MD;  Location: [place];  Service: Endoscopy;  Laterality: N/A;  . ESOPHAGEAL DILATION     stretching  . ESOPHAGEAL DILATION  01/17/2017   Procedure: ESOPHAGEAL DILATION;  Surgeon: [name], MD;  Location: [place];  Service: Gastroenterology;;  . ESOPHAGOGASTRODUODENOSCOPY (EGD) WITH PROPOFOL N/A 11/29/2016   Procedure: ESOPHAGOGASTRODUODENOSCOPY (EGD) WITH PROPOFOL;  Surgeon: [name], MD;  Location: [facility] ENDOSCOPY;  Service: Endoscopy;  Laterality: N/A;  . ESOPHAGOGASTRODUODENOSCOPY (EGD) WITH PROPOFOL N/A 01/17/2017   Procedure: ESOPHAGOGASTRODUODENOSCOPY (EGD) WITH PROPOFOL;  Surgeon: [name], MD;  Location: [place];  Service: Gastroenterology;  Laterality: N/A;  . HERNIA REPAIR    . POLYPECTOMY N/A 12/29/2015   Procedure: POLYPECTOMY;  Surgeon: [name], MD;  Location: [place];  Service: Endoscopy;  Laterality: N/A;  . POLYPECTOMY  01/17/2017   Procedure: POLYPECTOMY;  Surgeon: [name], MD;  Location: [place];  Service: Gastroenterology;;  . Langston Masker SURGERY  09/2009    Social History   Tobacco Use  . Smoking status: Former Smoker    Last attempt to quit: 2000    Years since quitting: 20.3  . Smokeless tobacco: Current User    Types: Chew  Substance Use Topics  . Alcohol use: Yes    Alcohol/week: 12.0 standard drinks    Types: 12 Cans of beer per week  . Drug use: No     Medication list has been reviewed and updated.  Current Meds  Medication Sig  . acetaminophen (TYLENOL) 500 MG tablet Take 500 mg by mouth every 6 (six) hours as needed.  Marland Kitchen amLODipine-benazepril (LOTREL) 10-20 MG capsule TAKE 1 CAPSULE BY MOUTH EVERY DAY  . buPROPion (WELLBUTRIN XL) 300 MG 24 hr tablet Take 1 tablet by mouth daily. Dr Kasandra Knudsen  . busPIRone (BUSPAR) 30 MG tablet Take 1 tablet by mouth 2 (two) times daily. Dr Kasandra Knudsen  . cyclobenzaprine (FLEXERIL) 5 MG tablet Take 2 tablets (10 mg total) by mouth  at bedtime.  . methylphenidate (RITALIN) 20 MG tablet Take 1 tablet by mouth 2 (two) times daily. Dr Kasandra Knudsen  . SUMAtriptan (IMITREX) 100 MG tablet Caryl Pina stepp  . venlafaxine (EFFEXOR) 75 MG tablet Take 1 tablet by mouth 2 (two) times daily. Caryl Pina stepp    [facility] 2/9 Scores 08/10/2018 01/02/2018 03/28/2017 11/03/2015  PHQ - 2 Score 0 0 0 0  PHQ- 9 Score 0 0 2 -    BP Readings from Last 3 Encounters:  08/10/18 138/80  02/02/18 138/72  01/30/18 140/80    Physical Exam Vitals signs and nursing note reviewed.  HENT:     Head: Normocephalic.     Right Ear: Tympanic membrane, ear canal and external ear normal.     Left Ear: Tympanic membrane, ear canal and external ear normal.     Nose: Nose normal.  Eyes:     General: No scleral icterus.       Right eye: No discharge.        Left eye: No discharge.     Conjunctiva/sclera: Conjunctivae normal.     Pupils: Pupils are equal, round, and reactive to light.  Neck:     Musculoskeletal: Normal range of motion and neck supple.     Thyroid: No thyromegaly.     Vascular: No JVD.     Trachea: No tracheal deviation.  Cardiovascular:     Rate and Rhythm: Normal rate and regular rhythm.     Heart sounds: Normal heart sounds. No murmur. No friction rub.  No gallop.   Pulmonary:     Effort: No respiratory distress.     Breath sounds: Normal breath sounds. No stridor. No wheezing, rhonchi or rales.  Chest:     Chest wall: No tenderness.  Abdominal:     General: Bowel sounds are normal. There is no distension.     Palpations: Abdomen is soft. There is no mass.     Tenderness: There is no abdominal tenderness. There is no right CVA tenderness, left CVA tenderness, guarding or rebound.     Hernia: No hernia is present.  Musculoskeletal: Normal range of motion.        General: No tenderness.  Lymphadenopathy:     Cervical: No cervical adenopathy.  Skin:    General: Skin is warm.     Coloration: Skin is not jaundiced or pale.     Findings: No  bruising, erythema, lesion or rash.  Neurological:     Mental Status: He is alert and oriented to person, place, and time.     Cranial Nerves: No cranial nerve deficit.     Deep Tendon Reflexes: Reflexes are normal and symmetric.     Wt Readings from Last 3 Encounters:  08/10/18 172 lb (78 kg)  02/02/18 176 lb (79.8 kg)  01/30/18 180 lb (81.6 kg)    BP 138/80   Pulse 68   Ht 5\' 3"  (1.6 m)   Wt 172 lb (78 kg)   BMI 30.47 kg/m   Assessment and Plan:  1. Essential hypertension Chronic.  Controlled.  Continue amlodipine Bensel Prill 10- 20 reviewed last years renal function panel and will hold on repeat in 6 months. - amLODipine-benazepril (LOTREL) 10-20 MG capsule; TAKE 1 CAPSULE BY MOUTH EVERY DAY  Dispense: 90 capsule; Refill: 1  2. Lumbar disc disease And has chronic lower back pain which is manifested by muscle spasm at night.  Patient will continue cyclobenzaprine 10 mg 1 to an occasional 2 at night. - cyclobenzaprine (FLEXERIL) 5 MG tablet; Take 2 tablets (10 mg total) by mouth at bedtime.  Dispense: 180 tablet; Refill: 1

## 2019-02-01 ENCOUNTER — Ambulatory Visit (INDEPENDENT_AMBULATORY_CARE_PROVIDER_SITE_OTHER): Payer: BC Managed Care – PPO | Admitting: Family Medicine

## 2019-02-01 ENCOUNTER — Other Ambulatory Visit: Payer: Self-pay

## 2019-02-01 ENCOUNTER — Encounter: Payer: Self-pay | Admitting: Family Medicine

## 2019-02-01 VITALS — BP 132/78 | HR 68 | Resp 16 | Ht 63.0 in | Wt 184.0 lb

## 2019-02-01 DIAGNOSIS — M519 Unspecified thoracic, thoracolumbar and lumbosacral intervertebral disc disorder: Secondary | ICD-10-CM

## 2019-02-01 DIAGNOSIS — Z23 Encounter for immunization: Secondary | ICD-10-CM | POA: Diagnosis not present

## 2019-02-01 DIAGNOSIS — I10 Essential (primary) hypertension: Secondary | ICD-10-CM

## 2019-02-01 DIAGNOSIS — E785 Hyperlipidemia, unspecified: Secondary | ICD-10-CM | POA: Diagnosis not present

## 2019-02-01 MED ORDER — CYCLOBENZAPRINE HCL 5 MG PO TABS: 10.0000 mg | ORAL_TABLET | Freq: Every day | ORAL | 1 refills | Status: DC

## 2019-02-01 MED ORDER — AMLODIPINE BESY-BENAZEPRIL HCL 10-20 MG PO CAPS: ORAL_CAPSULE | ORAL | 1 refills | Status: DC

## 2019-02-01 NOTE — Progress Notes (Signed)
Date:  02/01/2019   Name:  Alan Trevino   DOB:  [birth year]   MRN:  [id number]   Chief Complaint: Hypertension, Headache, and influenza vacc need  Hypertension This is a chronic problem. The current episode started more than 1 year ago. The problem has been gradually improving since onset. The problem is controlled. Associated symptoms include headaches. Pertinent negatives include no anxiety, blurred vision, chest pain, malaise/fatigue, neck pain, orthopnea, palpitations, peripheral edema, PND, shortness of breath or sweats. There are no associated agents to hypertension. Risk factors for coronary artery disease include dyslipidemia and male gender. Past treatments include ACE inhibitors and calcium channel blockers. The current treatment provides moderate improvement. There are no compliance problems.  There is no history of angina, kidney disease, CAD/MI, CVA, heart failure, left ventricular hypertrophy or PVD. There is no history of chronic renal disease, a hypertension causing med or renovascular disease.    Review of Systems  Constitutional: Negative for chills, fever and malaise/fatigue.  HENT: Negative for drooling, ear discharge, ear pain and sore throat.   Eyes: Negative for blurred vision.  Respiratory: Negative for cough, shortness of breath and wheezing.   Cardiovascular: Negative for chest pain, palpitations, orthopnea, leg swelling and PND.  Gastrointestinal: Negative for abdominal pain, blood in stool, constipation, diarrhea and nausea.  Endocrine: Negative for polydipsia.  Genitourinary: Negative for dysuria, frequency, hematuria and urgency.  Musculoskeletal: Negative for back pain, myalgias and neck pain.  Skin: Negative for rash.  Allergic/Immunologic: Negative for environmental allergies.  Neurological: Positive for headaches. Negative for dizziness.  Hematological: Does not bruise/bleed easily.  Psychiatric/Behavioral: Negative for suicidal ideas. The patient  is not nervous/anxious.     Patient Active Problem List   Diagnosis Date Noted  . Acute peptic ulcer without hemorrhage and perforation   . Hiatal hernia   . Esophagogastric ulcer   . Stricture and stenosis of esophagus   . GIB (gastrointestinal bleeding) 11/30/2016  . Hematemesis without nausea   . Acute esophagogastric ulcer   . Upper GI bleed 11/28/2016  . Acute recurrent maxillary sinusitis 09/07/2016  . Special screening for malignant neoplasms, colon   . Benign neoplasm of ascending colon   . Benign neoplasm of transverse colon   . Benign neoplasm of descending colon   . Rectal polyp   . Lumbar disc disease 11/03/2015  . Iron deficiency anemia 08/25/2014  . Acute gastrointestinal bleeding 08/25/2014  . Acute back pain with sciatica 08/25/2014  . Routine general medical examination at a health care facility 08/25/2014  . 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 Recurrent major depressive episodes ([place]) 08/25/2014  . Essential (primary) hypertension 08/25/2014  . HLD (hyperlipidemia) 08/25/2014    No Known Allergies  Past Surgical History:  Procedure Laterality Date  . COLONOSCOPY WITH PROPOFOL N/A 12/29/2015   Procedure: COLONOSCOPY WITH PROPOFOL;  Surgeon: [name], MD;  Location: [place];  Service: Endoscopy;  Laterality: N/A;  . ESOPHAGEAL DILATION     stretching  . ESOPHAGEAL DILATION  01/17/2017   Procedure: ESOPHAGEAL DILATION;  Surgeon: [name], MD;  Location: [place];  Service: Gastroenterology;;  . ESOPHAGOGASTRODUODENOSCOPY (EGD) WITH PROPOFOL N/A 11/29/2016   Procedure: ESOPHAGOGASTRODUODENOSCOPY (EGD) WITH PROPOFOL;  Surgeon: [name], MD;  [facility] ENDOSCOPY;  Service: Endoscopy;  Laterality: N/A;  . ESOPHAGOGASTRODUODENOSCOPY (EGD) WITH PROPOFOL N/A 01/17/2017   Procedure: ESOPHAGOGASTRODUODENOSCOPY (EGD) WITH PROPOFOL;  Surgeon: [name], MD;  Location: [place];  Service:  Gastroenterology;  Laterality: N/A;  .  HERNIA REPAIR    . POLYPECTOMY N/A 12/29/2015   Procedure: POLYPECTOMY;  Surgeon: [name], MD;  Location: [place];  Service: Endoscopy;  Laterality: N/A;  . POLYPECTOMY  01/17/2017   Procedure: POLYPECTOMY;  Surgeon: [name], MD;  Location: [place];  Service: Gastroenterology;;  . Langston Masker SURGERY  09/2009    Social History   Tobacco Use  . Smoking status: Former Smoker    Quit date: 2000    Years since quitting: 20.7  . Smokeless tobacco: Current User    Types: Chew  Substance Use Topics  . Alcohol use: Yes    Alcohol/week: 12.0 standard drinks    Types: 12 Cans of beer per week  . Drug use: No     Medication list has been reviewed and updated.  Current Meds  Medication Sig  . acetaminophen (TYLENOL) 500 MG tablet Take 500 mg by mouth every 6 (six) hours as needed.  Marland Kitchen amLODipine-benazepril (LOTREL) 10-20 MG capsule TAKE 1 CAPSULE BY MOUTH EVERY DAY  . buPROPion (WELLBUTRIN XL) 300 MG 24 hr tablet Take 1 tablet by mouth daily. Dr Kasandra Knudsen  . busPIRone (BUSPAR) 30 MG tablet Take 1 tablet by mouth 2 (two) times daily. Dr Kasandra Knudsen  . cyclobenzaprine (FLEXERIL) 5 MG tablet Take 2 tablets (10 mg total) by mouth at bedtime.  . methylphenidate (RITALIN) 20 MG tablet Take 1 tablet by mouth 2 (two) times daily. Dr Kasandra Knudsen  . SUMAtriptan (IMITREX) 100 MG tablet Caryl Pina stepp  . venlafaxine (EFFEXOR) 75 MG tablet Take 1 tablet by mouth 2 (two) times daily. Caryl Pina stepp    [facility] 2/9 Scores 02/01/2019 08/10/2018 01/02/2018 03/28/2017  PHQ - 2 Score 0 0 0 0  PHQ- 9 Score 0 0 0 2    BP Readings from Last 3 Encounters:  02/01/19 132/78  08/10/18 138/80  02/02/18 138/72    Physical Exam Vitals signs and nursing note reviewed.  HENT:     Head: Normocephalic.     Right Ear: External ear normal.     Left Ear: External ear normal.     Nose: Nose normal.     Mouth/Throat:     Mouth: Mucous membranes are moist.     Pharynx: Oropharynx  is clear.  Eyes:     General: No scleral icterus.       Right eye: No discharge.        Left eye: No discharge.     Extraocular Movements: Extraocular movements intact.     Right eye: Normal extraocular motion.     Left eye: Normal extraocular motion.     Conjunctiva/sclera: Conjunctivae normal.     Pupils: Pupils are equal, round, and reactive to light.  Neck:     Musculoskeletal: Normal range of motion and neck supple.     Thyroid: No thyromegaly.     Vascular: No JVD.     Trachea: No tracheal deviation.  Cardiovascular:     Rate and Rhythm: Normal rate and regular rhythm.     Heart sounds: Normal heart sounds. No murmur. No friction rub. No gallop.   Pulmonary:     Effort: No respiratory distress.     Breath sounds: Normal breath sounds. No wheezing, rhonchi or rales.  Abdominal:     General: Bowel sounds are normal.     Palpations: Abdomen is soft. There is no mass.     Tenderness: There is no abdominal tenderness. There is no guarding or rebound.  Musculoskeletal: Normal range of motion.  General: No tenderness.  Lymphadenopathy:     Cervical: No cervical adenopathy.  Skin:    General: Skin is warm.     Findings: No rash.  Neurological:     Mental Status: He is alert and oriented to person, place, and time.     Cranial Nerves: No cranial nerve deficit.     Deep Tendon Reflexes: Reflexes are normal and symmetric.     Wt Readings from Last 3 Encounters:  02/01/19 184 lb (83.5 kg)  08/10/18 172 lb (78 kg)  02/02/18 176 lb (79.8 kg)    BP 132/78   Pulse 68   Resp 16   Ht 5\' 3"  (1.6 m)   Wt 184 lb (83.5 kg)   SpO2 98%   BMI 32.59 kg/m   Assessment and Plan: 1. Essential hypertension Chronic.  Controlled.  Stable.  Continue amlodipine-benazepril 10- 20 mg on a daily basis.  Will check renal function panel. - amLODipine-benazepril (LOTREL) 10-20 MG capsule; TAKE 1 CAPSULE BY MOUTH EVERY DAY  Dispense: 90 capsule; Refill: 1 - Renal Function Panel  2.  Lumbar disc disease Patient continues to have some flareup of his disc disease for which we will continue cyclobenzaprine 5 mg 1-2 nightly as needed for back spasms. - cyclobenzaprine (FLEXERIL) 5 MG tablet; Take 2 tablets (10 mg total) by mouth at bedtime.  Dispense: 180 tablet; Refill: 1  3. Dyslipidemia On review of patient's labs he was noted to have an elevated LDL and total cholesterol.  We will check lipid panel to evaluate for dyslipidemia. - Lipid panel  4. Need for immunization against influenza Discussed and administered - Flu Vaccine QUAD 36+ mos IM

## 2019-02-01 NOTE — Patient Instructions (Signed)

## 2019-02-02 ENCOUNTER — Other Ambulatory Visit: Payer: Self-pay

## 2019-02-02 DIAGNOSIS — E782 Mixed hyperlipidemia: Secondary | ICD-10-CM

## 2019-02-02 LAB — LIPID PANEL
Chol/HDL Ratio: 5.6 ratio — ABNORMAL HIGH (ref 0.0–5.0)
Cholesterol, Total: 247 mg/dL — ABNORMAL HIGH (ref 100–199)
HDL: 44 mg/dL (ref >39)
LDL Chol Calc (NIH): 170 mg/dL — ABNORMAL HIGH (ref 0–99)
Triglycerides: 177 mg/dL — ABNORMAL HIGH (ref 0–149)
VLDL Cholesterol Cal: 33 mg/dL (ref 5–40)

## 2019-02-02 LAB — RENAL FUNCTION PANEL
Albumin: 4.4 g/dL (ref 3.8–4.9)
BUN/Creatinine Ratio: 11 (ref 9–20)
BUN: 15 mg/dL (ref 6–24)
CO2: 24 mmol/L (ref 20–29)
Calcium: 9.5 mg/dL (ref 8.7–10.2)
Chloride: 106 mmol/L (ref 96–106)
Creatinine, Ser: 1.31 mg/dL — ABNORMAL HIGH (ref 0.76–1.27)
GFR calc Af Amer: 68 mL/min/{1.73_m2} (ref >59)
GFR calc non Af Amer: 59 mL/min/{1.73_m2} — ABNORMAL LOW (ref >59)
Glucose: 97 mg/dL (ref 65–99)
Phosphorus: 3.1 mg/dL (ref 2.8–4.1)
Potassium: 4.8 mmol/L (ref 3.5–5.2)
Sodium: 142 mmol/L (ref 134–144)

## 2019-02-02 MED ORDER — ATORVASTATIN CALCIUM 10 MG PO TABS: 10.0000 mg | ORAL_TABLET | Freq: Every day | ORAL | 1 refills | Status: DC

## 2019-02-02 NOTE — Progress Notes (Signed)
Sent in Atorv 10mg  to [facility]

## 2019-02-16 ENCOUNTER — Telehealth: Payer: Self-pay

## 2019-02-16 NOTE — Telephone Encounter (Signed)
LVM asking patient to call office back in regards to scheduling his repeat colonoscopy with Dr.Wohl.  Thanks [employer]

## 2019-02-16 NOTE — Telephone Encounter (Signed)
-----   Message from Glennie Isle, [place] sent at 02/01/2019 11:48 AM EDT -----  ----- Message ----- From: Fredderick Severance Sent: 02/01/2019  11:12 AM EDT To: Glennie Isle, CMA  Please call pt and set up 3 year follow up due to Tubular Adenomas. No new problems, just due for the 3 year follow up colonoscopy/ thanks

## 2019-02-28 ENCOUNTER — Other Ambulatory Visit: Payer: Self-pay | Admitting: Family Medicine

## 2019-02-28 DIAGNOSIS — E782 Mixed hyperlipidemia: Secondary | ICD-10-CM

## 2019-03-27 ENCOUNTER — Other Ambulatory Visit: Payer: Self-pay | Admitting: Family Medicine

## 2019-03-27 DIAGNOSIS — E782 Mixed hyperlipidemia: Secondary | ICD-10-CM

## 2019-05-01 ENCOUNTER — Other Ambulatory Visit: Payer: Self-pay | Admitting: Family Medicine

## 2019-05-01 DIAGNOSIS — E782 Mixed hyperlipidemia: Secondary | ICD-10-CM

## 2019-05-28 ENCOUNTER — Other Ambulatory Visit: Payer: Self-pay | Admitting: Family Medicine

## 2019-05-28 DIAGNOSIS — E782 Mixed hyperlipidemia: Secondary | ICD-10-CM

## 2019-06-27 ENCOUNTER — Other Ambulatory Visit: Payer: Self-pay | Admitting: Family Medicine

## 2019-06-27 DIAGNOSIS — E782 Mixed hyperlipidemia: Secondary | ICD-10-CM

## 2019-07-16 ENCOUNTER — Other Ambulatory Visit: Payer: Self-pay | Admitting: Family Medicine

## 2019-07-16 DIAGNOSIS — E782 Mixed hyperlipidemia: Secondary | ICD-10-CM

## 2019-07-25 ENCOUNTER — Other Ambulatory Visit: Payer: Self-pay | Admitting: Family Medicine

## 2019-07-25 DIAGNOSIS — E782 Mixed hyperlipidemia: Secondary | ICD-10-CM

## 2019-08-02 ENCOUNTER — Ambulatory Visit: Payer: BC Managed Care – PPO | Admitting: Family Medicine

## 2019-08-08 ENCOUNTER — Other Ambulatory Visit: Payer: Self-pay | Admitting: Family Medicine

## 2019-08-08 DIAGNOSIS — I10 Essential (primary) hypertension: Secondary | ICD-10-CM

## 2019-08-13 ENCOUNTER — Other Ambulatory Visit: Payer: Self-pay | Admitting: Family Medicine

## 2019-08-13 ENCOUNTER — Encounter: Payer: Self-pay | Admitting: Family Medicine

## 2019-08-13 ENCOUNTER — Other Ambulatory Visit: Payer: Self-pay

## 2019-08-13 ENCOUNTER — Ambulatory Visit (INDEPENDENT_AMBULATORY_CARE_PROVIDER_SITE_OTHER): Payer: BC Managed Care – PPO | Admitting: Family Medicine

## 2019-08-13 VITALS — BP 120/60 | HR 68 | Ht 63.0 in | Wt 186.0 lb

## 2019-08-13 DIAGNOSIS — E782 Mixed hyperlipidemia: Secondary | ICD-10-CM | POA: Diagnosis not present

## 2019-08-13 DIAGNOSIS — I10 Essential (primary) hypertension: Secondary | ICD-10-CM

## 2019-08-13 DIAGNOSIS — M519 Unspecified thoracic, thoracolumbar and lumbosacral intervertebral disc disorder: Secondary | ICD-10-CM

## 2019-08-13 MED ORDER — CYCLOBENZAPRINE HCL 5 MG PO TABS: 10.0000 mg | ORAL_TABLET | Freq: Every day | ORAL | 1 refills | Status: DC

## 2019-08-13 MED ORDER — AMLODIPINE BESY-BENAZEPRIL HCL 10-20 MG PO CAPS: 1.0000 | ORAL_CAPSULE | Freq: Every day | ORAL | 1 refills | Status: DC

## 2019-08-13 MED ORDER — ATORVASTATIN CALCIUM 10 MG PO TABS: ORAL_TABLET | ORAL | 1 refills | Status: DC

## 2019-08-13 NOTE — Progress Notes (Signed)
Date:  08/13/2019   Name:  Alan Trevino   DOB:  17-Mar-1961   MRN:  [id number]   Chief Complaint: Hypertension, Hyperlipidemia, and Back Pain (uses cyclobenzaprine as needed)  Hypertension This is a chronic problem. The current episode started more than 1 year ago. The problem has been gradually improving since onset. The problem is controlled. Pertinent negatives include no anxiety, blurred vision, chest pain, headaches, malaise/fatigue, neck pain, orthopnea, palpitations, peripheral edema, PND, shortness of breath or sweats. There are no associated agents to hypertension. Risk factors for coronary artery disease include dyslipidemia and obesity. Past treatments include ACE inhibitors and calcium channel blockers. The current treatment provides moderate improvement. There are no compliance problems.  There is no history of angina, kidney disease, CAD/MI, CVA, heart failure, left ventricular hypertrophy, PVD or retinopathy. There is no history of chronic renal disease, a hypertension causing med or renovascular disease.  Hyperlipidemia This is a chronic problem. The current episode started more than 1 year ago. The problem is controlled. Recent lipid tests were reviewed and are normal. Exacerbating diseases include obesity. He has no history of chronic renal disease, diabetes, hypothyroidism, liver disease or nephrotic syndrome. Factors aggravating his hyperlipidemia include fatty foods. Pertinent negatives include no chest pain, focal sensory loss, focal weakness, leg pain, myalgias or shortness of breath. Current antihyperlipidemic treatment includes statins. The current treatment provides moderate improvement of lipids. There are no compliance problems.  Risk factors for coronary artery disease include hypertension.  Back Pain This is a chronic problem. The current episode started more than 1 month ago. The problem occurs daily. The problem has been gradually improving since onset. The pain  is present in the lumbar spine. The quality of the pain is described as aching and cramping. The pain does not radiate. The pain is mild. The symptoms are aggravated by bending and twisting (lifting). Pertinent negatives include no abdominal pain, chest pain, dysuria, fever, headaches or leg pain. The treatment provided moderate relief.    Lab Results  Component Value Date   CREATININE 1.31 (H) 02/01/2019   BUN 15 02/01/2019   NA 142 02/01/2019   K 4.8 02/01/2019   CL 106 02/01/2019   CO2 24 02/01/2019   Lab Results  Component Value Date   CHOL 247 (H) 02/01/2019   HDL 44 02/01/2019   LDLCALC 170 (H) 02/01/2019   TRIG 177 (H) 02/01/2019   CHOLHDL 5.6 (H) 02/01/2019   No results found for: TSH No results found for: HGBA1C Lab Results  Component Value Date   WBC 12.4 (H) 11/29/2016   HGB 11.6 (L) 12/09/2016   HCT 33.2 (L) 11/29/2016   MCV 93.8 11/29/2016   PLT 196 11/29/2016   Lab Results  Component Value Date   ALT 22 11/28/2016   AST 27 11/28/2016   ALKPHOS 44 11/28/2016   BILITOT 1.1 11/28/2016     Review of Systems  Constitutional: Negative for chills, fever and malaise/fatigue.  HENT: Negative for drooling, ear discharge, ear pain and sore throat.   Eyes: Negative for blurred vision.  Respiratory: Negative for cough, shortness of breath and wheezing.   Cardiovascular: Negative for chest pain, palpitations, orthopnea, leg swelling and PND.  Gastrointestinal: Negative for abdominal pain, blood in stool, constipation, diarrhea and nausea.  Endocrine: Negative for polydipsia.  Genitourinary: Negative for dysuria, frequency, hematuria and urgency.  Musculoskeletal: Positive for back pain. Negative for myalgias and neck pain.  Skin: Negative for rash.  Allergic/Immunologic: Negative for environmental allergies.  Neurological: Negative for dizziness, focal weakness and headaches.  Hematological: Does not bruise/bleed easily.  Psychiatric/Behavioral: Negative for  suicidal ideas. The patient is not nervous/anxious.     Patient Active Problem List   Diagnosis Date Noted  . Acute peptic ulcer without hemorrhage and perforation   . Hiatal hernia   . Esophagogastric ulcer   . Stricture and stenosis of esophagus   . GIB (gastrointestinal bleeding) 11/30/2016  . Hematemesis without nausea   . Acute esophagogastric ulcer   . Upper GI bleed 11/28/2016  . Acute recurrent maxillary sinusitis 09/07/2016  . Special screening for malignant neoplasms, colon   . Benign neoplasm of ascending colon   . Benign neoplasm of transverse colon   . Benign neoplasm of descending colon   . Rectal polyp   . Lumbar disc disease 11/03/2015  . Iron deficiency anemia 08/25/2014  . Acute gastrointestinal bleeding 08/25/2014  . Acute back pain with sciatica 08/25/2014  . Routine general medical examination at a health care facility 08/25/2014  . 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 Recurrent major depressive episodes ([place]) 08/25/2014  . Essential (primary) hypertension 08/25/2014  . HLD (hyperlipidemia) 08/25/2014    No Known Allergies  Past Surgical History:  Procedure Laterality Date  . COLONOSCOPY WITH PROPOFOL N/A 12/29/2015   Procedure: COLONOSCOPY WITH PROPOFOL;  Surgeon: [name], MD;  Location: [place];  Service: Endoscopy;  Laterality: N/A;  . ESOPHAGEAL DILATION     stretching  . ESOPHAGEAL DILATION  01/17/2017   Procedure: ESOPHAGEAL DILATION;  Surgeon: [name], MD;  Location: [place];  Service: Gastroenterology;;  . ESOPHAGOGASTRODUODENOSCOPY (EGD) WITH PROPOFOL N/A 11/29/2016   Procedure: ESOPHAGOGASTRODUODENOSCOPY (EGD) WITH PROPOFOL;  Surgeon: [name], MD;  Location[facility] END[facility]COPY;  Service: Endoscopy;  Laterality: N/A;  . ESOPHAGOGASTRODUODENOSCOPY (EGD) WITH PROPOFOL N/A 01/17/2017   Procedure: ESOPHAGOGASTRODUODENOSCOPY (EGD) WITH PROPOFOL;  Surgeon: [name], MD;  Location: [place];  Service: Gastroenterology;  Laterality: N/A;  . HERNIA REPAIR    . POLYPECTOMY N/A 12/29/2015   Procedure: POLYPECTOMY;  Surgeon: [name], MD;  Location: [place];  Service: Endoscopy;  Laterality: N/A;  . POLYPECTOMY  01/17/2017   Procedure: POLYPECTOMY;  Surgeon: [name], MD;  Location: [place];  Service: Gastroenterology;;  . Langston Masker SURGERY  09/2009    Social History   Tobacco Use  . Smoking status: Former Smoker    Quit date: 2000    Years since quitting: 21.3  . Smokeless tobacco: Current User    Types: Chew  Substance Use Topics  . Alcohol use: Yes    Alcohol/week: 12.0 standard drinks    Types: 12 Cans of beer per week  . Drug use: No     Medication list has been reviewed and updated.  Current Meds  Medication Sig  . acetaminophen (TYLENOL) 500 MG tablet Take 500 mg by mouth every 6 (six) hours as needed.  Marland Kitchen amLODipine-benazepril (LOTREL) 10-20 MG capsule TAKE 1 CAPSULE BY MOUTH EVERY DAY  . atorvastatin (LIPITOR) 10 MG tablet TAKE 1 TABLET BY MOUTH EVERY DAY *NEEDS OFFICE VISIT  . buPROPion (WELLBUTRIN XL) 300 MG 24 hr tablet Take 1 tablet by mouth daily. Dr Kasandra Knudsen  . busPIRone (BUSPAR) 30 MG tablet Take 1 tablet by mouth 2 (two) times daily. Dr Kasandra Knudsen  . cyclobenzaprine (FLEXERIL) 5 MG tablet Take 2 tablets (10 mg total) by mouth at bedtime.  . methylphenidate (RITALIN) 20 MG tablet Take 1 tablet by mouth 2 (  two) times daily. Dr Kasandra Knudsen  . SUMAtriptan (IMITREX) 100 MG tablet Caryl Pina stepp  . venlafaxine (EFFEXOR) 75 MG tablet Take 1 tablet by mouth 2 (two) times daily. Caryl Pina stepp    [facility] 2/9 Scores 08/13/2019 02/01/2019 08/10/2018 01/02/2018  PHQ - 2 Score 0 0 0 0  PHQ- 9 Score 0 0 0 0    BP Readings from Last 3 Encounters:  08/13/19 120/60  02/01/19 132/78  08/10/18 138/80    Physical Exam Vitals and nursing note reviewed.  HENT:     Head: Normocephalic.     Right Ear: Tympanic membrane, ear canal and external ear normal. There is  no impacted cerumen.     Left Ear: Tympanic membrane, ear canal and external ear normal. There is no impacted cerumen.     Nose: Nose normal.     Mouth/Throat:     Mouth: Mucous membranes are moist.  Eyes:     General: No scleral icterus.       Right eye: No discharge.        Left eye: No discharge.     Conjunctiva/sclera: Conjunctivae normal.     Pupils: Pupils are equal, round, and reactive to light.  Neck:     Thyroid: No thyromegaly.     Vascular: No JVD.     Trachea: No tracheal deviation.  Cardiovascular:     Rate and Rhythm: Normal rate and regular rhythm.     Heart sounds: Normal heart sounds. No murmur. No friction rub. No gallop.   Pulmonary:     Effort: No respiratory distress.     Breath sounds: Normal breath sounds. No wheezing or rales.  Abdominal:     General: Bowel sounds are normal.     Palpations: Abdomen is soft. There is no mass.     Tenderness: There is no abdominal tenderness. There is no guarding or rebound.  Musculoskeletal:        General: No tenderness. Normal range of motion.     Cervical back: Normal range of motion and neck supple.  Lymphadenopathy:     Cervical: No cervical adenopathy.  Skin:    General: Skin is warm.     Findings: No rash.  Neurological:     Mental Status: He is alert and oriented to person, place, and time.     Cranial Nerves: No cranial nerve deficit.     Deep Tendon Reflexes: Reflexes are normal and symmetric.  Psychiatric:        Mood and Affect: Mood normal.     Wt Readings from Last 3 Encounters:  08/13/19 186 lb (84.4 kg)  02/01/19 184 lb (83.5 kg)  08/10/18 172 lb (78 kg)    BP 120/60   Pulse 68   Ht 5\' 3"  (1.6 m)   Wt 186 lb (84.4 kg)   BMI 32.95 kg/m   Assessment and Plan: 1. Essential hypertension Chronic.  Controlled.  Stable.  Continue amlodipine benazepril 10-20 mg once a day.  Reviewed previous renal function is unremarkable patient will be returning for physical exam and will do labs at that  time - amLODipine-benazepril (LOTREL) 10-20 MG capsule; Take 1 capsule by mouth daily.  Dispense: 90 capsule; Refill: 1  2. Lumbar disc disease Chronic.  Controlled.  Stable.  Patient would like to continue occasional muscle relaxant cyclobenzaprine 5 mg 2 tablets as needed. - cyclobenzaprine (FLEXERIL) 5 MG tablet; Take 2 tablets (10 mg total) by mouth at bedtime.  Dispense: 180 tablet; Refill: 1  3.  Mixed hyperlipidemia Chronic.  Controlled.  Stable.  Continue atorvastatin 10 mg once a day.  We will recheck patient in 6 months.  Patient is anticipating a physical exam at which time we will check lab work. - atorvastatin (LIPITOR) 10 MG tablet; TAKE 1 TABLET BY MOUTH EVERY DAY *NEEDS OFFICE VISIT  Dispense: 90 tablet; Refill: 1

## 2019-09-10 ENCOUNTER — Encounter: Payer: Self-pay | Admitting: Family Medicine

## 2019-09-10 ENCOUNTER — Ambulatory Visit (INDEPENDENT_AMBULATORY_CARE_PROVIDER_SITE_OTHER): Payer: BC Managed Care – PPO | Admitting: Family Medicine

## 2019-09-10 ENCOUNTER — Other Ambulatory Visit: Payer: Self-pay

## 2019-09-10 VITALS — BP 120/80 | HR 60 | Ht 63.0 in | Wt 181.0 lb

## 2019-09-10 DIAGNOSIS — Z1211 Encounter for screening for malignant neoplasm of colon: Secondary | ICD-10-CM

## 2019-09-10 DIAGNOSIS — K4091 Unilateral inguinal hernia, without obstruction or gangrene, recurrent: Secondary | ICD-10-CM | POA: Diagnosis not present

## 2019-09-10 DIAGNOSIS — Z0000 Encounter for general adult medical examination without abnormal findings: Secondary | ICD-10-CM | POA: Diagnosis not present

## 2019-09-10 DIAGNOSIS — N400 Enlarged prostate without lower urinary tract symptoms: Secondary | ICD-10-CM | POA: Diagnosis not present

## 2019-09-10 NOTE — Progress Notes (Signed)
Date:  09/10/2019   Name:  Alan Trevino   DOB:  Jun 13, 1960   MRN:  [id number]   Chief Complaint: Annual Exam (biometric screening for work- needs lipid and renal)  Patient is a 59 year old male who presents for a comprehensive physical exam. The patient reports the following problems: none. Health maintenance has been reviewed colonoscopy.   Lab Results  Component Value Date   CREATININE 1.31 (H) 02/01/2019   BUN 15 02/01/2019   NA 142 02/01/2019   K 4.8 02/01/2019   CL 106 02/01/2019   CO2 24 02/01/2019   Lab Results  Component Value Date   CHOL 247 (H) 02/01/2019   HDL 44 02/01/2019   LDLCALC 170 (H) 02/01/2019   TRIG 177 (H) 02/01/2019   CHOLHDL 5.6 (H) 02/01/2019   No results found for: TSH No results found for: HGBA1C Lab Results  Component Value Date   WBC 12.4 (H) 11/29/2016   HGB 11.6 (L) 12/09/2016   HCT 33.2 (L) 11/29/2016   MCV 93.8 11/29/2016   PLT 196 11/29/2016   Lab Results  Component Value Date   ALT 22 11/28/2016   AST 27 11/28/2016   ALKPHOS 44 11/28/2016   BILITOT 1.1 11/28/2016     Review of Systems  Constitutional: Negative for chills and fever.  HENT: Negative for drooling, ear discharge, ear pain and sore throat.   Respiratory: Negative for cough, shortness of breath and wheezing.   Cardiovascular: Negative for chest pain, palpitations and leg swelling.  Gastrointestinal: Negative for abdominal pain, blood in stool, constipation, diarrhea and nausea.  Endocrine: Negative for polydipsia.  Genitourinary: Negative for dysuria, frequency, hematuria and urgency.  Musculoskeletal: Negative for back pain, myalgias and neck pain.  Skin: Negative for rash.  Allergic/Immunologic: Negative for environmental allergies.  Neurological: Negative for dizziness and headaches.  Hematological: Does not bruise/bleed easily.  Psychiatric/Behavioral: Negative for suicidal ideas. The patient is not nervous/anxious.     Patient Active Problem  List   Diagnosis Date Noted  . Acute peptic ulcer without hemorrhage and perforation   . Hiatal hernia   . Esophagogastric ulcer   . Stricture and stenosis of esophagus   . GIB (gastrointestinal bleeding) 11/30/2016  . Hematemesis without nausea   . Acute esophagogastric ulcer   . Upper GI bleed 11/28/2016  . Acute recurrent maxillary sinusitis 09/07/2016  . Special screening for malignant neoplasms, colon   . Benign neoplasm of ascending colon   . Benign neoplasm of transverse colon   . Benign neoplasm of descending colon   . Rectal polyp   . Lumbar disc disease 11/03/2015  . Iron deficiency anemia 08/25/2014  . Acute gastrointestinal bleeding 08/25/2014  . Acute back pain with sciatica 08/25/2014  . Routine general medical examination at a health care facility 08/25/2014  . 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 Recurrent major depressive episodes ([place]) 08/25/2014  . Essential (primary) hypertension 08/25/2014  . HLD (hyperlipidemia) 08/25/2014    No Known Allergies  Past Surgical History:  Procedure Laterality Date  . COLONOSCOPY WITH PROPOFOL N/A 12/29/2015   Procedure: COLONOSCOPY WITH PROPOFOL;  Surgeon: [name], MD;  Location: [place];  Service: Endoscopy;  Laterality: N/A;  . ESOPHAGEAL DILATION     stretching  . ESOPHAGEAL DILATION  01/17/2017   Procedure: ESOPHAGEAL DILATION;  Surgeon: [name], MD;  Location: [place];  Service: Gastroenterology;;  . ESOPHAGOGASTRODUODENOSCOPY (EGD) WITH PROPOFOL N/A 11/29/2016   Procedure: ESOPHAGOGASTRODUODENOSCOPY (EGD) WITH  PROPOFOL;  Surgeon: [name], MD;  Location: [facility] ENDOSCOPY;  Service: Endoscopy;  Laterality: N/A;  . ESOPHAGOGASTRODUODENOSCOPY (EGD) WITH PROPOFOL N/A 01/17/2017   Procedure: ESOPHAGOGASTRODUODENOSCOPY (EGD) WITH PROPOFOL;  Surgeon: [name], MD;  Location: [place];  Service: Gastroenterology;  Laterality: N/A;  . HERNIA REPAIR    .  POLYPECTOMY N/A 12/29/2015   Procedure: POLYPECTOMY;  Surgeon: [name], MD;  Location: [place];  Service: Endoscopy;  Laterality: N/A;  . POLYPECTOMY  01/17/2017   Procedure: POLYPECTOMY;  Surgeon: [name], MD;  Location: [place];  Service: Gastroenterology;;  . Langston Masker SURGERY  09/2009    Social History   Tobacco Use  . Smoking status: Former Smoker    Quit date: 2000    Years since quitting: 21.4  . Smokeless tobacco: Current User    Types: Chew  Substance Use Topics  . Alcohol use: Yes    Alcohol/week: 12.0 standard drinks    Types: 12 Cans of beer per week  . Drug use: No     Medication list has been reviewed and updated.  Current Meds  Medication Sig  . acetaminophen (TYLENOL) 500 MG tablet Take 500 mg by mouth every 6 (six) hours as needed.  Marland Kitchen amLODipine-benazepril (LOTREL) 10-20 MG capsule Take 1 capsule by mouth daily.  Marland Kitchen atorvastatin (LIPITOR) 10 MG tablet TAKE 1 TABLET BY MOUTH EVERY DAY *NEEDS OFFICE VISIT  . buPROPion (WELLBUTRIN XL) 300 MG 24 hr tablet Take 1 tablet by mouth daily. Dr Kasandra Knudsen  . busPIRone (BUSPAR) 30 MG tablet Take 1 tablet by mouth 2 (two) times daily. Dr Kasandra Knudsen  . cyclobenzaprine (FLEXERIL) 5 MG tablet Take 2 tablets (10 mg total) by mouth at bedtime.  . methylphenidate (RITALIN) 20 MG tablet Take 1 tablet by mouth 2 (two) times daily. Dr Kasandra Knudsen  . SUMAtriptan (IMITREX) 100 MG tablet Caryl Pina stepp    [facility] 2/9 Scores 09/10/2019 08/13/2019 02/01/2019 08/10/2018  PHQ - 2 Score 0 0 0 0  PHQ- 9 Score 0 0 0 0    BP Readings from Last 3 Encounters:  09/10/19 120/80  08/13/19 120/60  02/01/19 132/78    Physical Exam Vitals and nursing note reviewed.  Constitutional:      Appearance: He is well-developed. He is obese.  HENT:     Head: Normocephalic.     Jaw: There is normal jaw occlusion.     Right Ear: Hearing, tympanic membrane, ear canal and external ear normal.     Left Ear: Hearing, tympanic membrane, ear canal and  external ear normal.     Nose: Nose normal.     Right Sinus: No maxillary sinus tenderness or frontal sinus tenderness.     Left Sinus: No maxillary sinus tenderness or frontal sinus tenderness.     Mouth/Throat:     Lips: Pink.     Mouth: Mucous membranes are moist.     Palate: No mass.     Pharynx: Oropharynx is clear. Uvula midline.  Eyes:     General: Lids are normal. Vision grossly intact. Gaze aligned appropriately. No scleral icterus.       Right eye: No discharge.        Left eye: No discharge.     Conjunctiva/sclera: Conjunctivae normal.     Pupils: Pupils are equal, round, and reactive to light.     Funduscopic exam:    Right eye: Red reflex present.        Left eye: Red reflex present. Neck:  Thyroid: No thyroid mass, thyromegaly or thyroid tenderness.     Vascular: Normal carotid pulses. No carotid bruit, hepatojugular reflux or JVD.     Trachea: Trachea and phonation normal. No tracheal deviation.  Cardiovascular:     Rate and Rhythm: Normal rate and regular rhythm.     Chest Wall: PMI is not displaced. No thrill.     Pulses: Normal pulses.          Carotid pulses are 2+ on the right side and 2+ on the left side.      Radial pulses are 2+ on the right side and 2+ on the left side.       Femoral pulses are 2+ on the right side and 2+ on the left side.      Popliteal pulses are 2+ on the right side and 2+ on the left side.       Dorsalis pedis pulses are 2+ on the right side and 2+ on the left side.       Posterior tibial pulses are 2+ on the right side and 2+ on the left side.     Heart sounds: Normal heart sounds, S1 normal and S2 normal. No murmur. No systolic murmur. No friction rub. No gallop. No S3 or S4 sounds.   Pulmonary:     Effort: No respiratory distress.     Breath sounds: Normal breath sounds. No decreased breath sounds, wheezing, rhonchi or rales.  Chest:     Chest wall: No mass.     Breasts: Breasts are symmetrical.        Right: Normal.         Left: Normal.  Abdominal:     General: Bowel sounds are normal.     Palpations: Abdomen is soft. There is no hepatomegaly, splenomegaly or mass.     Tenderness: There is no abdominal tenderness. There is no guarding or rebound.     Hernia: A hernia is present. Hernia is present in the left inguinal area.  Genitourinary:    Penis: Normal.      Testes: Normal.        Right: Mass not present.        Left: Mass not present.     Epididymis:     Right: Normal.     Left: Normal.     Prostate: Enlarged. Not tender and no nodules present.     Rectum: Normal. Guaiac result negative. No mass.  Musculoskeletal:        General: No tenderness. Normal range of motion.     Cervical back: Normal, full passive range of motion without pain, normal range of motion and neck supple.     Thoracic back: Normal.     Lumbar back: Normal.     Right lower leg: No edema.     Left lower leg: No edema.  Lymphadenopathy:     Head:     Right side of head: No submental or submandibular adenopathy.     Left side of head: No submental or submandibular adenopathy.     Cervical: No cervical adenopathy.     Right cervical: No superficial, deep or posterior cervical adenopathy.    Left cervical: No superficial, deep or posterior cervical adenopathy.     Upper Body:     Right upper body: No supraclavicular, axillary or pectoral adenopathy.     Left upper body: No supraclavicular, axillary or pectoral adenopathy.     Lower Body: No right inguinal adenopathy. No left inguinal  adenopathy.  Skin:    General: Skin is warm.     Capillary Refill: Capillary refill takes less than 2 seconds.     Coloration: Skin is not ashen or cyanotic.     Findings: No rash.  Neurological:     Mental Status: He is alert and oriented to person, place, and time.     Cranial Nerves: Cranial nerves are intact. No cranial nerve deficit.     Sensory: Sensation is intact.     Motor: Motor function is intact.     Deep Tendon Reflexes: Reflexes  are normal and symmetric.  Psychiatric:        Behavior: Behavior is cooperative.     Wt Readings from Last 3 Encounters:  09/10/19 181 lb (82.1 kg)  08/13/19 186 lb (84.4 kg)  02/01/19 184 lb (83.5 kg)    BP 120/80   Pulse 60   Ht 5\' 3"  (1.6 m)   Wt 181 lb (82.1 kg)   BMI 32.06 kg/m   Assessment and Plan: 1. Annual physical exam No subjective objective concerns noted during history and physical exam.  Patient's previous visits were in valuated in chart including previous encounters, most recent labs, most recent imaging, and care everywhere.Alan Trevino is a 59 y.o. male who presents today for his Complete Annual Exam. He feels well. He reports exercising intermetant. He reports he is sleeping well. Immunizations are reviewed and recommendations provided.   Age appropriate screening tests are discussed. Counseling given for risk factor reduction interventions.  We will obtain baseline labs such as lipid panel, renal panel and PSA. - Lipid Panel With LDL/HDL Ratio - Renal Function Panel - PSA  2. Colon cancer screening On review of patient's colonoscopy it was noted that he needed to come back in 3 years as of his colonoscopy in 2017.  On evaluation there were pathology suggesting tubular adenomas and multiple polyps were assessed.  Recall to gastroenterology was that he should should have had it last year but due to Covid we were unable to get this scheduled.  We will refer to gastroenterology for evaluation of colonoscopy. - Ambulatory referral to Gastroenterology  3. 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  Recurrent.  Patient is noted that status post repair of inguinal hernia per Dr. Tollie Pizza that he has had a recurrence of the left inguinal hernia which is reducible.  We will refer to neurosurgery Dr. Tollie Pizza for reevaluation as to whether or not repeat repair is in order. - Ambulatory referral to General Surgery  4. Prostate  enlargement On physical exam was noted that patient has a symmetrically enlarged prostate that is smooth with no nodularity no tenderness.  Patient is not having any BPH symptoms but we will refer to urology as well as obtain a PSA. - Ambulatory referral to Urology

## 2019-09-11 LAB — RENAL FUNCTION PANEL
Albumin: 4.6 g/dL (ref 3.8–4.9)
BUN/Creatinine Ratio: 8 — ABNORMAL LOW (ref 9–20)
BUN: 10 mg/dL (ref 6–24)
CO2: 21 mmol/L (ref 20–29)
Calcium: 9.8 mg/dL (ref 8.7–10.2)
Chloride: 105 mmol/L (ref 96–106)
Creatinine, Ser: 1.26 mg/dL (ref 0.76–1.27)
GFR calc Af Amer: 72 mL/min/{1.73_m2} (ref >59)
GFR calc non Af Amer: 62 mL/min/{1.73_m2} (ref >59)
Glucose: 95 mg/dL (ref 65–99)
Phosphorus: 3.5 mg/dL (ref 2.8–4.1)
Potassium: 4.9 mmol/L (ref 3.5–5.2)
Sodium: 142 mmol/L (ref 134–144)

## 2019-09-11 LAB — LIPID PANEL WITH LDL/HDL RATIO
Cholesterol, Total: 184 mg/dL (ref 100–199)
HDL: 56 mg/dL (ref >39)
LDL Chol Calc (NIH): 105 mg/dL — ABNORMAL HIGH (ref 0–99)
LDL/HDL Ratio: 1.9 ratio (ref 0.0–3.6)
Triglycerides: 128 mg/dL (ref 0–149)
VLDL Cholesterol Cal: 23 mg/dL (ref 5–40)

## 2019-09-11 LAB — PSA: Prostate Specific Ag, Serum: 0.9 ng/mL (ref 0.0–4.0)

## 2019-10-14 NOTE — Progress Notes (Signed)
10/15/19 12:51 PM   Alan Trevino [birth year] [id number]  Referring provider: Juline Patch, MD [address] [place],  Alan Trevino [id number] Chief Complaint  Patient presents with  . New Patient (Initial Visit)    Prostate Enlargement    HPI: Alan Trevino is a 59 y.o. M who presents today for the evaluation and management of BPH.   Visited PCP on 09/10/19 for an annual exam. On physical exam, enlarged prostate was noted w/ no related BPH symptoms or nodules/tenderess.   He is not bothered by his urinary symptoms and is currently on blood pressure pills. He is not able to void sitting down onset a few years ago (not bothersome).   His most recent PSA 0.9 as of 09/10/19.     IPSS    Row Name 10/15/19 0900         International Prostate Symptom Score   How often have you had the sensation of not emptying your bladder? Not at All     How often have you had to urinate less than every two hours? Less than 1 in 5 times     How often have you found you stopped and started again several times when you urinated? Less than half the time     How often have you found it difficult to postpone urination? Less than 1 in 5 times     How often have you had a weak urinary stream? Less than 1 in 5 times     How often have you had to strain to start urination? Not at All     How many times did you typically get up at night to urinate? 1 Time     Total IPSS Score 6       Quality of Life due to urinary symptoms   If you were to spend the rest of your life with your urinary condition just the way it is now how would you feel about that? Mixed            Score:  1-7 Mild 8-19 Moderate 20-35 Severe  PMH: Past Medical History:  Diagnosis Date  . Depression   . H/O ulcer disease   . Hypertension   . SBO (small bowel obstruction) ([place])   . Wears dentures    full upper    Surgical History: Past Surgical History:  Procedure Laterality Date  . COLONOSCOPY WITH  PROPOFOL N/A 12/29/2015   Procedure: COLONOSCOPY WITH PROPOFOL;  Surgeon: [name], MD;  Location: [place];  Service: Endoscopy;  Laterality: N/A;  . ESOPHAGEAL DILATION     stretching  . ESOPHAGEAL DILATION  01/17/2017   Procedure: ESOPHAGEAL DILATION;  Surgeon: [name], MD;  Location: [place];  Service: Gastroenterology;;  . ESOPHAGOGASTRODUODENOSCOPY (EGD) WITH PROPOFOL N/A 11/29/2016   Procedure: ESOPHAGOGASTRODUODENOSCOPY (EGD) WITH PROPOFOL;  Surgeon: [name], MD;  [facility] ENDOSCOPY;  Service: Endoscopy;  Laterality: N/A;  . ESOPHAGOGASTRODUODENOSCOPY (EGD) WITH PROPOFOL N/A 01/17/2017   Procedure: ESOPHAGOGASTRODUODENOSCOPY (EGD) WITH PROPOFOL;  Surgeon: [name], MD;  Location: [place];  Service: Gastroenterology;  Laterality: N/A;  . HERNIA REPAIR    . POLYPECTOMY N/A 12/29/2015   Procedure: POLYPECTOMY;  Surgeon: [name], MD;  Location: [place];  Service: Endoscopy;  Laterality: N/A;  . POLYPECTOMY  01/17/2017   Procedure: POLYPECTOMY;  Surgeon: [name], MD;  Location: [place];  Service: Gastroenterology;;  . Alan Trevino SURGERY  09/2009    Home  Medications:  Allergies as of 10/15/2019   No Known Allergies     Medication List       Accurate as of October 15, 2019 11:59 PM. If you have any questions, ask your nurse or doctor.        acetaminophen 500 MG tablet Commonly known as: TYLENOL Take 500 mg by mouth every 6 (six) hours as needed.   amLODipine-benazepril 10-20 MG capsule Commonly known as: LOTREL Take 1 capsule by mouth daily.   atorvastatin 10 MG tablet Commonly known as: LIPITOR TAKE 1 TABLET BY MOUTH EVERY DAY *NEEDS OFFICE VISIT   buPROPion 300 MG 24 hr tablet Commonly known as: WELLBUTRIN XL Take 1 tablet by mouth daily. Dr Kasandra Knudsen   busPIRone 30 MG tablet Commonly known as: BUSPAR Take 1 tablet by mouth 2 (two) times daily. Dr Kasandra Knudsen   cyclobenzaprine 5 MG tablet Commonly  known as: FLEXERIL Take 2 tablets (10 mg total) by mouth at bedtime.   methylphenidate 20 MG tablet Commonly known as: RITALIN Take 1 tablet by mouth 2 (two) times daily. Dr Kasandra Knudsen   SUMAtriptan 100 MG tablet Commonly known as: Barton Fanny Trevino   venlafaxine 75 MG tablet Commonly known as: EFFEXOR Take 1 tablet by mouth 2 (two) times daily. Alan Trevino       Allergies: No Known Allergies  Family History: Family History  Problem Relation Age of Onset  . Ovarian cancer Mother   . CAD Father     Social History:  reports that he quit smoking about 21 years ago. His smokeless tobacco use includes chew. He reports current alcohol use of about 12.0 standard drinks of alcohol per week. He reports that he does not use drugs.   Physical Exam: BP 121/78   Pulse 66   Ht 5\' 2"  (1.575 m)   Wt 184 lb (83.5 kg)   BMI 33.65 kg/m   Constitutional:  Alert and oriented, No acute distress. HEENT: [place] AT, moist mucus membranes.  Trachea midline, no masses. Cardiovascular: No clubbing, cyanosis, or edema. Respiratory: Normal respiratory effort, no increased work of breathing. Skin: No rashes, bruises or suspicious lesions. Neurologic: Grossly intact, no focal deficits, moving all 4 extremities. Psychiatric: Normal mood and affect.  Laboratory Data:  Lab Results  Component Value Date   CREATININE 1.26 09/10/2019   Assessment & Plan:    1. BPH w/o LUTS  Enlarged prostate on physical exam done by PCP No bothersome urinary symptoms.  Adequate bladder emptying PSA screening up to date Briefly discussed that if his symptoms ever worsen, can consider medications versus procedural/surgical intervention F/u prn    [place] [address], [place], [place] [address] [phone]  I, Alan Trevino, am acting as a scribe for Dr. Hollice Espy,  I have reviewed the above documentation for accuracy and completeness, and I agree with the  above.   Hollice Espy, MD

## 2019-10-15 ENCOUNTER — Other Ambulatory Visit
Admission: RE | Admit: 2019-10-15 | Discharge: 2019-10-15 | Disposition: A | Discharge status: Home / Self Care | Payer: BC Managed Care – PPO | Attending: Urology | Admitting: Urology

## 2019-10-15 ENCOUNTER — Ambulatory Visit (INDEPENDENT_AMBULATORY_CARE_PROVIDER_SITE_OTHER): Payer: BC Managed Care – PPO | Admitting: Urology

## 2019-10-15 ENCOUNTER — Other Ambulatory Visit: Payer: Self-pay

## 2019-10-15 ENCOUNTER — Encounter: Payer: Self-pay | Admitting: Urology

## 2019-10-15 ENCOUNTER — Other Ambulatory Visit: Payer: Self-pay | Admitting: *Deleted

## 2019-10-15 VITALS — BP 121/78 | HR 66 | Ht 62.0 in | Wt 184.0 lb

## 2019-10-15 DIAGNOSIS — N400 Enlarged prostate without lower urinary tract symptoms: Secondary | ICD-10-CM | POA: Diagnosis present

## 2019-10-15 LAB — URINALYSIS, COMPLETE (UACMP) WITH MICROSCOPIC
Bacteria, UA: NONE SEEN
Bilirubin Urine: NEGATIVE
Glucose, UA: NEGATIVE mg/dL
Ketones, ur: NEGATIVE mg/dL
Leukocytes,Ua: NEGATIVE
Nitrite: NEGATIVE
Protein, ur: NEGATIVE mg/dL
Specific Gravity, Urine: 1.02 (ref 1.005–1.030)
Squamous Epithelial / LPF: NONE SEEN (ref 0–5)
pH: 5.5 (ref 5.0–8.0)

## 2019-10-22 ENCOUNTER — Telehealth: Payer: BC Managed Care – PPO

## 2019-10-28 ENCOUNTER — Telehealth (INDEPENDENT_AMBULATORY_CARE_PROVIDER_SITE_OTHER): Payer: Self-pay | Admitting: Gastroenterology

## 2019-10-28 ENCOUNTER — Other Ambulatory Visit: Payer: Self-pay

## 2019-10-28 DIAGNOSIS — Z86010 Personal history of colonic polyps: Secondary | ICD-10-CM

## 2019-10-28 NOTE — Progress Notes (Signed)
Gastroenterology Pre-Procedure Review  Request Date: Monday 11/29/19 Requesting Physician: Dr. Allen Norris  PATIENT REVIEW QUESTIONS: The patient responded to the following health history questions as indicated:    1. Are you having any GI issues? no 2. Do you have a personal history of Polyps? yes (12/29/15 with Dr. Allen Norris polys were noted.) 3. Do you have a family history of Colon Cancer or Polyps? no 4. Diabetes Mellitus? no 5. Joint replacements in the past 12 months?no 6. Major health problems in the past 3 months?no 7. Any artificial heart valves, MVP, or defibrillator?no    MEDICATIONS & ALLERGIES:    Patient reports the following regarding taking any anticoagulation/antiplatelet therapy:   Plavix, Coumadin, Eliquis, Xarelto, Lovenox, Pradaxa, Brilinta, or Effient? no Aspirin? no  Patient confirms/reports the following medications:  Current Outpatient Medications  Medication Sig Dispense Refill  . acetaminophen (TYLENOL) 500 MG tablet Take 500 mg by mouth every 6 (six) hours as needed.    Marland Kitchen amLODipine-benazepril (LOTREL) 10-20 MG capsule Take 1 capsule by mouth daily. 90 capsule 1  . atorvastatin (LIPITOR) 10 MG tablet TAKE 1 TABLET BY MOUTH EVERY DAY *NEEDS OFFICE VISIT 90 tablet 1  . buPROPion (WELLBUTRIN XL) 300 MG 24 hr tablet Take 1 tablet by mouth daily. Dr Kasandra Knudsen    . busPIRone (BUSPAR) 30 MG tablet Take 1 tablet by mouth 2 (two) times daily. Dr Kasandra Knudsen    . cyclobenzaprine (FLEXERIL) 5 MG tablet Take 2 tablets (10 mg total) by mouth at bedtime. 180 tablet 1  . methylphenidate (RITALIN) 20 MG tablet Take 1 tablet by mouth 2 (two) times daily. Dr Kasandra Knudsen    . SUMAtriptan (IMITREX) 100 MG tablet Caryl Pina stepp    . venlafaxine (EFFEXOR) 75 MG tablet Take 1 tablet by mouth 2 (two) times daily. Caryl Pina stepp     No current facility-administered medications for this visit.    Patient confirms/reports the following allergies:  No Known Allergies  No orders of the defined types were placed in  this encounter.   AUTHORIZATION INFORMATION Primary Insurance: 1D#: Group #:  Secondary Insurance: 1D#: Group #:  SCHEDULE INFORMATION: Date: Monday 11/29/19 Time: Location:[facility]

## 2019-11-08 ENCOUNTER — Other Ambulatory Visit: Payer: Self-pay | Admitting: General Surgery

## 2019-11-23 ENCOUNTER — Other Ambulatory Visit: Payer: Self-pay

## 2019-11-23 ENCOUNTER — Encounter: Payer: Self-pay | Admitting: Gastroenterology

## 2019-11-25 ENCOUNTER — Other Ambulatory Visit
Admission: RE | Admit: 2019-11-25 | Discharge: 2019-11-25 | Disposition: A | Discharge status: Home / Self Care | Payer: BC Managed Care – PPO | Source: Ambulatory Visit | Attending: Gastroenterology | Admitting: Gastroenterology

## 2019-11-25 ENCOUNTER — Other Ambulatory Visit: Payer: Self-pay

## 2019-11-25 DIAGNOSIS — Z01812 Encounter for preprocedural laboratory examination: Secondary | ICD-10-CM | POA: Insufficient documentation

## 2019-11-25 DIAGNOSIS — Z20822 Contact with and (suspected) exposure to covid-19: Secondary | ICD-10-CM | POA: Diagnosis not present

## 2019-11-26 LAB — SARS CORONAVIRUS 2 (TAT 6-24 HRS): SARS Coronavirus 2: NEGATIVE

## 2019-11-26 NOTE — Discharge Instructions (Signed)
General Anesthesia, Adult, Care After This sheet gives you information about how to care for yourself after your procedure. Your health care provider may also give you more specific instructions. If you have problems or questions, contact your health care provider. What can I expect after the procedure? After the procedure, the following side effects are common:  Pain or discomfort at the IV site.  Nausea.  Vomiting.  Sore throat.  Trouble concentrating.  Feeling cold or chills.  Weak or tired.  Sleepiness and fatigue.  Soreness and body aches. These side effects can affect parts of the body that were not involved in surgery. Follow these instructions at home:  For at least 24 hours after the procedure:  Have a responsible adult stay with you. It is important to have someone help care for you until you are awake and alert.  Rest as needed.  Do not: ? Participate in activities in which you could fall or become injured. ? Drive. ? Use heavy machinery. ? Drink alcohol. ? Take sleeping pills or medicines that cause drowsiness. ? Make important decisions or sign legal documents. ? Take care of children on your own. Eating and drinking  Follow any instructions from your health care provider about eating or drinking restrictions.  When you feel hungry, start by eating small amounts of foods that are soft and easy to digest (bland), such as toast. Gradually return to your regular diet.  Drink enough fluid to keep your urine pale yellow.  If you vomit, rehydrate by drinking water, juice, or clear broth. General instructions  If you have sleep apnea, surgery and certain medicines can increase your risk for breathing problems. Follow instructions from your health care provider about wearing your sleep device: ? Anytime you are sleeping, including during daytime naps. ? While taking prescription pain medicines, sleeping medicines, or medicines that make you drowsy.  Return to  your normal activities as told by your health care provider. Ask your health care provider what activities are safe for you.  Take over-the-counter and prescription medicines only as told by your health care provider.  If you smoke, do not smoke without supervision.  Keep all follow-up visits as told by your health care provider. This is important. Contact a health care provider if:  You have nausea or vomiting that does not get better with medicine.  You cannot eat or drink without vomiting.  You have pain that does not get better with medicine.  You are unable to pass urine.  You develop a skin rash.  You have a fever.  You have redness around your IV site that gets worse. Get help right away if:  You have difficulty breathing.  You have chest pain.  You have blood in your urine or stool, or you vomit blood. Summary  After the procedure, it is common to have a sore throat or nausea. It is also common to feel tired.  Have a responsible adult stay with you for the first 24 hours after general anesthesia. It is important to have someone help care for you until you are awake and alert.  When you feel hungry, start by eating small amounts of foods that are soft and easy to digest (bland), such as toast. Gradually return to your regular diet.  Drink enough fluid to keep your urine pale yellow.  Return to your normal activities as told by your health care provider. Ask your health care provider what activities are safe for you. This information is not   intended to replace advice given to you by your health care provider. Make sure you discuss any questions you have with your health care provider. Document Revised: 04/11/2017 Document Reviewed: 11/22/2016 Elsevier Patient Education  2020 Elsevier Inc.  

## 2019-11-29 ENCOUNTER — Other Ambulatory Visit: Payer: Self-pay | Admitting: Gastroenterology

## 2019-11-29 ENCOUNTER — Ambulatory Visit: Payer: BC Managed Care – PPO | Admitting: Anesthesiology

## 2019-11-29 ENCOUNTER — Ambulatory Visit
Admission: RE | Admit: 2019-11-29 | Discharge: 2019-11-29 | Disposition: A | Discharge status: Home / Self Care | Payer: BC Managed Care – PPO | Attending: Gastroenterology | Admitting: Gastroenterology

## 2019-11-29 ENCOUNTER — Encounter: Payer: Self-pay | Admitting: Gastroenterology

## 2019-11-29 ENCOUNTER — Other Ambulatory Visit: Payer: Self-pay

## 2019-11-29 ENCOUNTER — Encounter
Admission: RE | Disposition: A | Discharge status: Home / Self Care | Payer: Self-pay | Source: Home / Self Care | Attending: Gastroenterology

## 2019-11-29 DIAGNOSIS — K648 Other hemorrhoids: Secondary | ICD-10-CM | POA: Insufficient documentation

## 2019-11-29 DIAGNOSIS — Z8249 Family history of ischemic heart disease and other diseases of the circulatory system: Secondary | ICD-10-CM | POA: Insufficient documentation

## 2019-11-29 DIAGNOSIS — K5730 Diverticulosis of large intestine without perforation or abscess without bleeding: Secondary | ICD-10-CM | POA: Insufficient documentation

## 2019-11-29 DIAGNOSIS — Z860100 Personal history of colon polyps, unspecified: Secondary | ICD-10-CM

## 2019-11-29 DIAGNOSIS — I10 Essential (primary) hypertension: Secondary | ICD-10-CM | POA: Diagnosis not present

## 2019-11-29 DIAGNOSIS — F329 Major depressive disorder, single episode, unspecified: Secondary | ICD-10-CM | POA: Diagnosis not present

## 2019-11-29 DIAGNOSIS — Z1211 Encounter for screening for malignant neoplasm of colon: Secondary | ICD-10-CM | POA: Insufficient documentation

## 2019-11-29 DIAGNOSIS — K621 Rectal polyp: Secondary | ICD-10-CM | POA: Insufficient documentation

## 2019-11-29 DIAGNOSIS — Z87891 Personal history of nicotine dependence: Secondary | ICD-10-CM | POA: Insufficient documentation

## 2019-11-29 DIAGNOSIS — Z79899 Other long term (current) drug therapy: Secondary | ICD-10-CM | POA: Insufficient documentation

## 2019-11-29 HISTORY — PX: POLYPECTOMY: SHX5525

## 2019-11-29 HISTORY — PX: COLONOSCOPY WITH PROPOFOL: SHX5780

## 2019-11-29 SURGERY — COLONOSCOPY WITH PROPOFOL
Anesthesia: General | Site: Rectum

## 2019-11-29 MED ORDER — STERILE WATER FOR IRRIGATION IR SOLN
Status: DC | PRN
  Administered 2019-11-29: 50 mL

## 2019-11-29 MED ORDER — ONDANSETRON HCL 4 MG/2ML IJ SOLN: 4.0000 mg | Freq: Once | INTRAMUSCULAR | Status: DC | PRN

## 2019-11-29 MED ORDER — ACETAMINOPHEN 10 MG/ML IV SOLN: 1000.0000 mg | Freq: Once | INTRAVENOUS | Status: DC | PRN

## 2019-11-29 MED ORDER — LIDOCAINE HCL (CARDIAC) PF 100 MG/5ML IV SOSY
PREFILLED_SYRINGE | INTRAVENOUS | Status: DC | PRN
  Administered 2019-11-29: 30 mg via INTRAVENOUS

## 2019-11-29 MED ORDER — LACTATED RINGERS IV SOLN: INTRAVENOUS | Status: DC

## 2019-11-29 MED ORDER — PROPOFOL 10 MG/ML IV BOLUS
INTRAVENOUS | Status: DC | PRN
  Administered 2019-11-29: 30 mg via INTRAVENOUS
  Administered 2019-11-29: 160 mg via INTRAVENOUS
  Administered 2019-11-29 (×3): 40 mg via INTRAVENOUS

## 2019-11-29 SURGICAL SUPPLY — 24 items
CLIP HMST 235XBRD CATH ROT (MISCELLANEOUS) IMPLANT
CLIP RESOLUTION 360 11X235 (MISCELLANEOUS)
ELECT REM PT RETURN 9FT ADLT (ELECTROSURGICAL)
ELECTRODE REM PT RTRN 9FT ADLT (ELECTROSURGICAL) IMPLANT
FCP ESCP3.2XJMB 240X2.8X (MISCELLANEOUS)
FORCEPS BIOP RAD 4 LRG CAP 4 (CUTTING FORCEPS) ×3 IMPLANT
FORCEPS BIOP RJ4 240 W/NDL (MISCELLANEOUS)
FORCEPS ESCP3.2XJMB 240X2.8X (MISCELLANEOUS) IMPLANT
GOWN CVR UNV OPN BCK APRN NK (MISCELLANEOUS) ×2 IMPLANT
GOWN ISOL THUMB LOOP REG UNIV (MISCELLANEOUS) ×6
INJECTOR VARIJECT VIN23 (MISCELLANEOUS) IMPLANT
KIT DEFENDO VALVE AND CONN (KITS) IMPLANT
KIT ENDO PROCEDURE OLY (KITS) ×3 IMPLANT
MANIFOLD NEPTUNE II (INSTRUMENTS) ×3 IMPLANT
MARKER SPOT ENDO TATTOO 5ML (MISCELLANEOUS) IMPLANT
PROBE APC STR FIRE (PROBE) IMPLANT
RETRIEVER NET ROTH 2.5X230 LF (MISCELLANEOUS) IMPLANT
SNARE SHORT THROW 13M SML OVAL (MISCELLANEOUS) IMPLANT
SNARE SHORT THROW 30M LRG OVAL (MISCELLANEOUS) IMPLANT
SNARE SNG USE RND 15MM (INSTRUMENTS) IMPLANT
SPOT EX ENDOSCOPIC TATTOO (MISCELLANEOUS)
TRAP ETRAP POLY (MISCELLANEOUS) IMPLANT
VARIJECT INJECTOR VIN23 (MISCELLANEOUS)
WATER STERILE IRR 250ML POUR (IV SOLUTION) ×3 IMPLANT

## 2019-11-29 NOTE — Anesthesia Postprocedure Evaluation (Signed)
Anesthesia Post Note  Patient: Alan Trevino  Procedure(s) Performed: COLONOSCOPY WITH PROPOFOL (N/A Rectum) POLYPECTOMY (N/A Rectum)     Patient location during evaluation: PACU Anesthesia Type: General Level of consciousness: awake and alert Pain management: pain level controlled Vital Signs Assessment: post-procedure vital signs reviewed and stable Respiratory status: spontaneous breathing, nonlabored ventilation, respiratory function stable and patient connected to nasal cannula oxygen Cardiovascular status: blood pressure returned to baseline and stable Postop Assessment: no apparent nausea or vomiting Anesthetic complications: no   No complications documented.  Aylanie Cubillos A  Alea Ryer

## 2019-11-29 NOTE — H&P (Signed)
[name], MD [facility] [address], [place], [place] [address] Phone:[phone] Fax : [phone]  Primary Care Physician:  Juline Patch, MD Primary Gastroenterologist:  Dr. Allen Norris  Pre-Procedure History & Physical: HPI:  Alan Trevino is a 59 y.o. male is here for an colonoscopy.   Past Medical History:  Diagnosis Date  . Depression   . H/O ulcer disease   . Hypertension   . SBO (small bowel obstruction) ([place])   . Wears dentures    full upper    Past Surgical History:  Procedure Laterality Date  . COLONOSCOPY WITH PROPOFOL N/A 12/29/2015   Procedure: COLONOSCOPY WITH PROPOFOL;  Surgeon: [name], MD;  Location: [place];  Service: Endoscopy;  Laterality: N/A;  . ESOPHAGEAL DILATION     stretching  . ESOPHAGEAL DILATION  01/17/2017   Procedure: ESOPHAGEAL DILATION;  Surgeon: [name], MD;  Location: [place];  Service: Gastroenterology;;  . ESOPHAGOGASTRODUODENOSCOPY (EGD) WITH PROPOFOL N/A 11/29/2016   Procedure: ESOPHAGOGASTRODUODENOSCOPY (EGD) WITH PROPOFOL;  Surgeon: [name], MD;  [facility] ENDOSCOPY;  Service: Endoscopy;  Laterality: N/A;  . ESOPHAGOGASTRODUODENOSCOPY (EGD) WITH PROPOFOL N/A 01/17/2017   Procedure: ESOPHAGOGASTRODUODENOSCOPY (EGD) WITH PROPOFOL;  Surgeon: [name], MD;  Location: [place];  Service: Gastroenterology;  Laterality: N/A;  . HERNIA REPAIR    . POLYPECTOMY N/A 12/29/2015   Procedure: POLYPECTOMY;  Surgeon: [name], MD;  Location: [place];  Service: Endoscopy;  Laterality: N/A;  . POLYPECTOMY  01/17/2017   Procedure: POLYPECTOMY;  Surgeon: [name], MD;  Location: [place];  Service: Gastroenterology;;  . SHOULDER SURGERY  09/2009    Prior to Admission medications   Medication Sig Start Date End Date Taking? Authorizing Provider  acetaminophen (TYLENOL) 500 MG tablet Take 1,000 mg by mouth every 6 (six) hours as needed for moderate pain.     Yes [provider]  amLODipine-benazepril (LOTREL) 10-20 MG capsule Take 1 capsule by mouth daily. 08/13/19  Yes Juline Patch, MD  atorvastatin (LIPITOR) 10 MG tablet TAKE 1 TABLET BY MOUTH EVERY DAY *NEEDS OFFICE VISIT Patient taking differently: Take 10 mg by mouth daily.  08/13/19  Yes Juline Patch, MD  buPROPion (WELLBUTRIN XL) 300 MG 24 hr tablet Take 300 mg by mouth daily.  10/11/13  Yes [provider]  busPIRone (BUSPAR) 30 MG tablet Take 30 mg by mouth 2 (two) times daily.    Yes [provider]  cyclobenzaprine (FLEXERIL) 5 MG tablet Take 2 tablets (10 mg total) by mouth at bedtime. Patient taking differently: Take 10 mg by mouth at bedtime as needed for muscle spasms.  08/13/19  Yes Juline Patch, MD  methylphenidate (RITALIN) 20 MG tablet Take 20 mg by mouth 2 (two) times daily with breakfast and lunch.    Yes [provider]  SUMAtriptan (IMITREX) 100 MG tablet Take 100 mg by mouth every 2 (two) hours as needed for migraine.  08/06/18  Yes [provider]  venlafaxine (EFFEXOR) 75 MG tablet Take 1 tablet by mouth 2 (two) times daily. Caryl Pina stepp Patient not taking: Reported on 11/16/2019 08/06/18   [provider]    Allergies as of 10/28/2019  . (No Known Allergies)    Family History  Problem Relation Age of Onset  . Ovarian cancer Mother   . CAD Father     Social History   Socioeconomic History  . Marital status: Divorced    Spouse name: Not on file  .  Number of children: Not on file  . Years of education: Not on file  . Highest education level: Not on file  Occupational History  . Not on file  Tobacco Use  . Smoking status: Former Smoker    Quit date: 2000    Years since quitting: 21.6  . Smokeless tobacco: Current User    Types: Chew  Vaping Use  . Vaping Use: Never used  Substance and Sexual Activity  . Alcohol use: Yes    Alcohol/week: 12.0 standard drinks    Types: 12 Cans of beer per week  .  Drug use: No  . Sexual activity: Not Currently  Other Topics Concern  . Not on file  Social History Narrative  . Not on file   Social Determinants of Health   Financial Resource Strain:   . Difficulty of Paying Living Expenses:   Food Insecurity:   . Worried About Charity fundraiser in the Last Year:   . Arboriculturist in the Last Year:   Transportation Needs:   . Film/video editor (Medical):   Marland Kitchen Lack of Transportation (Non-Medical):   Physical Activity:   . Days of Exercise per Week:   . Minutes of Exercise per Session:   Stress:   . Feeling of Stress :   Social Connections:   . Frequency of Communication with Friends and Family:   . Frequency of Social Gatherings with Friends and Family:   . Attends Religious Services:   . Active Member of Clubs or Organizations:   . Attends Archivist Meetings:   Marland Kitchen Marital Status:   Intimate Partner Violence:   . Fear of Current or Ex-Partner:   . Emotionally Abused:   Marland Kitchen Physically Abused:   . Sexually Abused:     Review of Systems: See HPI, otherwise negative ROS  Physical Exam: BP (!) 159/85   Pulse 72   Temp 97.6 F (36.4 C) (Temporal)   Ht 5\' 2"  (1.575 m)   Wt 82.1 kg   SpO2 94%   BMI 33.11 kg/m  General:   Alert,  pleasant and cooperative in NAD Head:  Normocephalic and atraumatic. Neck:  Supple; no masses or thyromegaly. Lungs:  Clear throughout to auscultation.    Heart:  Regular rate and rhythm. Abdomen:  Soft, nontender and nondistended. Normal bowel sounds, without guarding, and without rebound.   Neurologic:  Alert and  oriented x4;  grossly normal neurologically.  Impression/Plan: Alan Trevino is here for an colonoscopy to be performed for a history of adenomatous polyps on 12/2015   Risks, benefits, limitations, and alternatives regarding  colonoscopy have been reviewed with the patient.  Questions have been answered.  All parties agreeable.   [name], MD  11/29/2019, 11:04 AM

## 2019-11-29 NOTE — Transfer of Care (Signed)
Immediate Anesthesia Transfer of Care Note  Patient: Alan Trevino  Procedure(s) Performed: COLONOSCOPY WITH PROPOFOL (N/A Rectum) POLYPECTOMY (N/A Rectum)  Patient Location: PACU  Anesthesia Type: General  Level of Consciousness: awake, alert  and patient cooperative  Airway and Oxygen Therapy: Patient Spontanous Breathing and Patient connected to supplemental oxygen  Post-op Assessment: Post-op Vital signs reviewed, Patient's Cardiovascular Status Stable, Respiratory Function Stable, Patent Airway and No signs of Nausea or vomiting  Post-op Vital Signs: Reviewed and stable  Complications: No complications documented.

## 2019-11-29 NOTE — Anesthesia Procedure Notes (Signed)
Date/Time: 11/29/2019 11:11 AM Performed by: Cameron Ali, CRNA Pre-anesthesia Checklist: Patient identified, Emergency Drugs available, Suction available, Timeout performed and Patient being monitored Patient Re-evaluated:Patient Re-evaluated prior to induction Oxygen Delivery Method: Nasal cannula Placement Confirmation: positive ETCO2

## 2019-11-29 NOTE — Op Note (Signed)
[facility] Gastroenterology Patient Name: Alan Trevino Procedure Date: 11/29/2019 11:03 AM MRN: [id number] Account #: [id number] Date of Birth: 18-Sep-1960 Admit Type: Outpatient Age: 59 Room: [facility] OR ROOM 01 Gender: Male Note Status: Finalized Procedure:             Colonoscopy Indications:           High risk colon cancer surveillance: Personal history                         of colonic polyps Providers:             [name] MD, MD Referring MD:          Juline Patch, MD (Referring MD) Medicines:             Propofol per Anesthesia Complications:         No immediate complications. Procedure:             Pre-Anesthesia Assessment:                        - Prior to the procedure, a History and Physical was                         performed, and patient medications and allergies were                         reviewed. The patient's tolerance of previous                         anesthesia was also reviewed. The risks and benefits                         of the procedure and the sedation options and risks                         were discussed with the patient. All questions were                         answered, and informed consent was obtained. Prior                         Anticoagulants: The patient has taken no previous                         anticoagulant or antiplatelet agents. ASA Grade                         Assessment: II - A patient with mild systemic disease.                         After reviewing the risks and benefits, the patient                         was deemed in satisfactory condition to undergo the                         procedure.  After obtaining informed consent, the colonoscope was                         passed under direct vision. Throughout the procedure,                         the patient's blood pressure, pulse, and oxygen                         saturations were monitored continuously. The was                          introduced through the anus and advanced to the the                         cecum, identified by appendiceal orifice and ileocecal                         valve. The colonoscopy was performed without                         difficulty. The patient tolerated the procedure well.                         The quality of the bowel preparation was excellent. Findings:      The perianal and digital rectal examinations were normal.      Non-bleeding internal hemorrhoids were found during retroflexion. The       hemorrhoids were Grade I (internal hemorrhoids that do not prolapse).      A 4 mm polyp was found in the rectum. The polyp was sessile. The polyp       was removed with a cold biopsy forceps. Resection and retrieval were       complete.      Multiple small-mouthed diverticula were found in the sigmoid colon. Impression:            - Non-bleeding internal hemorrhoids.                        - One 4 mm polyp in the rectum, removed with a cold                         biopsy forceps. Resected and retrieved.                        - Diverticulosis in the sigmoid colon. Recommendation:        - Discharge patient to home.                        - Resume previous diet.                        - Continue present medications.                        - Await pathology results.                        - Repeat colonoscopy in 7 years for surveillance. Procedure Code(s):     --- Professional ---  45380, Colonoscopy, flexible; with biopsy, single or                         multiple Diagnosis Code(s):     --- Professional ---                        Z86.010, Personal history of colonic polyps                        K62.1, Rectal polyp CPT copyright 2019 American Medical Association. All rights reserved. The codes documented in this report are preliminary and upon coder review may  be revised to meet current compliance requirements. [name] MD, MD 11/29/2019 11:30:54  AM This report has been signed electronically. Number of Addenda: 0 Note Initiated On: 11/29/2019 11:03 AM Scope Withdrawal Time: 0 hours 8 minutes 55 seconds  Total Procedure Duration: 0 hours 12 minutes 41 seconds  Estimated Blood Loss:  Estimated blood loss: none.      [facility]

## 2019-11-29 NOTE — Anesthesia Preprocedure Evaluation (Signed)
Anesthesia Evaluation  Patient identified by MRN, date of birth, ID band Patient awake    Reviewed: Allergy & Precautions, NPO status , Patient's Chart, lab work & pertinent test results, reviewed documented beta blocker date and time   History of Anesthesia Complications Negative for: history of anesthetic complications  Airway Mallampati: III  TM Distance: >3 FB Neck ROM: Full    Dental  (+) Upper Dentures   Pulmonary former smoker,    breath sounds clear to auscultation       Cardiovascular hypertension, (-) angina(-) DOE  Rhythm:Regular Rate:Normal   HLD   Neuro/Psych PSYCHIATRIC DISORDERS Depression  Neuromuscular disease (Sciatica)    GI/Hepatic hiatal hernia, PUD, neg GERD  , Esophageal stricture   Endo/Other    Renal/GU      Musculoskeletal  (+) Arthritis ,   Abdominal (+) + obese (BMI 33),   Peds  Hematology  (+) anemia ,   Anesthesia Other Findings   Reproductive/Obstetrics                            Anesthesia Physical Anesthesia Plan  ASA: II  Anesthesia Plan: General   Post-op Pain Management:    Induction: Intravenous  PONV Risk Score and Plan: 2 and Propofol infusion, TIVA and Treatment may vary due to age or medical condition  Airway Management Planned: Natural Airway and Nasal Cannula  Additional Equipment:   Intra-op Plan:   Post-operative Plan:   Informed Consent: I have reviewed the patients History and Physical, chart, labs and discussed the procedure including the risks, benefits and alternatives for the proposed anesthesia with the patient or authorized representative who has indicated his/her understanding and acceptance.       Plan Discussed with: CRNA and Anesthesiologist  Anesthesia Plan Comments:         Anesthesia Quick Evaluation

## 2019-11-30 ENCOUNTER — Inpatient Hospital Stay: Admission: RE | Admit: 2019-11-30 | Payer: BC Managed Care – PPO | Source: Ambulatory Visit

## 2019-11-30 ENCOUNTER — Encounter: Payer: Self-pay | Admitting: Gastroenterology

## 2019-12-01 LAB — SURGICAL PATHOLOGY

## 2019-12-02 ENCOUNTER — Encounter: Payer: Self-pay | Admitting: Gastroenterology

## 2019-12-02 LAB — PATHOLOGY

## 2019-12-09 ENCOUNTER — Other Ambulatory Visit: Payer: BC Managed Care – PPO

## 2020-01-04 ENCOUNTER — Other Ambulatory Visit: Payer: Self-pay

## 2020-01-04 ENCOUNTER — Encounter
Admission: RE | Admit: 2020-01-04 | Discharge: 2020-01-04 | Disposition: A | Discharge status: Home / Self Care | Payer: BC Managed Care – PPO | Source: Ambulatory Visit | Attending: General Surgery | Admitting: General Surgery

## 2020-01-04 DIAGNOSIS — Z01818 Encounter for other preprocedural examination: Secondary | ICD-10-CM | POA: Insufficient documentation

## 2020-01-04 DIAGNOSIS — I10 Essential (primary) hypertension: Secondary | ICD-10-CM | POA: Insufficient documentation

## 2020-01-04 HISTORY — DX: Headache, unspecified: R51.9

## 2020-01-04 HISTORY — DX: Gastro-esophageal reflux disease without esophagitis: K21.9

## 2020-01-04 NOTE — Patient Instructions (Addendum)
Your procedure is scheduled on: 01-14-20 FRIDAY Report to Same Day Surgery 2nd floor medical mall [facility] Entrance-take elevator on left to 2nd floor.  Check in with surgery information desk.) To find out your arrival time please call [phone] between 1PM - 3PM on 01-13-20 THURSDAY  Remember: Instructions that are not followed completely may result in serious medical risk, up to and including death, or upon the discretion of your surgeon and anesthesiologist your surgery may need to be rescheduled.    _x___ 1. Do not eat food after midnight the night before your procedure. NO GUM OR CANDY AFTER MIDNIGHT. You may drink clear liquids up to 2 hours before you are scheduled to arrive at the hospital for your procedure.  Do not drink clear liquids within 2 hours of your scheduled arrival to the hospital.  Clear liquids include  --Water or Apple juice without pulp  --Gatorade  --Black Coffee or Clear Tea (No milk, no creamers, do not add anything to the coffee or Tea-OK TO ADD SUGAR)     __x__ 2. No Alcohol for 24 hours before or after surgery.   __x__3. No Smoking or e-cigarettes for 24 prior to surgery.  Do not use any chewable tobacco products for at least 6 hour prior to surgery   ____  4. Bring all medications with you on the day of surgery if instructed.    __x__ 5. Notify your doctor if there is any change in your medical condition     (cold, fever, infections).    x___6. On the morning of surgery brush your teeth with toothpaste and water.  You may rinse your mouth with mouth wash if you wish.  Do not swallow any toothpaste or mouthwash.   Do not wear jewelry, make-up, hairpins, clips or nail polish.  Do not wear lotions, powders, or perfumes.  Do not shave 48 hours prior to surgery. Men may shave face and neck.  Do not bring valuables to the hospital.    [facility] is not responsible for any belongings or valuables.               Contacts, dentures or bridgework may not  be worn into surgery.  Leave your suitcase in the car. After surgery it may be brought to your room.  For patients admitted to the hospital, discharge time is determined by your treatment team.  _  Patients discharged the day of surgery will not be allowed to drive home.  You will need someone to drive you home and stay with you the night of your procedure.    Please read over the following fact sheets that you were given:   [facility] Preparing for Surgery   _x___ TAKE THE FOLLOWING MEDICATION THE MORNING OF SURGERY WITH A SMALL SIP OF WATER. These include:  1. LIPITOR (ATORVASTATIN)  2. WELLBUTRIN (BUPROPION)  3. BUSPAR (BUSPIRONE)   4.   5.  6.  ____Fleets enema or Magnesium Citrate as directed.   _x___ Use CHG Soap as directed on instruction sheet   ____ Use inhalers on the day of surgery and bring to hospital day of surgery  ____ Stop Metformin and Janumet 2 days prior to surgery.    ____ Take 1/2 of usual insulin dose the night before surgery and none on the morning surgery.   ____ Follow recommendations from Cardiologist, Pulmonologist or PCP regarding stopping Aspirin, Coumadin, Plavix ,Eliquis, Effient, or Pradaxa, and Pletal.  X____Stop Anti-inflammatories such as Advil, Aleve, Ibuprofen,  Motrin, Naproxen, Naprosyn, Goodies powders or aspirin products NOW-OK to take Tylenol    ____ Stop supplements until after surgery.   ____ Bring C-Pap to the hospital.     [facility]-UP COVID SWAB ON 01-12-20 BETWEEN 8AM-1PM

## 2020-01-07 ENCOUNTER — Other Ambulatory Visit: Payer: Self-pay

## 2020-01-07 ENCOUNTER — Encounter
Admission: RE | Admit: 2020-01-07 | Discharge: 2020-01-07 | Disposition: A | Discharge status: Home / Self Care | Payer: BC Managed Care – PPO | Source: Ambulatory Visit | Attending: General Surgery | Admitting: General Surgery

## 2020-01-07 DIAGNOSIS — Z01812 Encounter for preprocedural laboratory examination: Secondary | ICD-10-CM | POA: Insufficient documentation

## 2020-01-07 DIAGNOSIS — I4510 Unspecified right bundle-branch block: Secondary | ICD-10-CM | POA: Insufficient documentation

## 2020-01-07 DIAGNOSIS — I10 Essential (primary) hypertension: Secondary | ICD-10-CM | POA: Insufficient documentation

## 2020-01-07 DIAGNOSIS — Z01810 Encounter for preprocedural cardiovascular examination: Secondary | ICD-10-CM | POA: Diagnosis present

## 2020-01-12 ENCOUNTER — Other Ambulatory Visit
Admission: RE | Admit: 2020-01-12 | Discharge: 2020-01-12 | Disposition: A | Discharge status: Home / Self Care | Payer: BC Managed Care – PPO | Source: Ambulatory Visit | Attending: General Surgery | Admitting: General Surgery

## 2020-01-12 ENCOUNTER — Other Ambulatory Visit: Payer: Self-pay

## 2020-01-12 DIAGNOSIS — Z01812 Encounter for preprocedural laboratory examination: Secondary | ICD-10-CM | POA: Diagnosis present

## 2020-01-12 DIAGNOSIS — Z20822 Contact with and (suspected) exposure to covid-19: Secondary | ICD-10-CM | POA: Diagnosis not present

## 2020-01-12 LAB — SARS CORONAVIRUS 2 (TAT 6-24 HRS): SARS Coronavirus 2: NEGATIVE

## 2020-01-14 ENCOUNTER — Ambulatory Visit
Admission: RE | Admit: 2020-01-14 | Discharge: 2020-01-14 | Disposition: A | Discharge status: Home / Self Care | Payer: BC Managed Care – PPO | Attending: General Surgery | Admitting: General Surgery

## 2020-01-14 ENCOUNTER — Ambulatory Visit: Payer: BC Managed Care – PPO | Admitting: Urgent Care

## 2020-01-14 ENCOUNTER — Encounter: Payer: Self-pay | Admitting: General Surgery

## 2020-01-14 ENCOUNTER — Other Ambulatory Visit: Payer: Self-pay

## 2020-01-14 ENCOUNTER — Encounter
Admission: RE | Disposition: A | Discharge status: Home / Self Care | Payer: Self-pay | Source: Home / Self Care | Attending: General Surgery

## 2020-01-14 DIAGNOSIS — Z87891 Personal history of nicotine dependence: Secondary | ICD-10-CM | POA: Diagnosis not present

## 2020-01-14 DIAGNOSIS — F329 Major depressive disorder, single episode, unspecified: Secondary | ICD-10-CM | POA: Diagnosis not present

## 2020-01-14 DIAGNOSIS — G4730 Sleep apnea, unspecified: Secondary | ICD-10-CM | POA: Insufficient documentation

## 2020-01-14 DIAGNOSIS — Z79899 Other long term (current) drug therapy: Secondary | ICD-10-CM | POA: Insufficient documentation

## 2020-01-14 DIAGNOSIS — E785 Hyperlipidemia, unspecified: Secondary | ICD-10-CM | POA: Diagnosis not present

## 2020-01-14 DIAGNOSIS — K4091 Unilateral inguinal hernia, without obstruction or gangrene, recurrent: Secondary | ICD-10-CM | POA: Diagnosis present

## 2020-01-14 DIAGNOSIS — I10 Essential (primary) hypertension: Secondary | ICD-10-CM | POA: Insufficient documentation

## 2020-01-14 HISTORY — PX: INGUINAL HERNIA REPAIR: SHX194

## 2020-01-14 SURGERY — REPAIR, HERNIA, INGUINAL, ADULT
Anesthesia: General | Laterality: Left

## 2020-01-14 MED ORDER — PROPOFOL 10 MG/ML IV BOLUS
INTRAVENOUS | Status: AC
  Filled 2020-01-14: qty 20

## 2020-01-14 MED ORDER — ONDANSETRON HCL 4 MG/2ML IJ SOLN
INTRAMUSCULAR | Status: DC | PRN
  Administered 2020-01-14: 4 mg via INTRAVENOUS

## 2020-01-14 MED ORDER — MIDAZOLAM HCL 2 MG/2ML IJ SOLN
INTRAMUSCULAR | Status: DC | PRN
  Administered 2020-01-14: 2 mg via INTRAVENOUS

## 2020-01-14 MED ORDER — ACETAMINOPHEN 10 MG/ML IV SOLN
INTRAVENOUS | Status: DC | PRN
  Administered 2020-01-14: 1000 mg via INTRAVENOUS

## 2020-01-14 MED ORDER — ROCURONIUM BROMIDE 10 MG/ML (PF) SYRINGE
PREFILLED_SYRINGE | INTRAVENOUS | Status: AC
  Filled 2020-01-14: qty 10

## 2020-01-14 MED ORDER — FENTANYL CITRATE (PF) 250 MCG/5ML IJ SOLN
INTRAMUSCULAR | Status: AC
Start: 2020-01-14 — End: ?
  Filled 2020-01-14: qty 5

## 2020-01-14 MED ORDER — ACETAMINOPHEN 10 MG/ML IV SOLN
INTRAVENOUS | Status: AC
  Filled 2020-01-14: qty 100

## 2020-01-14 MED ORDER — CEFAZOLIN SODIUM-DEXTROSE 2-4 GM/100ML-% IV SOLN
2.0000 g | INTRAVENOUS | Status: AC
  Administered 2020-01-14: 2 g via INTRAVENOUS

## 2020-01-14 MED ORDER — FENTANYL CITRATE (PF) 100 MCG/2ML IJ SOLN
INTRAMUSCULAR | Status: DC | PRN
Start: 2020-01-14 — End: 2020-01-14
  Administered 2020-01-14 (×6): 25 ug via INTRAVENOUS

## 2020-01-14 MED ORDER — ONDANSETRON HCL 4 MG/2ML IJ SOLN
INTRAMUSCULAR | Status: AC
  Filled 2020-01-14: qty 2

## 2020-01-14 MED ORDER — CLOPIDOGREL BISULFATE 75 MG PO TABS: 75.0000 mg | ORAL_TABLET | ORAL | 0 refills | Status: DC

## 2020-01-14 MED ORDER — LIDOCAINE HCL (CARDIAC) PF 100 MG/5ML IV SOSY
PREFILLED_SYRINGE | INTRAVENOUS | Status: DC | PRN
  Administered 2020-01-14: 80 mg via INTRAVENOUS

## 2020-01-14 MED ORDER — BUPIVACAINE-EPINEPHRINE (PF) 0.5% -1:200000 IJ SOLN
INTRAMUSCULAR | Status: DC | PRN
  Administered 2020-01-14: 30 mL

## 2020-01-14 MED ORDER — KETOROLAC TROMETHAMINE 30 MG/ML IJ SOLN
INTRAMUSCULAR | Status: AC
  Filled 2020-01-14: qty 1

## 2020-01-14 MED ORDER — ORAL CARE MOUTH RINSE: 15.0000 mL | Freq: Once | OROMUCOSAL | Status: AC

## 2020-01-14 MED ORDER — OXYCODONE HCL 5 MG PO TABS: 5.0000 mg | ORAL_TABLET | Freq: Once | ORAL | Status: AC | PRN

## 2020-01-14 MED ORDER — FENTANYL CITRATE (PF) 100 MCG/2ML IJ SOLN
25.0000 ug | INTRAMUSCULAR | Status: DC | PRN
  Administered 2020-01-14: 25 ug via INTRAVENOUS

## 2020-01-14 MED ORDER — CEFAZOLIN SODIUM-DEXTROSE 2-4 GM/100ML-% IV SOLN
INTRAVENOUS | Status: AC
  Filled 2020-01-14: qty 100

## 2020-01-14 MED ORDER — DEXAMETHASONE SODIUM PHOSPHATE 10 MG/ML IJ SOLN
INTRAMUSCULAR | Status: DC | PRN
  Administered 2020-01-14: 10 mg via INTRAVENOUS

## 2020-01-14 MED ORDER — CHLORHEXIDINE GLUCONATE 0.12 % MT SOLN: 15.0000 mL | Freq: Once | OROMUCOSAL | Status: AC

## 2020-01-14 MED ORDER — FENTANYL CITRATE (PF) 100 MCG/2ML IJ SOLN
INTRAMUSCULAR | Status: AC
  Administered 2020-01-14: 25 ug via INTRAVENOUS
  Filled 2020-01-14: qty 2

## 2020-01-14 MED ORDER — ONDANSETRON HCL 4 MG/2ML IJ SOLN: 4.0000 mg | Freq: Once | INTRAMUSCULAR | Status: DC | PRN

## 2020-01-14 MED ORDER — CHLORHEXIDINE GLUCONATE 0.12 % MT SOLN
OROMUCOSAL | Status: AC
  Administered 2020-01-14: 15 mL via OROMUCOSAL
  Filled 2020-01-14: qty 15

## 2020-01-14 MED ORDER — FAMOTIDINE 20 MG PO TABS
ORAL_TABLET | ORAL | Status: AC
  Administered 2020-01-14: 20 mg
  Filled 2020-01-14: qty 1

## 2020-01-14 MED ORDER — OXYCODONE HCL 5 MG PO TABS
ORAL_TABLET | ORAL | Status: AC
  Administered 2020-01-14: 5 mg via ORAL
  Filled 2020-01-14: qty 1

## 2020-01-14 MED ORDER — PROPOFOL 10 MG/ML IV BOLUS
INTRAVENOUS | Status: DC | PRN
  Administered 2020-01-14: 200 mg via INTRAVENOUS

## 2020-01-14 MED ORDER — OXYCODONE HCL 5 MG/5ML PO SOLN: 5.0000 mg | Freq: Once | ORAL | Status: AC | PRN

## 2020-01-14 MED ORDER — LACTATED RINGERS IV SOLN: INTRAVENOUS | Status: DC

## 2020-01-14 MED ORDER — ACETAMINOPHEN 10 MG/ML IV SOLN: 1000.0000 mg | Freq: Once | INTRAVENOUS | Status: DC | PRN

## 2020-01-14 MED ORDER — HYDROCODONE-ACETAMINOPHEN 5-325 MG PO TABS: 1.0000 | ORAL_TABLET | ORAL | 0 refills | Status: DC | PRN

## 2020-01-14 MED ORDER — LIDOCAINE HCL (PF) 2 % IJ SOLN
INTRAMUSCULAR | Status: AC
  Filled 2020-01-14: qty 5

## 2020-01-14 MED ORDER — MIDAZOLAM HCL 2 MG/2ML IJ SOLN
INTRAMUSCULAR | Status: AC
  Filled 2020-01-14: qty 2

## 2020-01-14 MED ORDER — DEXAMETHASONE SODIUM PHOSPHATE 10 MG/ML IJ SOLN
INTRAMUSCULAR | Status: AC
  Filled 2020-01-14: qty 1

## 2020-01-14 SURGICAL SUPPLY — 35 items
APL PRP STRL LF DISP 70% ISPRP (MISCELLANEOUS) ×1
BLADE SURG 15 STRL SS SAFETY (BLADE) ×6 IMPLANT
CANISTER SUCT 1200ML W/VALVE (MISCELLANEOUS) ×3 IMPLANT
CHLORAPREP W/TINT 26 (MISCELLANEOUS) ×3 IMPLANT
CLOSURE WOUND 1/2 X4 (GAUZE/BANDAGES/DRESSINGS) ×1
COVER WAND RF STERILE (DRAPES) ×3 IMPLANT
DECANTER SPIKE VIAL GLASS SM (MISCELLANEOUS) ×3 IMPLANT
DRAIN PENROSE 1/4X12 LTX STRL (WOUND CARE) ×3 IMPLANT
DRAPE LAPAROTOMY 100X77 ABD (DRAPES) ×3 IMPLANT
DRSG TEGADERM 4X4.75 (GAUZE/BANDAGES/DRESSINGS) ×3 IMPLANT
DRSG TELFA 4X3 1S NADH ST (GAUZE/BANDAGES/DRESSINGS) ×3 IMPLANT
ELECT REM PT RETURN 9FT ADLT (ELECTROSURGICAL) ×3
ELECTRODE REM PT RTRN 9FT ADLT (ELECTROSURGICAL) ×1 IMPLANT
GLOVE BIO SURGEON STRL SZ7.5 (GLOVE) ×3 IMPLANT
GLOVE INDICATOR 8.0 STRL GRN (GLOVE) ×3 IMPLANT
GOWN STRL REUS W/ TWL LRG LVL3 (GOWN DISPOSABLE) ×2 IMPLANT
GOWN STRL REUS W/TWL LRG LVL3 (GOWN DISPOSABLE) ×6
KIT TURNOVER KIT A (KITS) ×3 IMPLANT
LABEL OR SOLS (LABEL) ×3 IMPLANT
MESH MARLEX PLUG MEDIUM (Mesh General) ×3 IMPLANT
NEEDLE HYPO 22GX1.5 SAFETY (NEEDLE) ×6 IMPLANT
PACK BASIN MINOR (MISCELLANEOUS) ×3 IMPLANT
STRIP CLOSURE SKIN 1/2X4 (GAUZE/BANDAGES/DRESSINGS) ×2 IMPLANT
SUT PDS AB 0 CT1 27 (SUTURE) ×3 IMPLANT
SUT SURGILON 0 BLK (SUTURE) ×6 IMPLANT
SUT VIC AB 2-0 SH 27 (SUTURE) ×3
SUT VIC AB 2-0 SH 27XBRD (SUTURE) ×1 IMPLANT
SUT VIC AB 3-0 54X BRD REEL (SUTURE) ×1 IMPLANT
SUT VIC AB 3-0 BRD 54 (SUTURE) ×3
SUT VIC AB 3-0 SH 27 (SUTURE) ×3
SUT VIC AB 3-0 SH 27X BRD (SUTURE) ×1 IMPLANT
SUT VIC AB 4-0 FS2 27 (SUTURE) ×3 IMPLANT
SWABSTK COMLB BENZOIN TINCTURE (MISCELLANEOUS) ×3 IMPLANT
SYR 10ML LL (SYRINGE) ×3 IMPLANT
SYR 3ML LL SCALE MARK (SYRINGE) ×3 IMPLANT

## 2020-01-14 NOTE — Anesthesia Preprocedure Evaluation (Signed)
Anesthesia Evaluation  Patient identified by MRN, date of birth, ID band Patient awake    Reviewed: Allergy & Precautions, NPO status , Patient's Chart, lab work & pertinent test results, reviewed documented beta blocker date and time   History of Anesthesia Complications Negative for: history of anesthetic complications  Airway Mallampati: II  TM Distance: >3 FB Neck ROM: Full    Dental  (+) Partial Upper, Partial Lower, Dental Advisory Given   Pulmonary sleep apnea , neg COPD, Patient abstained from smoking.Not current smoker, former smoker,  High risk for OSA, patient says he snores heavily   Pulmonary exam normal breath sounds clear to auscultation       Cardiovascular Exercise Tolerance: Good METShypertension, (-) angina(-) CAD, (-) Past MI and (-) DOE (-) dysrhythmias  Rhythm:Regular Rate:Normal - Systolic murmurs  HLD   Neuro/Psych  Headaches, PSYCHIATRIC DISORDERS Depression  Neuromuscular disease (Sciatica)    GI/Hepatic hiatal hernia, PUD, neg GERD  ,(+)     (-) substance abuse  ,  Esophageal stricture   Endo/Other  neg diabetes  Renal/GU negative Renal ROS     Musculoskeletal  (+) Arthritis ,   Abdominal (+) + obese (BMI 33),   Peds  Hematology  (+) anemia ,   Anesthesia Other Findings Past Medical History: No date: Depression No date: GERD (gastroesophageal reflux disease) No date: H/O ulcer disease No date: Headache     Comment:  MIGRAINES No date: Hypertension No date: SBO (small bowel obstruction) (HCC) No date: Wears dentures     Comment:  full upper  Reproductive/Obstetrics                             Anesthesia Physical  Anesthesia Plan  ASA: II  Anesthesia Plan: General   Post-op Pain Management:    Induction: Intravenous  PONV Risk Score and Plan: 2 and Propofol infusion, TIVA and Treatment may vary due to age or medical condition  Airway  Management Planned: LMA  Additional Equipment: None  Intra-op Plan:   Post-operative Plan: Extubation in OR  Informed Consent: I have reviewed the patients History and Physical, chart, labs and discussed the procedure including the risks, benefits and alternatives for the proposed anesthesia with the patient or authorized representative who has indicated his/her understanding and acceptance.     Dental advisory given  Plan Discussed with: CRNA and Anesthesiologist  Anesthesia Plan Comments: (Discussed risks of anesthesia with patient, including PONV, sore throat, lip/dental damage. Rare risks discussed as well, such as cardiorespiratory and neurological sequelae. Patient understands.)        Anesthesia Quick Evaluation

## 2020-01-14 NOTE — Discharge Instructions (Signed)

## 2020-01-14 NOTE — Anesthesia Procedure Notes (Signed)
Procedure Name: LMA Insertion Date/Time: 01/14/2020 12:05 PM Performed by: Eben Burow, CRNA Pre-anesthesia Checklist: Patient identified, Emergency Drugs available, Suction available and Patient being monitored Patient Re-evaluated:Patient Re-evaluated prior to induction Oxygen Delivery Method: Circle system utilized Preoxygenation: Pre-oxygenation with 100% oxygen Induction Type: IV induction Ventilation: Mask ventilation without difficulty and Oral airway inserted - appropriate to patient size LMA: LMA inserted LMA Size: 4.5 Number of attempts: 1 Placement Confirmation: positive ETCO2 Tube secured with: Tape Dental Injury: Teeth and Oropharynx as per pre-operative assessment

## 2020-01-14 NOTE — H&P (Signed)
Progress Notes - documented in this encounter Alan Trevino, Alan Boot, MD - 11/04/2019 11:00 AM EDT Formatting of this note is different from the original. Images from the original note were not included. Subjective:   Patient ID: Alan Trevino is a 59 y.o. male.  HPI  The following portions of the patient's history were reviewed and updated as appropriate.  This an established patient is here today for: office visit. Patient is here today to be evaluated for a recurrent left inguinal hernia repair. He was referred by Dr. Otilio Miu. He reports he noticed it a couple of years ago. Patient reports it pops in and out. He reports for the most part the hernia does not bother him. If he does strenuous activity he reports he has mild pain.   The patient had previously undergone repair prior to 2013. Records have been requested for review. I suspect he had an indirect hernia repaired with a Ultra pro mesh.  Review of Systems  Constitutional: Negative for fever.  Respiratory: Negative for cough.   Chief Complaint  Patient presents with   Inguinal Hernia  left    BP 112/78   Pulse 73   Temp 36.7 C (98 F)   Ht 160 cm (5\' 3" )   Wt 83.5 kg (184 lb)   SpO2 97%   BMI 32.59 kg/m   Past Medical History:  Diagnosis Date   Anemia   DDD (degenerative disc disease), lumbosacral   Depression   Gastric ulcer 01/17/2017   GERD (gastroesophageal reflux disease)   Hiatal hernia   Hyperlipidemia   Hypertension   SBO (small bowel obstruction) (CMS-HCC)    Past Surgical History:  Procedure Laterality Date   EPIGASTRIC HERNIA REPAIR   HERNIA REPAIR   polypectomy 12/29/2015  Tubular adenoma x3, [name], MD   polypectomy 12/29/2015   SHOULDER FUSION   shoulder surgery 09/2009    Social History   Socioeconomic History   Marital status: Single  Spouse name: Not on file   Number of children: Not on file   Years of education: Not on file   Highest education  level: Not on file  Occupational History   Not on file  Tobacco Use   Smoking status: Former Smoker   Smokeless tobacco: Current User  Types: Nurse, children's Use: Former  Substance and Sexual Activity   Alcohol use: Yes   Drug use: Never   Sexual activity: Defer  Other Topics Concern   Not on file  Social History Narrative   Not on file   Social Determinants of Health   Financial Resource Strain:   Difficulty of Paying Living Expenses:  Food Insecurity:   Worried About Charity fundraiser in the Last Year:   Arboriculturist in the Last Year:  Transportation Needs:   Film/video editor (Medical):   Lack of Transportation (Non-Medical):    No Known Allergies  Current Outpatient Medications  Medication Sig Dispense Refill   acetaminophen (TYLENOL) 500 MG tablet Take by mouth   amLODIPine-atorvastatin (CADUET) 5-20 mg tablet Take 1 tablet by mouth once daily   buPROPion (WELLBUTRIN XL) 300 MG XL tablet Take 300 mg by mouth once daily   busPIRone (BUSPAR) 15 MG tablet Take 15 mg by mouth 2 (two) times daily   cyclobenzaprine (FLEXERIL) 5 MG tablet Take 5 mg by mouth 3 (three) times daily as needed for Muscle spasms   methylphenidate HCl (RITALIN) 20 MG tablet Take 20 mg  by mouth 2 (two) times daily   SUMAtriptan (IMITREX) 100 MG tablet Take 0.5 to 1 tab at headache onset, can repeat once if needed in 2 hours. No more than 2 tabs in 24 hours 20 tablet 3   venlafaxine (EFFEXOR) 75 MG tablet TAKE 1 TABLET BY MOUTH TWICE A DAY 60 tablet 5   No current facility-administered medications for this visit.   Family History  Problem Relation Age of Onset   Cancer Mother   Ovarian cancer Mother   Coronary Artery Disease (Blocked arteries around heart) Father   Breast cancer Neg Hx   Colon cancer Neg Hx   Labs and Radiology:   He had the abdomen and pelvis in 2011 showed a sizable gastric ulcer. His most recent upper endoscopy in 2018  showed gastritis without frank ulcer disease.  Colonoscopy completed September 2017 showed 3 small tubular adenomas removed by [name], MD follow-up typically would be in 3-5 years, follow-up 2022 at the latest.  Objective:  Physical Exam Constitutional:  Appearance: Normal appearance.  Cardiovascular:  Rate and Rhythm: Normal rate and regular rhythm.  Pulses: Normal pulses.  Heart sounds: Normal heart sounds.  Pulmonary:  Effort: Pulmonary effort is normal.  Breath sounds: Normal breath sounds.  Abdominal:  Hernia: A hernia is present. Hernia is present in the left inguinal area.  Genitourinary:  Neurological:  Mental Status: He is alert and oriented to person, place, and time.  Psychiatric:  Behavior: Behavior normal.    Assessment:   Symptomatic, recurrent right inguinal hernia.  Plan:   Indications for elective repair reviewed.  Inguinal hernia repair to be arranged.   Patient reports he is scheduled for his second Declo vaccine on 11-13-19.  Entered by Ledell Noss, CMA, acting as a scribe for Dr. Hervey Ard, MD.   The documentation recorded by the scribe accurately reflects the service I personally performed and the decisions made by me.   Robert Bellow, MD FACS  November 17, 2019:  The operative report from his February 26, 2005 left inguinal hernia repair was reviewed. A direct hernia was evident. He had a next ultra pro mesh placed at that time. The procedure was completed under local anesthesia with sedation.    No change in clinical history or exam. For repair of recurrent left inguinal hernia.   Hervey Ard, MD, FACS

## 2020-01-14 NOTE — Op Note (Signed)
Preoperative diagnosis: Recurrent left inguinal hernia.  Postoperative diagnosis:, Same direct.  Operative procedure Repair of left direct inguinal hernia with medium PerFix plug and patch.  Operating surgeon: Hervey Ard, MD.  Anesthesia: General by LMA, Marcaine 0.5% with 1: 200,000 units of epinephrine, 30 cc, Toradol 30 mg.  Estimated blood loss: Less than 3 cc.  Clinical note: This 59 year old male underwent a open left direct inguinal hernia repair with a Ultra pro mesh in 2006.  He is recently discovered of recurrence and is mated for elective repair.  Operative note: With the patient under adequate general anesthesia the abdomen was cleansed with ChloraPrep and draped.  SCD stockings were in place.  Ancef was administered.  Field block anesthesia was placed for postoperative analgesia.  A 6 cm skin line incision along the anticipated course the inguinal canal was made and carried down through skin subtendinous tissue with hemostasis achieved with electrocautery.  The external oblique was opened and dense scarring on its undersurface was appreciated.  This was sharply dissected and ~the direct recurrence was noted superior and medial to the cord structures.  This was dissected free circumferentially until a mass about the size of 3 x 6 cm had been freed.  This was then returned to the preperitoneal space.  A medium PerFix plug was placed and anchored into position between the inferior edge of the transverse abdominis aponeurosis in the iliopubic tract with interrupted 0 Surgilon sutures x2.  The onlay was then tacked to the rectus fascia medially and then across the exposed aspect of the lower portion of the external Bleich near the inguinal ligament.  The superior aspect was anchored to the transverse abdominis aponeurosis.  No lateral sutures were placed.  Of note, the cord is not mobilized during the procedure.  The external oblique was closed in the direction of its fibers after  instilling Toradol with a running 2-0 Vicryl suture.  Scarpa's fascia was closed with a running 3-0 Vicryl suture and the skin closed with a running 4-0 Vicryl subcuticular suture.  Benzoin, Steri-Strips, Telfa Tegaderm dressing was then applied.  The patient tolerated the procedure well and was taken recovery in stable condition.

## 2020-01-14 NOTE — Anesthesia Postprocedure Evaluation (Signed)
Anesthesia Post Note  Patient: Alan Trevino  Procedure(s) Performed: HERNIA REPAIR INGUINAL ADULT (Left )  Patient location during evaluation: PACU Anesthesia Type: General Level of consciousness: awake and alert Pain management: pain level controlled Vital Signs Assessment: post-procedure vital signs reviewed and stable Respiratory status: spontaneous breathing, nonlabored ventilation, respiratory function stable and patient connected to nasal cannula oxygen Cardiovascular status: blood pressure returned to baseline and stable Postop Assessment: no apparent nausea or vomiting Anesthetic complications: no   No complications documented.   Last Vitals:  Vitals:   01/14/20 1400 01/14/20 1415  BP: (!) 166/89 (!) 162/86  Pulse: 74 76  Resp: 13 12  Temp:    SpO2: 95% 95%    Last Pain:  Vitals:   01/14/20 1428  TempSrc:   PainSc: 3                  Arita Miss

## 2020-01-14 NOTE — Transfer of Care (Signed)
Immediate Anesthesia Transfer of Care Note  Patient: Alan Trevino  Procedure(s) Performed: HERNIA REPAIR INGUINAL ADULT (Left )  Patient Location: PACU  Anesthesia Type:General  Level of Consciousness: drowsy  Airway & Oxygen Therapy: Patient Spontanous Breathing and Patient connected to face mask oxygen  Post-op Assessment: Report given to RN and Post -op Vital signs reviewed and stable  Post vital signs: Reviewed and stable  Last Vitals:  Vitals Value Taken Time  BP 123/81 01/14/20 1313  Temp    Pulse 70 01/14/20 1314  Resp 10 01/14/20 1314  SpO2 94 % 01/14/20 1314  Vitals shown include unvalidated device data.  Last Pain:  Vitals:   01/14/20 1115  TempSrc: Tympanic  PainSc: 0-No pain         Complications: No complications documented.

## 2020-01-14 NOTE — OR Nursing (Addendum)
Per Dr. Bary Castilla, secure-chat, ok to d/c pt to home without his visit to see patient in postop.

## 2020-01-17 ENCOUNTER — Encounter: Payer: Self-pay | Admitting: General Surgery

## 2020-02-07 ENCOUNTER — Other Ambulatory Visit: Payer: Self-pay | Admitting: Family Medicine

## 2020-02-07 DIAGNOSIS — E782 Mixed hyperlipidemia: Secondary | ICD-10-CM

## 2020-02-07 DIAGNOSIS — M519 Unspecified thoracic, thoracolumbar and lumbosacral intervertebral disc disorder: Secondary | ICD-10-CM

## 2020-02-07 NOTE — Telephone Encounter (Signed)
Requested medication (s) are due for refill today -yes  Requested medication (s) are on the active medication list -yes  Future visit scheduled -no  Last refill: 11/08/19  Notes to clinic: Request for non delegated rx  Requested Prescriptions  Pending Prescriptions Disp Refills   cyclobenzaprine (FLEXERIL) 5 MG tablet [Pharmacy Med Name: CYCLOBENZAPRINE 5 MG TABLET] 180 tablet 1    Sig: TAKE 2 TABLETS (10 MG TOTAL) BY MOUTH AT BEDTIME.      Not Delegated - Analgesics:  Muscle Relaxants Failed - 02/07/2020  1:20 AM      Failed - This refill cannot be delegated      Passed - Valid encounter within last 6 months    Recent Outpatient Visits           5 months ago Annual physical exam   [facility] Juline Patch, MD   5 months ago Essential hypertension   [place], Deanna C, MD   1 year ago Essential hypertension   [place], Deanna C, MD   1 year ago Essential hypertension   [facility] Juline Patch, MD   2 years ago Primary exertional headache   [place], MD               Signed Prescriptions Disp Refills   atorvastatin (LIPITOR) 10 MG tablet 90 tablet 1    Sig: TAKE 1 TABLET BY MOUTH EVERY DAY      Cardiovascular:  Antilipid - Statins Failed - 02/07/2020  1:20 AM      Failed - LDL in normal range and within 360 days    LDL Chol Calc (NIH)  Date Value Ref Range Status  09/10/2019 105 (H) 0 - 99 mg/dL Final          Passed - Total Cholesterol in normal range and within 360 days    Cholesterol, Total  Date Value Ref Range Status  09/10/2019 184 100 - 199 mg/dL Final          Passed - HDL in normal range and within 360 days    HDL  Date Value Ref Range Status  09/10/2019 56 >39 mg/dL Final          Passed - Triglycerides in normal range and within 360 days    Triglycerides  Date Value Ref Range Status  09/10/2019 128 0 - 149 mg/dL Final          Passed -  Patient is not pregnant      Passed - Valid encounter within last 12 months    Recent Outpatient Visits           5 months ago Annual physical exam   [facility] Juline Patch, MD   5 months ago Essential hypertension   [facility] Juline Patch, MD   1 year ago Essential hypertension   [facility] Juline Patch, MD   1 year ago Essential hypertension   [place], Deanna C, MD   2 years ago Primary exertional headache   [place], MD                  Requested Prescriptions  Pending Prescriptions Disp Refills   cyclobenzaprine (FLEXERIL) 5 MG tablet [Pharmacy Med Name: CYCLOBENZAPRINE 5 MG TABLET] 180 tablet 1    Sig: TAKE 2 TABLETS (10 MG TOTAL) BY MOUTH AT BEDTIME.  Not Delegated - Analgesics:  Muscle Relaxants Failed - 02/07/2020  1:20 AM      Failed - This refill cannot be delegated      Passed - Valid encounter within last 6 months    Recent Outpatient Visits           5 months ago Annual physical exam   [facility] Juline Patch, MD   5 months ago Essential hypertension   [place], Deanna C, MD   1 year ago Essential hypertension   [place], Deanna C, MD   1 year ago Essential hypertension   [facility] Juline Patch, MD   2 years ago Primary exertional headache   [place], MD               Signed Prescriptions Disp Refills   atorvastatin (LIPITOR) 10 MG tablet 90 tablet 1    Sig: TAKE 1 TABLET BY MOUTH EVERY DAY      Cardiovascular:  Antilipid - Statins Failed - 02/07/2020  1:20 AM      Failed - LDL in normal range and within 360 days    LDL Chol Calc (NIH)  Date Value Ref Range Status  09/10/2019 105 (H) 0 - 99 mg/dL Final          Passed - Total Cholesterol in normal range and within 360 days    Cholesterol, Total  Date Value Ref Range Status  09/10/2019 184 100  - 199 mg/dL Final          Passed - HDL in normal range and within 360 days    HDL  Date Value Ref Range Status  09/10/2019 56 >39 mg/dL Final          Passed - Triglycerides in normal range and within 360 days    Triglycerides  Date Value Ref Range Status  09/10/2019 128 0 - 149 mg/dL Final          Passed - Patient is not pregnant      Passed - Valid encounter within last 12 months    Recent Outpatient Visits           5 months ago Annual physical exam   [facility] Juline Patch, MD   5 months ago Essential hypertension   [facility] Juline Patch, MD   1 year ago Essential hypertension   [facility] Juline Patch, MD   1 year ago Essential hypertension   [facility] Juline Patch, MD   2 years ago Primary exertional headache   [facility] Juline Patch, MD

## 2020-02-27 ENCOUNTER — Other Ambulatory Visit: Payer: Self-pay | Admitting: Family Medicine

## 2020-02-27 DIAGNOSIS — I10 Essential (primary) hypertension: Secondary | ICD-10-CM

## 2020-02-27 NOTE — Telephone Encounter (Signed)
Requested Prescriptions  Pending Prescriptions Disp Refills   amLODipine-benazepril (LOTREL) 10-20 MG capsule [Pharmacy Med Name: AMLODIPINE-BENAZEPRIL 10-20 MG] 90 capsule 0    Sig: TAKE 1 CAPSULE BY MOUTH EVERY DAY     Cardiovascular: CCB + ACEI Combos Failed - 02/27/2020  9:23 AM      Failed - Last BP in normal range    BP Readings from Last 1 Encounters:  01/14/20 (!) 158/83         Passed - Cr in normal range and within 180 days    Creatinine  Date Value Ref Range Status  04/16/2013 1.12 0.60 - 1.30 mg/dL Final   Creatinine, Ser  Date Value Ref Range Status  09/10/2019 1.26 0.76 - 1.27 mg/dL Final         Passed - K in normal range and within 180 days    Potassium  Date Value Ref Range Status  09/10/2019 4.9 3.5 - 5.2 mmol/L Final  04/16/2013 4.0 3.5 - 5.1 mmol/L Final         Passed - Patient is not pregnant      Passed - Valid encounter within last 6 months    Recent Outpatient Visits          5 months ago Annual physical exam   [place], Deanna C, MD   6 months ago Essential hypertension   [place], Deanna C, MD   1 year ago Essential hypertension   [place], Deanna C, MD   1 year ago Essential hypertension   [place], Deanna C, MD   2 years ago Primary exertional headache   [place], MD      Future Appointments            In 1 week Juline Patch, MD [facility], [facility]

## 2020-03-09 ENCOUNTER — Ambulatory Visit (INDEPENDENT_AMBULATORY_CARE_PROVIDER_SITE_OTHER): Payer: BC Managed Care – PPO | Admitting: Family Medicine

## 2020-03-09 ENCOUNTER — Other Ambulatory Visit: Payer: Self-pay

## 2020-03-09 ENCOUNTER — Encounter: Payer: Self-pay | Admitting: Family Medicine

## 2020-03-09 VITALS — BP 122/78 | HR 86 | Ht 62.0 in | Wt 187.0 lb

## 2020-03-09 DIAGNOSIS — I10 Essential (primary) hypertension: Secondary | ICD-10-CM

## 2020-03-09 DIAGNOSIS — E782 Mixed hyperlipidemia: Secondary | ICD-10-CM | POA: Diagnosis not present

## 2020-03-09 DIAGNOSIS — M519 Unspecified thoracic, thoracolumbar and lumbosacral intervertebral disc disorder: Secondary | ICD-10-CM

## 2020-03-09 DIAGNOSIS — Z23 Encounter for immunization: Secondary | ICD-10-CM

## 2020-03-09 MED ORDER — CYCLOBENZAPRINE HCL 5 MG PO TABS: 10.0000 mg | ORAL_TABLET | Freq: Every day | ORAL | 1 refills | Status: DC

## 2020-03-09 MED ORDER — AMLODIPINE BESY-BENAZEPRIL HCL 10-20 MG PO CAPS: ORAL_CAPSULE | ORAL | 1 refills | Status: DC

## 2020-03-09 MED ORDER — ATORVASTATIN CALCIUM 10 MG PO TABS: ORAL_TABLET | ORAL | 1 refills | Status: DC

## 2020-03-09 NOTE — Progress Notes (Signed)
Date:  03/09/2020   Name:  Alan Trevino   DOB:  12/21/1960   MRN:  [id number]   Chief Complaint: Hypertension, Hyperlipidemia, Spasms (refill cyclobenzaprine), and Flu Vaccine  Hypertension This is a chronic problem. The current episode started more than 1 year ago. The problem has been gradually improving since onset. The problem is controlled. Pertinent negatives include no anxiety, blurred vision, chest pain, headaches, malaise/fatigue, neck pain, orthopnea, palpitations, peripheral edema, PND, shortness of breath or sweats. There are no associated agents to hypertension. There are no known risk factors for coronary artery disease. Past treatments include ACE inhibitors and calcium channel blockers. The current treatment provides moderate improvement. There are no compliance problems.  There is no history of angina, kidney disease, CAD/MI, CVA, heart failure, left ventricular hypertrophy, PVD or retinopathy. There is no history of chronic renal disease, a hypertension causing med or renovascular disease.  Hyperlipidemia This is a chronic problem. The current episode started more than 1 year ago. The problem is controlled. Recent lipid tests were reviewed and are normal. He has no history of chronic renal disease, diabetes, hypothyroidism, liver disease, obesity or nephrotic syndrome. Pertinent negatives include no chest pain, leg pain, myalgias or shortness of breath. Current antihyperlipidemic treatment includes statins. The current treatment provides moderate improvement of lipids. There are no compliance problems.  Risk factors for coronary artery disease include dyslipidemia, hypertension and male sex.  Back Pain This is a chronic problem. The current episode started more than 1 year ago. The problem occurs daily. The problem has been waxing and waning since onset. The pain is present in the lumbar spine. The quality of the pain is described as aching. The pain is moderate. Pertinent  negatives include no abdominal pain, bladder incontinence, bowel incontinence, chest pain, dysuria, fever, headaches, leg pain, numbness, paresis, paresthesias, pelvic pain, perianal numbness, tingling, weakness or weight loss. He has tried muscle relaxant (muscle relaxants) for the symptoms. The treatment provided moderate relief.    Lab Results  Component Value Date   CREATININE 1.26 09/10/2019   BUN 10 09/10/2019   NA 142 09/10/2019   K 4.9 09/10/2019   CL 105 09/10/2019   CO2 21 09/10/2019   Lab Results  Component Value Date   CHOL 184 09/10/2019   HDL 56 09/10/2019   LDLCALC 105 (H) 09/10/2019   TRIG 128 09/10/2019   CHOLHDL 5.6 (H) 02/01/2019   No results found for: TSH No results found for: HGBA1C Lab Results  Component Value Date   WBC 12.4 (H) 11/29/2016   HGB 11.6 (L) 12/09/2016   HCT 33.2 (L) 11/29/2016   MCV 93.8 11/29/2016   PLT 196 11/29/2016   Lab Results  Component Value Date   ALT 22 11/28/2016   AST 27 11/28/2016   ALKPHOS 44 11/28/2016   BILITOT 1.1 11/28/2016     Review of Systems  Constitutional: Negative for chills, fever, malaise/fatigue and weight loss.  HENT: Negative for drooling, ear discharge, ear pain and sore throat.   Eyes: Negative for blurred vision.  Respiratory: Negative for cough, shortness of breath and wheezing.   Cardiovascular: Negative for chest pain, palpitations, orthopnea, leg swelling and PND.  Gastrointestinal: Negative for abdominal pain, blood in stool, bowel incontinence, constipation, diarrhea and nausea.  Endocrine: Negative for polydipsia.  Genitourinary: Negative for bladder incontinence, dysuria, frequency, hematuria, pelvic pain and urgency.  Musculoskeletal: Negative for back pain, myalgias and neck pain.  Skin: Negative for rash.  Allergic/Immunologic: Negative for environmental  allergies.  Neurological: Negative for dizziness, tingling, weakness, numbness, headaches and paresthesias.  Hematological: Does not  bruise/bleed easily.  Psychiatric/Behavioral: Negative for suicidal ideas. The patient is not nervous/anxious.     Patient Active Problem List   Diagnosis Date Noted   Personal history of colonic polyps    Acute peptic ulcer without hemorrhage and perforation    Hiatal hernia    Esophagogastric ulcer    Stricture and stenosis of esophagus    GIB (gastrointestinal bleeding) 11/30/2016   Hematemesis without nausea    Acute esophagogastric ulcer    Upper GI bleed 11/28/2016   Acute recurrent maxillary sinusitis 09/07/2016   Special screening for malignant neoplasms, colon    Benign neoplasm of ascending colon    Benign neoplasm of transverse colon    Benign neoplasm of descending colon    Rectal polyp    Lumbar disc disease 11/03/2015   Iron deficiency anemia 08/25/2014   Acute gastrointestinal bleeding 08/25/2014   Acute back pain with sciatica 08/25/2014   Routine general medical examination at a health care facility 08/25/2014   DDD (degenerative disc disease), lumbosacral 08/25/2014   Recurrent major depressive episodes ([place]) 08/25/2014   Essential (primary) hypertension 08/25/2014   HLD (hyperlipidemia) 08/25/2014    No Known Allergies  Past Surgical History:  Procedure Laterality Date   COLONOSCOPY WITH PROPOFOL N/A 12/29/2015   Procedure: COLONOSCOPY WITH PROPOFOL;  Surgeon: [name], MD;  Location: [place];  Service: Endoscopy;  Laterality: N/A;   COLONOSCOPY WITH PROPOFOL N/A 11/29/2019   Procedure: COLONOSCOPY WITH PROPOFOL;  Surgeon: [name], MD;  Location: [place];  Service: Endoscopy;  Laterality: N/A;  priority 4   ESOPHAGEAL DILATION     stretching   ESOPHAGEAL DILATION  01/17/2017   Procedure: ESOPHAGEAL DILATION;  Surgeon: [name], MD;  Location: [place];  Service: Gastroenterology;;   ESOPHAGOGASTRODUODENOSCOPY (EGD) WITH PROPOFOL N/A 11/29/2016   Procedure: ESOPHAGOGASTRODUODENOSCOPY  (EGD) WITH PROPOFOL;  Surgeon: [name], MD;  Location: [facility];  Service: Endoscopy;  Laterality: N/A;   ESOPHAGOGASTRODUODENOSCOPY (EGD) WITH PROPOFOL N/A 01/17/2017   Procedure: ESOPHAGOGASTRODUODENOSCOPY (EGD) WITH PROPOFOL;  Surgeon: [name], MD;  Location: [place];  Service: Gastroenterology;  Laterality: N/A;   HERNIA REPAIR     INGUINAL HERNIA REPAIR Left 01/14/2020   Procedure: HERNIA REPAIR INGUINAL ADULT;  Surgeon: Robert Bellow, MD;  Location: [facility];  Service: General;  Laterality: Left;   POLYPECTOMY N/A 12/29/2015   Procedure: POLYPECTOMY;  Surgeon: [name], MD;  Location: [place];  Service: Endoscopy;  Laterality: N/A;   POLYPECTOMY  01/17/2017   Procedure: POLYPECTOMY;  Surgeon: [name], MD;  Location: [place];  Service: Gastroenterology;;   POLYPECTOMY N/A 11/29/2019   Procedure: POLYPECTOMY;  Surgeon: [name], MD;  Location: [place];  Service: Endoscopy;  Laterality: N/A;   SHOULDER SURGERY  09/2009    Social History   Tobacco Use   Smoking status: Former Smoker    Packs/day: 2.00    Years: 30.00    Pack years: 60.00    Types: Cigarettes    Quit date: 2000    Years since quitting: 21.8   Smokeless tobacco: Current User    Types: Chew  Vaping Use   Vaping Use: Never used  Substance Use Topics   Alcohol use: Yes    Alcohol/week: 12.0 standard drinks    Types: 12 Cans of beer per week    Comment: OCC BEER  Drug use: No     Medication list has been reviewed and updated.  Current Meds  Medication Sig   acetaminophen (TYLENOL) 500 MG tablet Take 1,000 mg by mouth every 6 (six) hours as needed for moderate pain.    amLODipine-benazepril (LOTREL) 10-20 MG capsule TAKE 1 CAPSULE BY MOUTH EVERY DAY   atorvastatin (LIPITOR) 10 MG tablet TAKE 1 TABLET BY MOUTH EVERY DAY   buPROPion (WELLBUTRIN XL) 300 MG 24 hr tablet Take 300 mg by mouth daily.    busPIRone (BUSPAR) 30  MG tablet Take 30 mg by mouth 2 (two) times daily.    clopidogrel (PLAVIX) 75 MG tablet Take 1 tablet (75 mg total) by mouth every Monday, Wednesday, and Friday.   cyclobenzaprine (FLEXERIL) 5 MG tablet TAKE 2 TABLETS (10 MG TOTAL) BY MOUTH AT BEDTIME.   methylphenidate (RITALIN) 20 MG tablet Take 20 mg by mouth 2 (two) times daily with breakfast and lunch.    SUMAtriptan (IMITREX) 100 MG tablet Take 100 mg by mouth every 2 (two) hours as needed for migraine.    venlafaxine (EFFEXOR) 75 MG tablet Take 1 tablet by mouth 2 (two) times daily.    [DISCONTINUED] HYDROcodone-acetaminophen (NORCO/VICODIN) 5-325 MG tablet Take 1 tablet by mouth every 4 (four) hours as needed for moderate pain.    PHQ 2/9 Scores 03/09/2020 09/10/2019 08/13/2019 02/01/2019  PHQ - 2 Score 0 0 0 0  PHQ- 9 Score 0 0 0 0    GAD 7 : Generalized Anxiety Score 03/09/2020 09/10/2019 08/13/2019  Nervous, Anxious, on Edge 0 0 0  Control/stop worrying 0 0 0  Worry too much - different things 0 0 0  Trouble relaxing 0 0 0  Restless 0 0 0  Easily annoyed or irritable 0 1 1  Afraid - awful might happen 0 0 0  Total GAD 7 Score 0 1 1  Anxiety Difficulty - Not difficult at all -    BP Readings from Last 3 Encounters:  03/09/20 122/78  01/14/20 (!) 158/83  11/29/19 (!) 155/95    Physical Exam Vitals and nursing note reviewed.  HENT:     Head: Normocephalic.     Right Ear: Tympanic membrane and external ear normal.     Left Ear: Tympanic membrane and external ear normal.     Nose: Nose normal.  Eyes:     General: No scleral icterus.       Right eye: No discharge.        Left eye: No discharge.     Conjunctiva/sclera: Conjunctivae normal.     Pupils: Pupils are equal, round, and reactive to light.  Neck:     Thyroid: No thyromegaly.     Vascular: No JVD.     Trachea: No tracheal deviation.  Cardiovascular:     Rate and Rhythm: Normal rate and regular rhythm.     Chest Wall: PMI is not displaced.     Pulses:  Normal pulses.          Carotid pulses are 2+ on the right side and 2+ on the left side.      Radial pulses are 2+ on the right side and 2+ on the left side.       Femoral pulses are 2+ on the right side and 2+ on the left side.      Popliteal pulses are 2+ on the right side and 2+ on the left side.       Dorsalis pedis pulses are 2+ on  the right side and 2+ on the left side.       Posterior tibial pulses are 2+ on the right side and 2+ on the left side.     Heart sounds: Normal heart sounds, S1 normal and S2 normal. No murmur heard.  No systolic murmur is present.  No diastolic murmur is present.  No friction rub. No gallop. No S3 or S4 sounds.   Pulmonary:     Effort: No respiratory distress.     Breath sounds: Normal breath sounds. No wheezing or rales.  Abdominal:     General: Bowel sounds are normal.     Palpations: Abdomen is soft. There is no mass.     Tenderness: There is no abdominal tenderness. There is no guarding or rebound.  Musculoskeletal:        General: No tenderness. Normal range of motion.     Cervical back: Normal range of motion and neck supple.     Right lower leg: No edema.     Left lower leg: No edema.  Lymphadenopathy:     Cervical: No cervical adenopathy.  Skin:    General: Skin is warm.     Findings: No rash.  Neurological:     Mental Status: He is alert and oriented to person, place, and time.     Cranial Nerves: No cranial nerve deficit.     Deep Tendon Reflexes: Reflexes are normal and symmetric.     Wt Readings from Last 3 Encounters:  03/09/20 187 lb (84.8 kg)  11/29/19 181 lb (82.1 kg)  10/15/19 184 lb (83.5 kg)    BP 122/78    Pulse 86    Ht 5\' 2"  (1.575 m)    Wt 187 lb (84.8 kg)    BMI 34.20 kg/m   Assessment and Plan: 1. Essential hypertension Chronic.  Controlled.  Stable.  Blood pressure 122/78.  We will continue amlodipine 10-20 mg once a day.  Will check CMP for electrolytes and GFR status. - amLODipine-benazepril (LOTREL) 10-20  MG capsule; TAKE 1 CAPSULE BY MOUTH EVERY DAY  Dispense: 90 capsule; Refill: 1 - Comprehensive Metabolic Panel (CMET)  2. Mixed hyperlipidemia Chronic.  Controlled.  Stable.  Will continue atorvastatin 10 mg once a day.  Will check lipid panel. - atorvastatin (LIPITOR) 10 MG tablet; TAKE 1 TABLET BY MOUTH EVERY DAY  Dispense: 90 tablet; Refill: 1 - Lipid Panel With LDL/HDL Ratio  3. Lumbar disc disease Chronic.  Controlled.  Stable.  Primarily has spasms in the back particularly at night.  We will continue cyclobenzaprine 5 mg 1-2 nightly. - cyclobenzaprine (FLEXERIL) 5 MG tablet; Take 2 tablets (10 mg total) by mouth at bedtime.  Dispense: 180 tablet; Refill: 1  4. Need for immunization against influenza Discussed and administered. - Flu Vaccine QUAD 36+ mos IM

## 2020-03-10 LAB — COMPREHENSIVE METABOLIC PANEL
ALT: 30 IU/L (ref 0–44)
AST: 33 IU/L (ref 0–40)
Albumin/Globulin Ratio: 1.9 (ref 1.2–2.2)
Albumin: 4.6 g/dL (ref 3.8–4.9)
Alkaline Phosphatase: 71 IU/L (ref 44–121)
BUN/Creatinine Ratio: 12 (ref 10–24)
BUN: 15 mg/dL (ref 8–27)
Bilirubin Total: 0.5 mg/dL (ref 0.0–1.2)
CO2: 23 mmol/L (ref 20–29)
Calcium: 9.9 mg/dL (ref 8.6–10.2)
Chloride: 104 mmol/L (ref 96–106)
Creatinine, Ser: 1.29 mg/dL — ABNORMAL HIGH (ref 0.76–1.27)
GFR calc Af Amer: 69 mL/min/{1.73_m2} (ref >59)
GFR calc non Af Amer: 60 mL/min/{1.73_m2} (ref >59)
Globulin, Total: 2.4 g/dL (ref 1.5–4.5)
Glucose: 71 mg/dL (ref 65–99)
Potassium: 4.7 mmol/L (ref 3.5–5.2)
Sodium: 144 mmol/L (ref 134–144)
Total Protein: 7 g/dL (ref 6.0–8.5)

## 2020-03-10 LAB — LIPID PANEL WITH LDL/HDL RATIO
Cholesterol, Total: 201 mg/dL — ABNORMAL HIGH (ref 100–199)
HDL: 48 mg/dL (ref >39)
LDL Chol Calc (NIH): 126 mg/dL — ABNORMAL HIGH (ref 0–99)
LDL/HDL Ratio: 2.6 ratio (ref 0.0–3.6)
Triglycerides: 151 mg/dL — ABNORMAL HIGH (ref 0–149)
VLDL Cholesterol Cal: 27 mg/dL (ref 5–40)

## 2020-04-10 ENCOUNTER — Telehealth: Payer: Self-pay

## 2020-04-10 NOTE — Telephone Encounter (Signed)
Left up front

## 2020-04-10 NOTE — Telephone Encounter (Signed)
Copied from [place] [id number]. 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

## 2020-11-06 ENCOUNTER — Other Ambulatory Visit: Payer: Self-pay | Admitting: Family Medicine

## 2020-11-06 DIAGNOSIS — M519 Unspecified thoracic, thoracolumbar and lumbosacral intervertebral disc disorder: Secondary | ICD-10-CM

## 2020-11-06 DIAGNOSIS — I10 Essential (primary) hypertension: Secondary | ICD-10-CM

## 2020-11-06 NOTE — Telephone Encounter (Signed)
Requested medication (s) are due for refill today: yes   Requested medication (s) are on the active medication list: yes   Last refill:  08/06/2020  Future visit scheduled:no  Notes to clinic: this refill cannot be delegated    Requested Prescriptions  Pending Prescriptions Disp Refills   cyclobenzaprine (FLEXERIL) 5 MG tablet [Pharmacy Med Name: CYCLOBENZAPRINE 5 MG TABLET] 180 tablet 1    Sig: Take 2 tablets (10 mg total) by mouth at bedtime.      Not Delegated - Analgesics:  Muscle Relaxants Failed - 11/06/2020  1:30 AM      Failed - This refill cannot be delegated      Failed - Valid encounter within last 6 months    Recent Outpatient Visits           8 months ago Essential hypertension   [facility] Juline Patch, MD   1 year ago Annual physical exam   [facility] Juline Patch, [facility]   1 year ago Essential hypertension   [facility] Juline Patch, MD   1 year ago Essential hypertension   [facility] Juline Patch, MD   2 years ago Essential hypertension   [facility] Juline Patch, MD                  amLODipine-benazepril (LOTREL) 10-20 MG capsule [Pharmacy Med Name: AMLODIPINE-BENAZEPRIL 10-20 MG] 90 capsule 1    Sig: TAKE 1 CAPSULE BY MOUTH EVERY DAY      Cardiovascular: CCB + ACEI Combos Failed - 11/06/2020  1:30 AM      Failed - Cr in normal range and within 180 days    Creatinine  Date Value Ref Range Status  04/16/2013 1.12 0.60 - 1.30 mg/dL Final   Creatinine, Ser  Date Value Ref Range Status  03/09/2020 1.29 (H) 0.76 - 1.27 mg/dL Final          Failed - K in normal range and within 180 days    Potassium  Date Value Ref Range Status  03/09/2020 4.7 3.5 - 5.2 mmol/L Final  04/16/2013 4.0 3.5 - 5.1 mmol/L Final          Failed - Valid encounter within last 6 months    Recent Outpatient Visits           8 months ago Essential hypertension   [facility] Juline Patch, MD    1 year ago Annual physical exam   [facility] Juline Patch, [facility]   1 year ago Essential hypertension   [facility] Juline Patch, MD   1 year ago Essential hypertension   [facility] Juline Patch, MD   2 years ago Essential hypertension   [facility] Juline Patch, MD                Passed - Patient is not pregnant      Passed - Last BP in normal range    BP Readings from Last 1 Encounters:  03/09/20 122/78

## 2020-11-10 ENCOUNTER — Other Ambulatory Visit: Payer: Self-pay

## 2020-11-10 ENCOUNTER — Encounter: Payer: Self-pay | Admitting: Family Medicine

## 2020-11-10 ENCOUNTER — Ambulatory Visit (INDEPENDENT_AMBULATORY_CARE_PROVIDER_SITE_OTHER): Payer: BC Managed Care – PPO | Admitting: Family Medicine

## 2020-11-10 VITALS — BP 114/68 | HR 84 | Temp 97.7°F | Ht 62.0 in | Wt 176.0 lb

## 2020-11-10 DIAGNOSIS — E782 Mixed hyperlipidemia: Secondary | ICD-10-CM

## 2020-11-10 DIAGNOSIS — M519 Unspecified thoracic, thoracolumbar and lumbosacral intervertebral disc disorder: Secondary | ICD-10-CM

## 2020-11-10 DIAGNOSIS — I10 Essential (primary) hypertension: Secondary | ICD-10-CM | POA: Diagnosis not present

## 2020-11-10 MED ORDER — AMLODIPINE BESY-BENAZEPRIL HCL 10-20 MG PO CAPS: 1.0000 | ORAL_CAPSULE | Freq: Every day | ORAL | 1 refills | Status: DC

## 2020-11-10 MED ORDER — CYCLOBENZAPRINE HCL 5 MG PO TABS: 10.0000 mg | ORAL_TABLET | Freq: Every day | ORAL | 5 refills | Status: DC

## 2020-11-10 MED ORDER — CYCLOBENZAPRINE HCL 5 MG PO TABS: 10.0000 mg | ORAL_TABLET | Freq: Every day | ORAL | 0 refills | Status: DC

## 2020-11-10 MED ORDER — ATORVASTATIN CALCIUM 10 MG PO TABS: ORAL_TABLET | ORAL | 1 refills | Status: DC

## 2020-11-10 NOTE — Progress Notes (Signed)
Date:  11/10/2020   Name:  Alan Trevino   DOB:  1960/11/09   MRN:  [id number]   Chief Complaint: Hypertension, Hyperlipidemia, and Spasms  Hypertension This is a chronic problem. The current episode started more than 1 year ago. The problem has been gradually improving since onset. The problem is controlled. Pertinent negatives include no anxiety, blurred vision, chest pain, headaches, malaise/fatigue, neck pain, orthopnea, palpitations, peripheral edema, PND, shortness of breath or sweats. There are no associated agents to hypertension. Past treatments include ACE inhibitors and calcium channel blockers. The current treatment provides moderate improvement. There are no compliance problems.  There is no history of angina, kidney disease, CAD/MI, CVA, heart failure, left ventricular hypertrophy, PVD or retinopathy. There is no history of chronic renal disease, a hypertension causing med or renovascular disease.  Hyperlipidemia This is a chronic problem. The problem is controlled. Recent lipid tests were reviewed and are normal. He has no history of chronic renal disease, diabetes, hypothyroidism, liver disease, obesity or nephrotic syndrome. Pertinent negatives include no chest pain, focal sensory loss, focal weakness, leg pain, myalgias or shortness of breath. Current antihyperlipidemic treatment includes statins. The current treatment provides moderate improvement of lipids. There are no compliance problems.  Risk factors for coronary artery disease include dyslipidemia and hypertension.   Lab Results  Component Value Date   CREATININE 1.29 (H) 03/09/2020   BUN 15 03/09/2020   NA 144 03/09/2020   K 4.7 03/09/2020   CL 104 03/09/2020   CO2 23 03/09/2020   Lab Results  Component Value Date   CHOL 201 (H) 03/09/2020   HDL 48 03/09/2020   LDLCALC 126 (H) 03/09/2020   TRIG 151 (H) 03/09/2020   CHOLHDL 5.6 (H) 02/01/2019   No results found for: TSH No results found for:  HGBA1C Lab Results  Component Value Date   WBC 12.4 (H) 11/29/2016   HGB 11.6 (L) 12/09/2016   HCT 33.2 (L) 11/29/2016   MCV 93.8 11/29/2016   PLT 196 11/29/2016   Lab Results  Component Value Date   ALT 30 03/09/2020   AST 33 03/09/2020   ALKPHOS 71 03/09/2020   BILITOT 0.5 03/09/2020     Review of Systems  Constitutional:  Negative for chills, fever and malaise/fatigue.  HENT:  Negative for drooling, ear discharge, ear pain and sore throat.   Eyes:  Negative for blurred vision.  Respiratory:  Negative for cough, shortness of breath and wheezing.   Cardiovascular:  Negative for chest pain, palpitations, orthopnea, leg swelling and PND.  Gastrointestinal:  Negative for abdominal pain, blood in stool, constipation, diarrhea and nausea.  Endocrine: Negative for polydipsia.  Genitourinary:  Negative for dysuria, frequency, hematuria and urgency.  Musculoskeletal:  Negative for back pain, myalgias and neck pain.  Skin:  Negative for rash.  Allergic/Immunologic: Negative for environmental allergies.  Neurological:  Negative for dizziness, focal weakness and headaches.  Hematological:  Does not bruise/bleed easily.  Psychiatric/Behavioral:  Negative for suicidal ideas. The patient is not nervous/anxious.    Patient Active Problem List   Diagnosis Date Noted   Personal history of colonic polyps    Acute peptic ulcer without hemorrhage and perforation    Hiatal hernia    Esophagogastric ulcer    Stricture and stenosis of esophagus    GIB (gastrointestinal bleeding) 11/30/2016   Hematemesis without nausea    Acute esophagogastric ulcer    Upper GI bleed 11/28/2016   Acute recurrent maxillary sinusitis 09/07/2016   Special  screening for malignant neoplasms, colon    Benign neoplasm of ascending colon    Benign neoplasm of transverse colon    Benign neoplasm of descending colon    Rectal polyp    Lumbar disc disease 11/03/2015   Iron deficiency anemia 08/25/2014   Acute  gastrointestinal bleeding 08/25/2014   Acute back pain with sciatica 08/25/2014   Routine general medical examination at a health care facility 08/25/2014   DDD (degenerative disc disease), lumbosacral 08/25/2014   Recurrent major depressive episodes ([place]) 08/25/2014   Essential (primary) hypertension 08/25/2014   HLD (hyperlipidemia) 08/25/2014    No Known Allergies  Past Surgical History:  Procedure Laterality Date   COLONOSCOPY WITH PROPOFOL N/A 12/29/2015   Procedure: COLONOSCOPY WITH PROPOFOL;  Surgeon: [name], MD;  Location: [place];  Service: Endoscopy;  Laterality: N/A;   COLONOSCOPY WITH PROPOFOL N/A 11/29/2019   Procedure: COLONOSCOPY WITH PROPOFOL;  Surgeon: [name], MD;  Location: [place];  Service: Endoscopy;  Laterality: N/A;  priority 4   ESOPHAGEAL DILATION     stretching   ESOPHAGEAL DILATION  01/17/2017   Procedure: ESOPHAGEAL DILATION;  Surgeon: [name], MD;  Location: [place];  Service: Gastroenterology;;   ESOPHAGOGASTRODUODENOSCOPY (EGD) WITH PROPOFOL N/A 11/29/2016   Procedure: ESOPHAGOGASTRODUODENOSCOPY (EGD) WITH PROPOFOL;  Surgeon: [name], MD;  Location: [facility] ENDOSCOPY;  Service: Endoscopy;  Laterality: N/A;   ESOPHAGOGASTRODUODENOSCOPY (EGD) WITH PROPOFOL N/A 01/17/2017   Procedure: ESOPHAGOGASTRODUODENOSCOPY (EGD) WITH PROPOFOL;  Surgeon: [name], MD;  Location: [place];  Service: Gastroenterology;  Laterality: N/A;   HERNIA REPAIR     INGUINAL HERNIA REPAIR Left 01/14/2020   Procedure: HERNIA REPAIR INGUINAL ADULT;  Surgeon: Robert Bellow, MD;  Location: [facility];  Service: General;  Laterality: Left;   POLYPECTOMY N/A 12/29/2015   Procedure: POLYPECTOMY;  Surgeon: [name], MD;  Location: [place];  Service: Endoscopy;  Laterality: N/A;   POLYPECTOMY  01/17/2017   Procedure: POLYPECTOMY;  Surgeon: [name], MD;  Location: [place];  Service: Gastroenterology;;    POLYPECTOMY N/A 11/29/2019   Procedure: POLYPECTOMY;  Surgeon: [name], MD;  Location: [place];  Service: Endoscopy;  Laterality: N/A;   SHOULDER SURGERY  09/2009    Social History   Tobacco Use   Smoking status: Former    Packs/day: 2.00    Years: 30.00    Pack years: 60.00    Types: Cigarettes    Quit date: 2000    Years since quitting: 22.5   Smokeless tobacco: Current    Types: Chew  Vaping Use   Vaping Use: Never used  Substance Use Topics   Alcohol use: Yes    Alcohol/week: 12.0 standard drinks    Types: 12 Cans of beer per week    Comment: OCC BEER   Drug use: No     Medication list has been reviewed and updated.  Current Meds  Medication Sig   acetaminophen (TYLENOL) 500 MG tablet Take 1,000 mg by mouth every 6 (six) hours as needed for moderate pain.    amLODipine-benazepril (LOTREL) 10-20 MG capsule TAKE 1 CAPSULE BY MOUTH EVERY DAY   buPROPion (WELLBUTRIN XL) 300 MG 24 hr tablet Take 300 mg by mouth daily.    busPIRone (BUSPAR) 30 MG tablet Take 30 mg by mouth 2 (two) times daily.    clopidogrel (PLAVIX) 75 MG tablet Take 1 tablet (75 mg total) by mouth every Monday, Wednesday, and Friday.   cyclobenzaprine (FLEXERIL)  5 MG tablet TAKE 2 TABLETS (10 MG TOTAL) BY MOUTH AT BEDTIME.   methylphenidate (RITALIN) 20 MG tablet Take 20 mg by mouth 2 (two) times daily with breakfast and lunch.    propranolol (INDERAL) 20 MG tablet neurology    [facility] 2/9 Scores 11/10/2020 03/09/2020 09/10/2019 08/13/2019  PHQ - 2 Score 0 0 0 0  PHQ- 9 Score 0 0 0 0    GAD 7 : Generalized Anxiety Score 11/10/2020 03/09/2020 09/10/2019 08/13/2019  Nervous, Anxious, on Edge 0 0 0 0  Control/stop worrying 0 0 0 0  Worry too much - different things 0 0 0 0  Trouble relaxing 0 0 0 0  Restless 0 0 0 0  Easily annoyed or irritable 0 0 1 1  Afraid - awful might happen 0 0 0 0  Total GAD 7 Score 0 0 1 1  Anxiety Difficulty - - Not difficult at all -    BP Readings from Last  3 Encounters:  11/10/20 114/68  03/09/20 122/78  01/14/20 (!) 158/83    Physical Exam Vitals and nursing note reviewed.  HENT:     Head: Normocephalic.     Right Ear: Tympanic membrane, ear canal and external ear normal.     Left Ear: Tympanic membrane, ear canal and external ear normal.     Nose: Nose normal.  Eyes:     General: No scleral icterus.       Right eye: No discharge.        Left eye: No discharge.     Conjunctiva/sclera: Conjunctivae normal.     Pupils: Pupils are equal, round, and reactive to light.  Neck:     Thyroid: No thyromegaly.     Vascular: No JVD.     Trachea: No tracheal deviation.  Cardiovascular:     Rate and Rhythm: Normal rate and regular rhythm.     Pulses: Normal pulses.     Heart sounds: Normal heart sounds, S1 normal and S2 normal. No murmur heard. No systolic murmur is present.  No diastolic murmur is present.    No friction rub. No gallop. No S3 or S4 sounds.  Pulmonary:     Effort: No respiratory distress.     Breath sounds: Normal breath sounds. No wheezing, rhonchi or rales.  Abdominal:     General: Bowel sounds are normal.     Palpations: Abdomen is soft. There is no mass.     Tenderness: There is no abdominal tenderness. There is no guarding or rebound.  Musculoskeletal:        General: No tenderness. Normal range of motion.     Cervical back: Normal range of motion and neck supple.     Right lower leg: No edema.     Left lower leg: No edema.  Lymphadenopathy:     Cervical: No cervical adenopathy.  Skin:    General: Skin is warm.     Findings: No rash.  Neurological:     Mental Status: He is alert and oriented to person, place, and time.     Cranial Nerves: No cranial nerve deficit.     Deep Tendon Reflexes: Reflexes are normal and symmetric.    Wt Readings from Last 3 Encounters:  11/10/20 176 lb (79.8 kg)  03/09/20 187 lb (84.8 kg)  11/29/19 181 lb (82.1 kg)    BP 114/68   Pulse 84   Temp 97.7 F (36.5 C) (Oral)    Ht '5\' 2"'$  (1.575 m)  Wt 176 lb (79.8 kg)   SpO2 98%   BMI 32.19 kg/m   Assessment and Plan: 1. Essential hypertension Chronic.  Controlled.  Stable.  Blood pressure is 114/68.  Continue amlodipine benazepril 10-20 mg 1 a day.  Will recheck in 6 months at that time we will do renal function panel - amLODipine-benazepril (LOTREL) 10-20 MG capsule; Take 1 capsule by mouth daily.  Dispense: 90 capsule; Refill: 1  2. Mixed hyperlipidemia Chronic.  Controlled.  Stable.  Continue atorvastatin 10 mg once a day. - atorvastatin (LIPITOR) 10 MG tablet; TAKE 1 TABLET BY MOUTH EVERY DAY  Dispense: 90 tablet; Refill: 1  3. Lumbar disc disease Chronic.  Controlled.  Stable.  Continue cyclobenzaprine 5 mg 2 tablets nightly. - cyclobenzaprine (FLEXERIL) 5 MG tablet; Take 2 tablets (10 mg total) by mouth at bedtime.  Dispense: 60 tablet; Refill: 5

## 2020-11-18 ENCOUNTER — Other Ambulatory Visit: Payer: Self-pay

## 2020-11-18 ENCOUNTER — Ambulatory Visit
Admission: EM | Admit: 2020-11-18 | Discharge: 2020-11-18 | Disposition: A | Discharge status: Home / Self Care | Payer: BC Managed Care – PPO | Attending: Emergency Medicine | Admitting: Emergency Medicine

## 2020-11-18 DIAGNOSIS — M5441 Lumbago with sciatica, right side: Secondary | ICD-10-CM | POA: Diagnosis not present

## 2020-11-18 DIAGNOSIS — M25511 Pain in right shoulder: Secondary | ICD-10-CM

## 2020-11-18 MED ORDER — METHOCARBAMOL 500 MG PO TABS: 500.0000 mg | ORAL_TABLET | Freq: Two times a day (BID) | ORAL | 0 refills | Status: DC | PRN

## 2020-11-18 NOTE — Discharge Instructions (Addendum)
Take the muscle relaxer as needed for muscle spasm; Do not drive, operate machinery, or drink alcohol with this medication as it can cause drowsiness.   Follow up with your primary care provider or a neurosurgeon if your symptoms are not improving.

## 2020-11-18 NOTE — ED Provider Notes (Signed)
[facility]    CSN: [id number] Arrival date & time: 11/18/20  1319      History   Chief Complaint Chief Complaint  Patient presents with   Hip Pain    right     HPI Alan Trevino is a 60 y.o. male.  Patient presents with 45-monthhistory of right lower back pain which radiates down his right leg.  No falls or injury.  The pain is worse with ambulation and weight bearing.  He also reports pain in his right shoulder radiating down his right arm.  He denies numbness, weakness, saddle anesthesia, loss of bowel/bladder control, abdominal pain, dysuria, or other symptoms.  He is not able to take NSAIDs due to history of GI bleed.  His medical history includes DDD, sciatica, lumbar disc disease.  The history is provided by the patient, the spouse and medical records.   Past Medical History:  Diagnosis Date   Depression    GERD (gastroesophageal reflux disease)    H/O ulcer disease    Headache    MIGRAINES   Hypertension    SBO (small bowel obstruction) (HCC)    Wears dentures    full upper    Patient Active Problem List   Diagnosis Date Noted   Personal history of colonic polyps    Acute peptic ulcer without hemorrhage and perforation    Hiatal hernia    Esophagogastric ulcer    Stricture and stenosis of esophagus    GIB (gastrointestinal bleeding) 11/30/2016   Hematemesis without nausea    Acute esophagogastric ulcer    Upper GI bleed 11/28/2016   Acute recurrent maxillary sinusitis 09/07/2016   Special screening for malignant neoplasms, colon    Benign neoplasm of ascending colon    Benign neoplasm of transverse colon    Benign neoplasm of descending colon    Rectal polyp    Lumbar disc disease 11/03/2015   Iron deficiency anemia 08/25/2014   Acute gastrointestinal bleeding 08/25/2014   Acute back pain with sciatica 08/25/2014   Routine general medical examination at a health care facility 08/25/2014   DDD (degenerative disc disease), lumbosacral  08/25/2014   [place] episodes [place] 08/25/2014   Essential (primary) hypertension 08/25/2014   HLD (hyperlipidemia) 08/25/2014    Past Surgical History:  Procedure Laterality Date   COLONOSCOPY WITH PROPOFOL N/A 12/29/2015   Procedure: COLONOSCOPY WITH PROPOFOL;  Surgeon: DLucilla Lame MD;  [place]  Service: [place]oscopy;  Laterality: N/A;   COLONOSCOPY WITH PROPOFOL N/A 11/29/2019   Procedure: COLONOSCOPY WITH PROPOFOL;  Surgeon: WLucilla Lame MD;  [place]  Service: Endoscopy;  Laterality: N/A;  priority 4   ESOPHAGEAL DILATION     stretching   ESOPHAGEAL DILATION  01/17/2017   Procedure: ESOPHAGEAL DILATION;  Surgeon: WLucilla Lame MD;  [place]  Service: Gastroenterology;;   ESOPHAGOGASTRODUODENOSCOPY (EGD) WITH PROPOFOL N/A 11/29/2016   Procedure: ESOPHAGOGASTRODUODENOSCOPY (EGD) WITH PROPOFOL;  Surgeon: WLucilla Lame MD;  [facility]  Service: Endoscopy;  Laterality: N/A;   ESOPHAGOGASTRODUODENOSCOPY (EGD) WITH PROPOFOL N/A 01/17/2017   Procedure: ESOPHAGOGASTRODUODENOSCOPY (EGD) WITH PROPOFOL;  Surgeon: WLucilla Lame MD;  [place]  Service: Gastroenterology;  Laterality: N/A;   HERNIA REPAIR     INGUINAL HERNIA REPAIR Left 01/14/2020   Procedure: HERNIA REPAIR INGUINAL ADULT;  Surgeon: BRobert Bellow MD;  Location: [facility];  Service: General;  Laterality: Left;   POLYPECTOMY N/A 12/29/2015   Procedure: POLYPECTOMY;  Surgeon:  [name], MD;  Location: [place];  Service: Endoscopy;  Laterality: N/A;   POLYPECTOMY  01/17/2017   Procedure: POLYPECTOMY;  Surgeon: [name], MD;  Location: [place];  Service: Gastroenterology;;   POLYPECTOMY N/A 11/29/2019   Procedure: POLYPECTOMY;  Surgeon: [name], MD;  Location: [place];  Service: Endoscopy;  Laterality: N/A;   SHOULDER SURGERY  09/2009       Home Medications    Prior to  Admission medications   Medication Sig Start Date End Date Taking? Authorizing Provider  acetaminophen (TYLENOL) 500 MG tablet Take 1,000 mg by mouth every 6 (six) hours as needed for moderate pain.    Yes [provider]  amLODipine-benazepril (LOTREL) 10-20 MG capsule Take 1 capsule by mouth daily. 11/10/20  Yes Juline Patch, MD  atorvastatin (LIPITOR) 10 MG tablet TAKE 1 TABLET BY MOUTH EVERY DAY 11/10/20  Yes Juline Patch, MD  buPROPion (WELLBUTRIN XL) 300 MG 24 hr tablet Take 300 mg by mouth daily.  10/11/13  Yes [provider]  busPIRone (BUSPAR) 30 MG tablet Take 30 mg by mouth 2 (two) times daily.    Yes [provider]  clopidogrel (PLAVIX) 75 MG tablet Take 1 tablet (75 mg total) by mouth every Monday, Wednesday, and Friday. 01/19/20  Yes Byrnett, Forest Gleason, MD  cyclobenzaprine (FLEXERIL) 5 MG tablet Take 2 tablets (10 mg total) by mouth at bedtime. 11/10/20  Yes Juline Patch, MD  methocarbamol (ROBAXIN) 500 MG tablet Take 1 tablet (500 mg total) by mouth 2 (two) times daily as needed for muscle spasms. 11/18/20  Yes Sharion Balloon, NP  methylphenidate (RITALIN) 20 MG tablet Take 20 mg by mouth 2 (two) times daily with breakfast and lunch.    Yes [provider]  propranolol (INDERAL) 20 MG tablet neurology 08/25/20  Yes [provider]    Family History Family History  Problem Relation Age of Onset   Ovarian cancer Mother    CAD Father     Social History Social History   Tobacco Use   Smoking status: Former    Packs/day: 2.00    Years: 30.00    Pack years: 60.00    Types: Cigarettes    Quit date: 2000    Years since quitting: 22.5   Smokeless tobacco: Current    Types: Chew  Vaping Use   Vaping Use: Never used  Substance Use Topics   Alcohol use: Yes    Alcohol/week: 12.0 standard drinks    Types: 12 Cans of beer per week    Comment: OCC BEER   Drug use: No     Allergies   Patient has no known  allergies.   Review of Systems Review of Systems  Constitutional:  Negative for chills and fever.  Respiratory:  Negative for cough and shortness of breath.   Cardiovascular:  Negative for chest pain and palpitations.  Gastrointestinal:  Negative for abdominal pain and vomiting.  Genitourinary:  Negative for dysuria and hematuria.  Musculoskeletal:  Positive for arthralgias and back pain.  Skin:  Negative for color change and rash.  Neurological:  Negative for weakness and numbness.  All other systems reviewed and are negative.   Physical Exam Triage Vital Signs ED Triage Vitals  Enc Vitals Group     BP      Pulse      Resp      Temp      Temp src  SpO2      Weight      Height      Head Circumference      Peak Flow      Pain Score      Pain Loc      Pain Edu?      Excl. in [place]?    No data found.  Updated Vital Signs BP 130/78 (BP Location: Left Arm)   Pulse 80   Temp 98.6 F (37 C) (Oral)   Resp 18   Ht '5\' 2"'$  (1.575 m)   Wt 176 lb (79.8 kg)   SpO2 100%   BMI 32.19 kg/m   Visual Acuity Right Eye Distance:   Left Eye Distance:   Bilateral Distance:    Right Eye Near:   Left Eye Near:    Bilateral Near:     Physical Exam Vitals and nursing note reviewed.  Constitutional:      General: He is not in acute distress.    Appearance: He is well-developed.  HENT:     Head: Normocephalic and atraumatic.     Mouth/Throat:     Mouth: Mucous membranes are moist.  Eyes:     Conjunctiva/sclera: Conjunctivae normal.  Cardiovascular:     Rate and Rhythm: Normal rate and regular rhythm.     Heart sounds: Normal heart sounds.  Pulmonary:     Effort: Pulmonary effort is normal. No respiratory distress.     Breath sounds: Normal breath sounds.  Abdominal:     General: Bowel sounds are normal. There is no distension.     Palpations: Abdomen is soft.     Tenderness: There is no abdominal tenderness. There is no right CVA tenderness, left CVA tenderness,  guarding or rebound.  Musculoskeletal:        General: No swelling, tenderness, deformity or signs of injury. Normal range of motion.     Cervical back: Neck supple.  Skin:    General: Skin is warm and dry.     Capillary Refill: Capillary refill takes less than 2 seconds.     Findings: No bruising, erythema, lesion or rash.  Neurological:     General: No focal deficit present.     Mental Status: He is alert and oriented to person, place, and time.     Sensory: No sensory deficit.     Motor: No weakness.     Gait: Gait normal.  Psychiatric:        Mood and Affect: Mood normal.        Behavior: Behavior normal.     UC Treatments / Results  Labs (all labs ordered are listed, but only abnormal results are displayed) Labs Reviewed - No data to display  EKG   Radiology No results found.  Procedures Procedures (including critical care time)  Medications Ordered in UC Medications - No data to display  Initial Impression / Assessment and Plan / UC Course  I have reviewed the triage vital signs and the nursing notes.  Pertinent labs & imaging results that were available during my care of the patient were reviewed by me and considered in my medical decision making (see chart for details).  Right lower back pain with right-sided sciatica, right shoulder pain.  Vernard Gambles is not able to take NSAIDs due to history of GI bleed.  Treating with methocarbamol.  Precautions for drowsiness with this medication discussed.  Instructed patient to follow-up with his PCP or a neurosurgeon.  Education provided on sciatica.  Patient agrees  to plan of care.   Final Clinical Impressions(s) / UC Diagnoses   Final diagnoses:  Acute right-sided low back pain with right-sided sciatica  Acute pain of right shoulder     Discharge Instructions      Take the muscle relaxer as needed for muscle spasm; Do not drive, operate machinery, or drink alcohol with this medication as it can cause drowsiness.    Follow up with your primary care provider or a neurosurgeon if your symptoms are not improving.           ED Prescriptions     Medication Sig Dispense Auth. Provider   methocarbamol (ROBAXIN) 500 MG tablet Take 1 tablet (500 mg total) by mouth 2 (two) times daily as needed for muscle spasms. 10 tablet Sharion Balloon, NP      PDMP not reviewed this encounter.   Sharion Balloon, NP 11/18/20 1401

## 2020-11-18 NOTE — ED Triage Notes (Signed)
Patient states that he has pain from right hip down to toes since memorial day. Patient also states that he is having pain from shoulder down his arm for the same amount time. Patient states that he is concerned for sciatica.

## 2020-12-01 ENCOUNTER — Other Ambulatory Visit: Payer: Self-pay | Admitting: Orthopedic Surgery

## 2020-12-12 ENCOUNTER — Inpatient Hospital Stay: Admission: RE | Admit: 2020-12-12 | Payer: BC Managed Care – PPO | Source: Ambulatory Visit

## 2020-12-12 ENCOUNTER — Other Ambulatory Visit: Payer: Self-pay

## 2020-12-12 ENCOUNTER — Encounter
Admission: RE | Admit: 2020-12-12 | Discharge: 2020-12-12 | Disposition: A | Discharge status: Home / Self Care | Payer: BC Managed Care – PPO | Source: Ambulatory Visit | Attending: Orthopedic Surgery | Admitting: Orthopedic Surgery

## 2020-12-12 DIAGNOSIS — Z01818 Encounter for other preprocedural examination: Secondary | ICD-10-CM | POA: Diagnosis present

## 2020-12-12 HISTORY — DX: Unspecified osteoarthritis, unspecified site: M19.90

## 2020-12-12 LAB — URINALYSIS, ROUTINE W REFLEX MICROSCOPIC
Bacteria, UA: NONE SEEN
Bilirubin Urine: NEGATIVE
Glucose, UA: NEGATIVE mg/dL
Ketones, ur: NEGATIVE mg/dL
Leukocytes,Ua: NEGATIVE
Nitrite: NEGATIVE
Protein, ur: NEGATIVE mg/dL
Specific Gravity, Urine: 1.009 (ref 1.005–1.030)
Squamous Epithelial / HPF: NONE SEEN (ref 0–5)
pH: 5 (ref 5.0–8.0)

## 2020-12-12 LAB — COMPREHENSIVE METABOLIC PANEL
ALT: 26 U/L (ref 0–44)
AST: 18 U/L (ref 15–41)
Albumin: 4 g/dL (ref 3.5–5.0)
Alkaline Phosphatase: 69 U/L (ref 38–126)
Anion gap: 11 (ref 5–15)
BUN: 13 mg/dL (ref 8–23)
CO2: 23 mmol/L (ref 22–32)
Calcium: 9.6 mg/dL (ref 8.9–10.3)
Chloride: 102 mmol/L (ref 98–111)
Creatinine, Ser: 1.1 mg/dL (ref 0.61–1.24)
GFR, Estimated: >60 mL/min (ref >60)
Glucose, Bld: 84 mg/dL (ref 70–99)
Potassium: 4 mmol/L (ref 3.5–5.1)
Sodium: 136 mmol/L (ref 135–145)
Total Bilirubin: 1.1 mg/dL (ref 0.3–1.2)
Total Protein: 7.7 g/dL (ref 6.5–8.1)

## 2020-12-12 LAB — CBC WITH DIFFERENTIAL/PLATELET
Abs Immature Granulocytes: 0.09 10*3/uL — ABNORMAL HIGH (ref 0.00–0.07)
Basophils Absolute: 0.1 10*3/uL (ref 0.0–0.1)
Basophils Relative: 0 %
Eosinophils Absolute: 0.2 10*3/uL (ref 0.0–0.5)
Eosinophils Relative: 1 %
HCT: 44.3 % (ref 39.0–52.0)
Hemoglobin: 15.2 g/dL (ref 13.0–17.0)
Immature Granulocytes: 1 %
Lymphocytes Relative: 17 %
Lymphs Abs: 2.4 10*3/uL (ref 0.7–4.0)
MCH: 31.5 pg (ref 26.0–34.0)
MCHC: 34.3 g/dL (ref 30.0–36.0)
MCV: 91.9 fL (ref 80.0–100.0)
Monocytes Absolute: 0.9 10*3/uL (ref 0.1–1.0)
Monocytes Relative: 7 %
Neutro Abs: 10.6 10*3/uL — ABNORMAL HIGH (ref 1.7–7.7)
Neutrophils Relative %: 74 %
Platelets: 224 10*3/uL (ref 150–400)
RBC: 4.82 MIL/uL (ref 4.22–5.81)
RDW: 12.1 % (ref 11.5–15.5)
WBC: 14.2 10*3/uL — ABNORMAL HIGH (ref 4.0–10.5)
nRBC: 0 % (ref 0.0–0.2)

## 2020-12-12 LAB — SURGICAL PCR SCREEN
MRSA, PCR: NEGATIVE
Staphylococcus aureus: NEGATIVE

## 2020-12-12 NOTE — Patient Instructions (Signed)
Your procedure is scheduled [id number] 9/1 Report to .registration desk in medical mall then to 2nd floor surgery desk To find out your arrival time please call [phone] between [place] on Wed. 8/31.  Remember: Instructions that are not followed completely may result in serious medical risk,  up to and including death, or upon the discretion of your surgeon and anesthesiologist your  surgery may need to be rescheduled.     _X__ 1. Do not eat food after midnight the night before your procedure.                 No chewing gum or hard candies. You may drink clear liquids up to 2 hours                 before you are scheduled to arrive for your surgery- DO not drink clear                 liquids within 2 hours of the start of your surgery.                 Clear Liquids include:  water, apple juice without pulp, clear Gatorade, G2 or                  Gatorade Zero (avoid Red/Purple/Blue), Black Coffee or Tea (Do not add                 anything to coffee or tea). __x___2.   Complete the "Ensure Clear Pre-surgery Clear Carbohydrate Drink provided to you, 2 hours before arrival.   __X__2.  On the morning of surgery brush your teeth with toothpaste and water, you                may rinse your mouth with mouthwash if you wish.  Do not swallow any toothpaste of mouthwash.     _X__ 3.  No Alcohol for 24 hours before or after surgery.   ___ 4.  Do Not Smoke or use e-cigarettes For 24 Hours Prior to Your Surgery.                 Do not use any chewable tobacco products for at least 6 hours prior to                 Surgery.  ___  5.  Do not use any recreational drugs (marijuana, cocaine, heroin, ecstasy,       MDMA or other) For at least one week prior to your surgery.            Combination of these drugs with anesthesia may have life threatening       results.  ____  6.  Bring all medications with you on the day of surgery if instructed.   __x__  7.  Notify your doctor  if there is any change in your medical condition      (cold, fever, infections).     Do not wear jewelry, Do not wear lotions, You may wear deodorant. Do not shave 48 hours prior to surgery. Men may shave face and neck. Do not bring valuables to the hospital.    [facility] is not responsible for any belongings or valuables.  Contacts, dentures or bridgework may not be worn into surgery. Leave your suitcase in the car. After surgery it may be brought to your room. For patients admitted to the hospital, discharge time is determined by your treatment team.   Patients  discharged the day of surgery will not be allowed to drive home.   Make arrangements for someone to be with you for the first 24 hours of your Same Day Discharge.    Please read over the following fact sheets that you were given:      __x__ Take these medicines the morning of surgery with A SIP OF WATER:    1. Pain medication if needed  2. atorvastatin (LIPITOR) 10 MG tablet  3. buPROPion (WELLBUTRIN XL) 300 MG 24 hr tablet  4.busPIRone (BUSPAR) 30 MG tablet  5.propranolol (INDERAL) 20 MG tablet  6.  ____ Fleet Enema (as directed)   ___x_ Use CHG Soap (or wipes) as directed  ____ Use Benzoyl Peroxide Gel as instructed  ____ Use inhalers on the day of surgery  ____ Stop metformin 2 days prior to surgery    ____ Take 1/2 of usual insulin dose the night before surgery. No insulin the morning          of surgery.   __x__ Stop Plavix per MD instructions  ____ Stop Anti-inflammatories on    ____ Stop supplements until after surgery.    ____ Bring C-Pap to the hospital.    If you have any questions regarding your pre-procedure instructions,  Please call Pre-admit Testing at [phone]

## 2020-12-12 NOTE — Pre-Procedure Instructions (Signed)
Patient will call the prescribing MD  (neurologist)to determine when to stop his Plavix.  He takes it MWF.

## 2020-12-19 ENCOUNTER — Other Ambulatory Visit
Admission: RE | Admit: 2020-12-19 | Discharge: 2020-12-19 | Disposition: A | Discharge status: Home / Self Care | Payer: BC Managed Care – PPO | Source: Ambulatory Visit | Attending: Orthopedic Surgery | Admitting: Orthopedic Surgery

## 2020-12-19 ENCOUNTER — Other Ambulatory Visit: Payer: Self-pay

## 2020-12-19 DIAGNOSIS — Z01812 Encounter for preprocedural laboratory examination: Secondary | ICD-10-CM | POA: Diagnosis present

## 2020-12-19 DIAGNOSIS — Z20822 Contact with and (suspected) exposure to covid-19: Secondary | ICD-10-CM | POA: Diagnosis not present

## 2020-12-20 LAB — SARS CORONAVIRUS 2 (TAT 6-24 HRS): SARS Coronavirus 2: NEGATIVE

## 2020-12-20 LAB — TYPE AND SCREEN
ABO/RH(D): O NEG
Antibody Screen: NEGATIVE

## 2020-12-20 MED ORDER — FAMOTIDINE 20 MG PO TABS: 20.0000 mg | ORAL_TABLET | Freq: Once | ORAL | Status: AC

## 2020-12-20 MED ORDER — ORAL CARE MOUTH RINSE
15.0000 mL | Freq: Once | OROMUCOSAL | Status: AC
Start: 2020-12-20 — End: 2020-12-21

## 2020-12-20 MED ORDER — CHLORHEXIDINE GLUCONATE 0.12 % MT SOLN: 15.0000 mL | Freq: Once | OROMUCOSAL | Status: AC

## 2020-12-20 MED ORDER — CEFAZOLIN SODIUM-DEXTROSE 2-4 GM/100ML-% IV SOLN
2.0000 g | INTRAVENOUS | Status: AC
  Administered 2020-12-21: 2 g via INTRAVENOUS

## 2020-12-20 MED ORDER — LACTATED RINGERS IV SOLN: INTRAVENOUS | Status: DC

## 2020-12-21 ENCOUNTER — Encounter: Payer: Self-pay | Admitting: Orthopedic Surgery

## 2020-12-21 ENCOUNTER — Encounter
Admission: RE | Disposition: A | Discharge status: Home / Self Care | Payer: Self-pay | Source: Ambulatory Visit | Attending: Orthopedic Surgery

## 2020-12-21 ENCOUNTER — Observation Stay
Admission: RE | Admit: 2020-12-21 | Discharge: 2020-12-22 | Disposition: A | Discharge status: Home / Self Care | Payer: BC Managed Care – PPO | Source: Ambulatory Visit | Attending: Orthopedic Surgery | Admitting: Orthopedic Surgery

## 2020-12-21 ENCOUNTER — Ambulatory Visit: Payer: BC Managed Care – PPO | Admitting: Anesthesiology

## 2020-12-21 ENCOUNTER — Observation Stay: Payer: BC Managed Care – PPO

## 2020-12-21 ENCOUNTER — Ambulatory Visit: Payer: BC Managed Care – PPO

## 2020-12-21 ENCOUNTER — Other Ambulatory Visit: Payer: Self-pay

## 2020-12-21 ENCOUNTER — Ambulatory Visit: Payer: BC Managed Care – PPO | Admitting: Urgent Care

## 2020-12-21 DIAGNOSIS — Z79899 Other long term (current) drug therapy: Secondary | ICD-10-CM | POA: Diagnosis not present

## 2020-12-21 DIAGNOSIS — M1611 Unilateral primary osteoarthritis, right hip: Secondary | ICD-10-CM | POA: Diagnosis not present

## 2020-12-21 DIAGNOSIS — Z96641 Presence of right artificial hip joint: Secondary | ICD-10-CM

## 2020-12-21 DIAGNOSIS — Z96619 Presence of unspecified artificial shoulder joint: Secondary | ICD-10-CM | POA: Diagnosis not present

## 2020-12-21 DIAGNOSIS — I10 Essential (primary) hypertension: Secondary | ICD-10-CM | POA: Diagnosis not present

## 2020-12-21 DIAGNOSIS — Z419 Encounter for procedure for purposes other than remedying health state, unspecified: Secondary | ICD-10-CM

## 2020-12-21 DIAGNOSIS — G8918 Other acute postprocedural pain: Secondary | ICD-10-CM

## 2020-12-21 HISTORY — PX: TOTAL HIP ARTHROPLASTY: SHX124

## 2020-12-21 HISTORY — DX: Presence of right artificial hip joint: Z96.641

## 2020-12-21 SURGERY — ARTHROPLASTY, HIP, TOTAL, ANTERIOR APPROACH
Anesthesia: General | Site: Hip | Laterality: Right

## 2020-12-21 MED ORDER — DOCUSATE SODIUM 100 MG PO CAPS
100.0000 mg | ORAL_CAPSULE | Freq: Two times a day (BID) | ORAL | Status: DC
  Administered 2020-12-21 – 2020-12-22 (×2): 100 mg via ORAL
  Filled 2020-12-21 (×2): qty 1

## 2020-12-21 MED ORDER — PHENYLEPHRINE HCL (PRESSORS) 10 MG/ML IV SOLN
INTRAVENOUS | Status: DC | PRN
  Administered 2020-12-21 (×2): 100 ug via INTRAVENOUS

## 2020-12-21 MED ORDER — PROPRANOLOL HCL 20 MG PO TABS: 20.0000 mg | ORAL_TABLET | ORAL | Status: DC

## 2020-12-21 MED ORDER — ONDANSETRON HCL 4 MG/2ML IJ SOLN: 4.0000 mg | Freq: Four times a day (QID) | INTRAMUSCULAR | Status: DC | PRN

## 2020-12-21 MED ORDER — SODIUM CHLORIDE 0.9 % IR SOLN
Status: DC | PRN
  Administered 2020-12-21: 1004 mL

## 2020-12-21 MED ORDER — MORPHINE SULFATE (PF) 2 MG/ML IV SOLN
0.5000 mg | INTRAVENOUS | Status: DC | PRN
Start: 2020-12-21 — End: 2020-12-22
  Administered 2020-12-21 (×2): 1 mg via INTRAVENOUS
  Filled 2020-12-21: qty 1

## 2020-12-21 MED ORDER — DEXAMETHASONE SODIUM PHOSPHATE 10 MG/ML IJ SOLN
INTRAMUSCULAR | Status: DC | PRN
  Administered 2020-12-21: 5 mg via INTRAVENOUS

## 2020-12-21 MED ORDER — PROPRANOLOL HCL 20 MG PO TABS
40.0000 mg | ORAL_TABLET | Freq: Every day | ORAL | Status: DC
  Administered 2020-12-22: 40 mg via ORAL
  Filled 2020-12-21: qty 2

## 2020-12-21 MED ORDER — MIDAZOLAM HCL 2 MG/2ML IJ SOLN
INTRAMUSCULAR | Status: DC | PRN
  Administered 2020-12-21: 2 mg via INTRAVENOUS

## 2020-12-21 MED ORDER — FENTANYL CITRATE (PF) 100 MCG/2ML IJ SOLN
INTRAMUSCULAR | Status: AC
  Filled 2020-12-21: qty 2

## 2020-12-21 MED ORDER — CEFAZOLIN SODIUM-DEXTROSE 2-4 GM/100ML-% IV SOLN
INTRAVENOUS | Status: AC
  Administered 2020-12-21: 2 g via INTRAVENOUS
  Filled 2020-12-21: qty 100

## 2020-12-21 MED ORDER — ASPIRIN 81 MG PO CHEW
81.0000 mg | CHEWABLE_TABLET | Freq: Two times a day (BID) | ORAL | Status: DC
  Administered 2020-12-21 – 2020-12-22 (×2): 81 mg via ORAL
  Filled 2020-12-21 (×2): qty 1

## 2020-12-21 MED ORDER — FAMOTIDINE 20 MG PO TABS
ORAL_TABLET | ORAL | Status: AC
  Administered 2020-12-21: 20 mg via ORAL
  Filled 2020-12-21: qty 1

## 2020-12-21 MED ORDER — PANTOPRAZOLE SODIUM 40 MG PO TBEC
40.0000 mg | DELAYED_RELEASE_TABLET | Freq: Every day | ORAL | Status: DC
  Administered 2020-12-22: 40 mg via ORAL
  Filled 2020-12-21: qty 1

## 2020-12-21 MED ORDER — MENTHOL 3 MG MT LOZG
1.0000 | LOZENGE | OROMUCOSAL | Status: DC | PRN
  Filled 2020-12-21: qty 9

## 2020-12-21 MED ORDER — HYDROCODONE-ACETAMINOPHEN 5-325 MG PO TABS
1.0000 | ORAL_TABLET | ORAL | Status: DC | PRN
  Administered 2020-12-21 (×2): 2 via ORAL

## 2020-12-21 MED ORDER — ATORVASTATIN CALCIUM 10 MG PO TABS
10.0000 mg | ORAL_TABLET | Freq: Every day | ORAL | Status: DC
  Administered 2020-12-22: 10 mg via ORAL
  Filled 2020-12-21: qty 1

## 2020-12-21 MED ORDER — PHENOL 1.4 % MT LIQD
1.0000 | OROMUCOSAL | Status: DC | PRN
  Filled 2020-12-21: qty 177

## 2020-12-21 MED ORDER — CEFAZOLIN SODIUM-DEXTROSE 2-4 GM/100ML-% IV SOLN
2.0000 g | Freq: Four times a day (QID) | INTRAVENOUS | Status: AC
Start: 2020-12-21 — End: 2020-12-21
  Filled 2020-12-21: qty 100

## 2020-12-21 MED ORDER — SUGAMMADEX SODIUM 200 MG/2ML IV SOLN
INTRAVENOUS | Status: DC | PRN
  Administered 2020-12-21: 200 mg via INTRAVENOUS

## 2020-12-21 MED ORDER — OXYCODONE HCL 5 MG PO TABS
5.0000 mg | ORAL_TABLET | Freq: Once | ORAL | Status: AC | PRN
  Administered 2020-12-21: 5 mg via ORAL

## 2020-12-21 MED ORDER — PROPOFOL 10 MG/ML IV BOLUS
INTRAVENOUS | Status: AC
  Filled 2020-12-21: qty 60

## 2020-12-21 MED ORDER — NEOMYCIN-POLYMYXIN B GU 40-200000 IR SOLN
Status: AC
  Filled 2020-12-21: qty 4

## 2020-12-21 MED ORDER — AMLODIPINE BESYLATE 10 MG PO TABS
10.0000 mg | ORAL_TABLET | Freq: Every day | ORAL | Status: DC
  Administered 2020-12-21 – 2020-12-22 (×2): 10 mg via ORAL
  Filled 2020-12-21 (×2): qty 1

## 2020-12-21 MED ORDER — METHOCARBAMOL 1000 MG/10ML IJ SOLN
500.0000 mg | Freq: Four times a day (QID) | INTRAVENOUS | Status: DC | PRN
  Administered 2020-12-21: 500 mg via INTRAVENOUS
  Filled 2020-12-21: qty 5

## 2020-12-21 MED ORDER — TRAMADOL HCL 50 MG PO TABS
50.0000 mg | ORAL_TABLET | Freq: Four times a day (QID) | ORAL | Status: DC
  Administered 2020-12-21 – 2020-12-22 (×3): 50 mg via ORAL
  Filled 2020-12-21 (×3): qty 1

## 2020-12-21 MED ORDER — POLYETHYLENE GLYCOL 3350 17 G PO PACK
17.0000 g | PACK | Freq: Every day | ORAL | Status: DC | PRN
  Administered 2020-12-22: 17 g via ORAL
  Filled 2020-12-21 (×2): qty 1

## 2020-12-21 MED ORDER — FLEET ENEMA 7-19 GM/118ML RE ENEM: 1.0000 | ENEMA | Freq: Once | RECTAL | Status: DC | PRN

## 2020-12-21 MED ORDER — ZOLPIDEM TARTRATE 5 MG PO TABS: 5.0000 mg | ORAL_TABLET | Freq: Every evening | ORAL | Status: DC | PRN

## 2020-12-21 MED ORDER — SODIUM CHLORIDE 0.9 % IV SOLN: INTRAVENOUS | Status: DC

## 2020-12-21 MED ORDER — BISACODYL 10 MG RE SUPP
10.0000 mg | Freq: Every day | RECTAL | Status: DC | PRN
  Filled 2020-12-21: qty 1

## 2020-12-21 MED ORDER — ACETAMINOPHEN 10 MG/ML IV SOLN
INTRAVENOUS | Status: DC | PRN
  Administered 2020-12-21: 1000 mg via INTRAVENOUS

## 2020-12-21 MED ORDER — ACETAMINOPHEN 10 MG/ML IV SOLN
INTRAVENOUS | Status: AC
  Filled 2020-12-21: qty 100

## 2020-12-21 MED ORDER — METOCLOPRAMIDE HCL 5 MG/ML IJ SOLN: 5.0000 mg | Freq: Three times a day (TID) | INTRAMUSCULAR | Status: DC | PRN

## 2020-12-21 MED ORDER — CHLORHEXIDINE GLUCONATE 0.12 % MT SOLN
OROMUCOSAL | Status: AC
  Administered 2020-12-21: 15 mL via OROMUCOSAL
  Filled 2020-12-21: qty 15

## 2020-12-21 MED ORDER — BENAZEPRIL HCL 20 MG PO TABS
20.0000 mg | ORAL_TABLET | Freq: Every day | ORAL | Status: DC
  Administered 2020-12-21 – 2020-12-22 (×2): 20 mg via ORAL
  Filled 2020-12-21 (×2): qty 1

## 2020-12-21 MED ORDER — ALUM & MAG HYDROXIDE-SIMETH 200-200-20 MG/5ML PO SUSP: 30.0000 mL | ORAL | Status: DC | PRN

## 2020-12-21 MED ORDER — METOCLOPRAMIDE HCL 10 MG PO TABS: 5.0000 mg | ORAL_TABLET | Freq: Three times a day (TID) | ORAL | Status: DC | PRN

## 2020-12-21 MED ORDER — BUPIVACAINE-EPINEPHRINE (PF) 0.25% -1:200000 IJ SOLN
INTRAMUSCULAR | Status: AC
  Filled 2020-12-21: qty 30

## 2020-12-21 MED ORDER — METHOCARBAMOL 500 MG PO TABS: 500.0000 mg | ORAL_TABLET | Freq: Four times a day (QID) | ORAL | Status: DC | PRN

## 2020-12-21 MED ORDER — MORPHINE SULFATE (PF) 4 MG/ML IV SOLN
INTRAVENOUS | Status: AC
  Filled 2020-12-21: qty 1

## 2020-12-21 MED ORDER — BUPROPION HCL ER (XL) 300 MG PO TB24
300.0000 mg | ORAL_TABLET | Freq: Every day | ORAL | Status: DC
  Administered 2020-12-22: 300 mg via ORAL
  Filled 2020-12-21: qty 1

## 2020-12-21 MED ORDER — OXYCODONE HCL 5 MG PO TABS
ORAL_TABLET | ORAL | Status: AC
  Filled 2020-12-21: qty 1

## 2020-12-21 MED ORDER — HYDROCODONE-ACETAMINOPHEN 5-325 MG PO TABS
ORAL_TABLET | ORAL | Status: AC
  Filled 2020-12-21: qty 2

## 2020-12-21 MED ORDER — ONDANSETRON HCL 4 MG PO TABS: 4.0000 mg | ORAL_TABLET | Freq: Four times a day (QID) | ORAL | Status: DC | PRN

## 2020-12-21 MED ORDER — BUSPIRONE HCL 10 MG PO TABS
30.0000 mg | ORAL_TABLET | Freq: Two times a day (BID) | ORAL | Status: DC
  Administered 2020-12-21 – 2020-12-22 (×2): 30 mg via ORAL
  Filled 2020-12-21: qty 3
  Filled 2020-12-21: qty 2
  Filled 2020-12-21: qty 3

## 2020-12-21 MED ORDER — LIDOCAINE HCL (CARDIAC) PF 100 MG/5ML IV SOSY
PREFILLED_SYRINGE | INTRAVENOUS | Status: DC | PRN
  Administered 2020-12-21: 100 mg via INTRAVENOUS

## 2020-12-21 MED ORDER — BUPIVACAINE LIPOSOME 1.3 % IJ SUSP
INTRAMUSCULAR | Status: AC
  Filled 2020-12-21: qty 20

## 2020-12-21 MED ORDER — TRAMADOL HCL 50 MG PO TABS
ORAL_TABLET | ORAL | Status: AC
  Administered 2020-12-21: 50 mg via ORAL
  Filled 2020-12-21: qty 1

## 2020-12-21 MED ORDER — ACETAMINOPHEN 325 MG PO TABS: 325.0000 mg | ORAL_TABLET | Freq: Four times a day (QID) | ORAL | Status: DC | PRN

## 2020-12-21 MED ORDER — HYDROCODONE-ACETAMINOPHEN 7.5-325 MG PO TABS
1.0000 | ORAL_TABLET | ORAL | Status: DC | PRN
  Administered 2020-12-22: 2 via ORAL
  Filled 2020-12-21: qty 2

## 2020-12-21 MED ORDER — METHYLPHENIDATE HCL 20 MG PO TABS
20.0000 mg | ORAL_TABLET | Freq: Two times a day (BID) | ORAL | Status: DC
  Administered 2020-12-22: 20 mg via ORAL
  Filled 2020-12-21: qty 1

## 2020-12-21 MED ORDER — DEXAMETHASONE SODIUM PHOSPHATE 10 MG/ML IJ SOLN
INTRAMUSCULAR | Status: AC
  Filled 2020-12-21: qty 1

## 2020-12-21 MED ORDER — PROPRANOLOL HCL 20 MG PO TABS
20.0000 mg | ORAL_TABLET | Freq: Every day | ORAL | Status: DC
  Administered 2020-12-21: 20 mg via ORAL
  Filled 2020-12-21: qty 1

## 2020-12-21 MED ORDER — ONDANSETRON HCL 4 MG/2ML IJ SOLN
INTRAMUSCULAR | Status: AC
  Filled 2020-12-21: qty 2

## 2020-12-21 MED ORDER — LIDOCAINE HCL (PF) 2 % IJ SOLN
INTRAMUSCULAR | Status: AC
  Filled 2020-12-21: qty 5

## 2020-12-21 MED ORDER — ONDANSETRON HCL 4 MG/2ML IJ SOLN
INTRAMUSCULAR | Status: DC | PRN
  Administered 2020-12-21: 4 mg via INTRAVENOUS

## 2020-12-21 MED ORDER — OXYCODONE HCL 5 MG/5ML PO SOLN: 5.0000 mg | Freq: Once | ORAL | Status: AC | PRN

## 2020-12-21 MED ORDER — PHENYLEPHRINE HCL (PRESSORS) 10 MG/ML IV SOLN
INTRAVENOUS | Status: AC
  Filled 2020-12-21: qty 1

## 2020-12-21 MED ORDER — FENTANYL CITRATE (PF) 100 MCG/2ML IJ SOLN
INTRAMUSCULAR | Status: DC | PRN
  Administered 2020-12-21 (×2): 50 ug via INTRAVENOUS

## 2020-12-21 MED ORDER — CLOPIDOGREL BISULFATE 75 MG PO TABS
75.0000 mg | ORAL_TABLET | ORAL | Status: DC
  Administered 2020-12-22: 75 mg via ORAL
  Filled 2020-12-21: qty 1

## 2020-12-21 MED ORDER — DIPHENHYDRAMINE HCL 12.5 MG/5ML PO ELIX
12.5000 mg | ORAL_SOLUTION | ORAL | Status: DC | PRN
  Filled 2020-12-21: qty 10

## 2020-12-21 MED ORDER — FENTANYL CITRATE (PF) 100 MCG/2ML IJ SOLN
25.0000 ug | INTRAMUSCULAR | Status: AC | PRN
  Administered 2020-12-21 (×4): 50 ug via INTRAVENOUS

## 2020-12-21 MED ORDER — ROCURONIUM BROMIDE 10 MG/ML (PF) SYRINGE
PREFILLED_SYRINGE | INTRAVENOUS | Status: AC
  Filled 2020-12-21: qty 10

## 2020-12-21 MED ORDER — HEMOSTATIC AGENTS (NO CHARGE) OPTIME
TOPICAL | Status: DC | PRN
  Administered 2020-12-21: 2 via TOPICAL

## 2020-12-21 MED ORDER — MIDAZOLAM HCL 2 MG/2ML IJ SOLN
INTRAMUSCULAR | Status: AC
  Filled 2020-12-21: qty 2

## 2020-12-21 MED ORDER — SODIUM CHLORIDE FLUSH 0.9 % IV SOLN
INTRAVENOUS | Status: AC
  Filled 2020-12-21: qty 40

## 2020-12-21 MED ORDER — PROPOFOL 10 MG/ML IV BOLUS
INTRAVENOUS | Status: DC | PRN
  Administered 2020-12-21: 150 mg via INTRAVENOUS

## 2020-12-21 MED ORDER — ROCURONIUM BROMIDE 100 MG/10ML IV SOLN
INTRAVENOUS | Status: DC | PRN
  Administered 2020-12-21: 50 mg via INTRAVENOUS
  Administered 2020-12-21: 10 mg via INTRAVENOUS

## 2020-12-21 MED ORDER — SODIUM CHLORIDE (PF) 0.9 % IJ SOLN: INTRAMUSCULAR | Status: DC | PRN

## 2020-12-21 SURGICAL SUPPLY — 62 items
BLADE SAGITTAL AGGR TOOTH XLG (BLADE) ×2 IMPLANT
BNDG COHESIVE 6X5 TAN ST LF (GAUZE/BANDAGES/DRESSINGS) ×6 IMPLANT
CANISTER WOUND CARE 500ML ATS (WOUND CARE) ×2 IMPLANT
CHLORAPREP W/TINT 26 (MISCELLANEOUS) ×2 IMPLANT
COVER BACK TABLE REUSABLE LG (DRAPES) ×2 IMPLANT
DRAPE 3/4 80X56 (DRAPES) ×6 IMPLANT
DRAPE C-ARM XRAY 36X54 (DRAPES) ×2 IMPLANT
DRAPE INCISE IOBAN 66X60 STRL (DRAPES) IMPLANT
DRAPE POUCH INSTRU U-SHP 10X18 (DRAPES) ×2 IMPLANT
DRESSING SURGICEL FIBRLLR 1X2 (HEMOSTASIS) ×2 IMPLANT
DRSG MEPILEX SACRM 8.7X9.8 (GAUZE/BANDAGES/DRESSINGS) IMPLANT
DRSG OPSITE POSTOP 4X8 (GAUZE/BANDAGES/DRESSINGS) IMPLANT
DRSG SURGICEL FIBRILLAR 1X2 (HEMOSTASIS) ×4
ELECT BLADE 6.5 EXT (BLADE) ×2 IMPLANT
ELECT REM PT RETURN 9FT ADLT (ELECTROSURGICAL) ×2
ELECTRODE REM PT RTRN 9FT ADLT (ELECTROSURGICAL) ×1 IMPLANT
GAUZE 4X4 16PLY ~~LOC~~+RFID DBL (SPONGE) ×2 IMPLANT
GLOVE SURG SYN 9.0  PF PI (GLOVE) ×2
GLOVE SURG SYN 9.0 PF PI (GLOVE) ×2 IMPLANT
GLOVE SURG UNDER POLY LF SZ9 (GLOVE) ×2 IMPLANT
GOWN SRG 2XL LVL 4 RGLN SLV (GOWNS) ×1 IMPLANT
GOWN STRL NON-REIN 2XL LVL4 (GOWNS) ×1
GOWN STRL REUS W/ TWL LRG LVL3 (GOWN DISPOSABLE) ×1 IMPLANT
GOWN STRL REUS W/TWL LRG LVL3 (GOWN DISPOSABLE) ×1
HEAD FEMORAL 28MM SZ S (Head) ×2 IMPLANT
HEMOVAC 400CC 10FR (MISCELLANEOUS) IMPLANT
HOLDER FOLEY CATH W/STRAP (MISCELLANEOUS) IMPLANT
HOOD PEEL AWAY FLYTE STAYCOOL (MISCELLANEOUS) ×2 IMPLANT
IRRIGATION SURGIPHOR STRL (IV SOLUTION) IMPLANT
KIT PREVENA INCISION MGT 13 (CANNISTER) ×2 IMPLANT
LINER DBL MOB SZ 0 52MM (Liner) ×2 IMPLANT
MANIFOLD NEPTUNE II (INSTRUMENTS) ×2 IMPLANT
MASTERLOC HIP LATERAL S3 (Hips) ×2 IMPLANT
MAT ABSORB  FLUID 56X50 GRAY (MISCELLANEOUS) ×1
MAT ABSORB FLUID 56X50 GRAY (MISCELLANEOUS) ×1 IMPLANT
NDL SAFETY ECLIPSE 18X1.5 (NEEDLE) ×1 IMPLANT
NEEDLE HYPO 18GX1.5 SHARP (NEEDLE) ×1
NEEDLE SPNL 20GX3.5 QUINCKE YW (NEEDLE) ×4 IMPLANT
NS IRRIG 1000ML POUR BTL (IV SOLUTION) ×2 IMPLANT
PACK HIP COMPR (MISCELLANEOUS) ×2 IMPLANT
SCALPEL PROTECTED #10 DISP (BLADE) ×4 IMPLANT
SHELL ACETABULAR SZ 52 DM (Shell) ×2 IMPLANT
SOL PREP PVP 2OZ (MISCELLANEOUS)
SOLUTION PREP PVP 2OZ (MISCELLANEOUS) IMPLANT
SPONGE DRAIN TRACH 4X4 STRL 2S (GAUZE/BANDAGES/DRESSINGS) IMPLANT
SPONGE T-LAP 18X18 ~~LOC~~+RFID (SPONGE) ×4 IMPLANT
STAPLER SKIN PROX 35W (STAPLE) ×2 IMPLANT
STRAP SAFETY 5IN WIDE (MISCELLANEOUS) ×2 IMPLANT
SUT DVC 2 QUILL PDO  T11 36X36 (SUTURE) ×1
SUT DVC 2 QUILL PDO T11 36X36 (SUTURE) ×1 IMPLANT
SUT SILK 0 (SUTURE) ×1
SUT SILK 0 30XBRD TIE 6 (SUTURE) ×1 IMPLANT
SUT V-LOC 90 ABS DVC 3-0 CL (SUTURE) ×2 IMPLANT
SUT VIC AB 1 CT1 36 (SUTURE) ×2 IMPLANT
SYR 20ML LL LF (SYRINGE) ×2 IMPLANT
SYR 30ML LL (SYRINGE) ×4 IMPLANT
SYR 50ML LL SCALE MARK (SYRINGE) ×2 IMPLANT
SYR BULB IRRIG 60ML STRL (SYRINGE) ×2 IMPLANT
TAPE MICROFOAM 4IN (TAPE) IMPLANT
TOWEL OR 17X26 4PK STRL BLUE (TOWEL DISPOSABLE) ×2 IMPLANT
TRAY FOLEY MTR SLVR 16FR STAT (SET/KITS/TRAYS/PACK) IMPLANT
WATER STERILE IRR 500ML POUR (IV SOLUTION) ×2 IMPLANT

## 2020-12-21 NOTE — Anesthesia Preprocedure Evaluation (Addendum)
Anesthesia Evaluation  Patient identified by MRN, date of birth, ID band Patient awake    Reviewed: Allergy & Precautions, NPO status , Patient's Chart, lab work & pertinent test results  Airway Mallampati: III  TM Distance: <3 FB Neck ROM: full    Dental  (+) Chipped, Poor Dentition, Missing, Implants   Pulmonary COPD, former smoker,    Pulmonary exam normal        Cardiovascular hypertension, Normal cardiovascular exam     Neuro/Psych  Headaches, PSYCHIATRIC DISORDERS    GI/Hepatic Neg liver ROS, hiatal hernia, PUD, GERD  Medicated and Controlled,  Endo/Other  negative endocrine ROS  Renal/GU      Musculoskeletal   Abdominal   Peds  Hematology negative hematology ROS (+)   Anesthesia Other Findings Past Medical History: No date: Arthritis No date: Depression No date: GERD (gastroesophageal reflux disease) No date: H/O ulcer disease No date: Headache     Comment:  MIGRAINES No date: Hypertension No date: SBO (small bowel obstruction) (HCC) No date: Wears dentures     Comment:  full upper  Past Surgical History: 12/29/2015: COLONOSCOPY WITH PROPOFOL; N/A     Comment:  Procedure: COLONOSCOPY WITH PROPOFOL;  Surgeon: [name], MD;  Location: [place];  Service:               Endoscopy;  Laterality: N/A; 11/29/2019: COLONOSCOPY WITH PROPOFOL; N/A     Comment:  Procedure: COLONOSCOPY WITH PROPOFOL;  Surgeon: [name], MD;  Location: [place];  Service:               Endoscopy;  Laterality: N/A;  priority 4 No date: ESOPHAGEAL DILATION     Comment:  stretching 01/17/2017: ESOPHAGEAL DILATION     Comment:  Procedure: ESOPHAGEAL DILATION;  Surgeon: [name],               MD;  Location: [place];  Service:               Gastroenterology;; 11/29/2016: ESOPHAGOGASTRODUODENOSCOPY (EGD) WITH PROPOFOL; N/A     Comment:  Procedure:  ESOPHAGOGASTRODUODENOSCOPY (EGD) WITH               PROPOFOL;  Surgeon: [name], MD;  Location: [facility]               ENDOSCOPY;  Service: Endoscopy;  Laterality: N/A; 01/17/2017: ESOPHAGOGASTRODUODENOSCOPY (EGD) WITH PROPOFOL; N/A     Comment:  Procedure: ESOPHAGOGASTRODUODENOSCOPY (EGD) WITH               PROPOFOL;  Surgeon: [name], MD;  Location: [place];  Service: Gastroenterology;  Laterality:               N/A; No date: HERNIA REPAIR; Right 01/14/2020: INGUINAL HERNIA REPAIR; Left     Comment:  Procedure: HERNIA REPAIR INGUINAL ADULT;  Surgeon:               Robert Bellow, MD;  Location: [facility];  Service:               General;  Laterality: Left; 12/29/2015: POLYPECTOMY; N/A     Comment:  Procedure: POLYPECTOMY;  Surgeon: [name], MD;  Location: [place];  Service: Endoscopy;                Laterality: N/A; 01/17/2017: POLYPECTOMY     Comment:  Procedure: POLYPECTOMY;  Surgeon: [name], MD;                Location: [place];  Service:               Gastroenterology;; 11/29/2019: POLYPECTOMY; N/A     Comment:  Procedure: POLYPECTOMY;  Surgeon: [name], MD;                Location: [place];  Service: Endoscopy;                Laterality: N/A; 09/20/2009: SHOULDER SURGERY; Right  BMI    Body Mass Index: 31.81 kg/m      Reproductive/Obstetrics negative OB ROS                            Anesthesia Physical Anesthesia Plan  ASA: 3  Anesthesia Plan: General ETT   Post-op Pain Management:    Induction: Intravenous  PONV Risk Score and Plan: Ondansetron, Dexamethasone, Midazolam and Treatment may vary due to age or medical condition  Airway Management Planned: Oral ETT  Additional Equipment:   Intra-op Plan:   Post-operative Plan: Extubation in OR  Informed Consent: I have reviewed the patients History and Physical, chart, labs and  discussed the procedure including the risks, benefits and alternatives for the proposed anesthesia with the patient or authorized representative who has indicated his/her understanding and acceptance.     Dental Advisory Given  Plan Discussed with: Anesthesiologist, CRNA and Surgeon  Anesthesia Plan Comments: (Patient declines spinal and requests GA  Patient consented for risks of anesthesia including but not limited to:  - adverse reactions to medications - damage to eyes, teeth, lips or other oral mucosa - nerve damage due to positioning  - sore throat or hoarseness - Damage to heart, brain, nerves, lungs, other parts of body or loss of life  Patient voiced understanding.)       Anesthesia Quick Evaluation

## 2020-12-21 NOTE — Anesthesia Procedure Notes (Signed)
Procedure Name: Intubation Date/Time: 12/21/2020 7:24 AM Performed by: Lowry Bowl, CRNA Pre-anesthesia Checklist: Patient identified, Emergency Drugs available, Suction available and Patient being monitored Patient Re-evaluated:Patient Re-evaluated prior to induction Oxygen Delivery Method: Circle system utilized Preoxygenation: Pre-oxygenation with 100% oxygen Induction Type: IV induction Ventilation: Mask ventilation without difficulty Laryngoscope Size: McGraph and 4 Grade View: Grade I Tube type: Oral Tube size: 7.5 mm Number of attempts: 1 Airway Equipment and Method: Stylet and Video-laryngoscopy (Elective use of videoscope) Placement Confirmation: ETT inserted through vocal cords under direct vision, positive ETCO2 and breath sounds checked- equal and bilateral Secured at: 21 cm Tube secured with: Tape Dental Injury: Teeth and Oropharynx as per pre-operative assessment

## 2020-12-21 NOTE — Anesthesia Postprocedure Evaluation (Signed)
Anesthesia Post Note  Patient: Alan Trevino  Procedure(s) Performed: TOTAL HIP ARTHROPLASTY ANTERIOR APPROACH (Right: Hip)  Patient location during evaluation: PACU Anesthesia Type: General Level of consciousness: awake and alert Pain management: pain level controlled Vital Signs Assessment: post-procedure vital signs reviewed and stable Respiratory status: spontaneous breathing, nonlabored ventilation, respiratory function stable and patient connected to nasal cannula oxygen Cardiovascular status: blood pressure returned to baseline and stable Postop Assessment: no apparent nausea or vomiting Anesthetic complications: no   No notable events documented.   Last Vitals:  Vitals:   12/21/20 0915 12/21/20 0930  BP: 115/72 137/73  Pulse: 72 72  Resp: 14 15  Temp:    SpO2: 95% 95%    Last Pain:  Vitals:   12/21/20 0930  PainSc: 7                  Alan Trevino

## 2020-12-21 NOTE — Op Note (Signed)
12/21/2020  8:58 AM  PATIENT:  Alan Trevino  60 y.o. male  PRE-OPERATIVE DIAGNOSIS:  Primary osteoarthritis of right hip M16.11  POST-OPERATIVE DIAGNOSIS:  Primary osteoarthritis of right hip  PROCEDURE:  Procedure(s): TOTAL HIP ARTHROPLASTY ANTERIOR APPROACH (Right)  SURGEON: Laurene Footman, MD  ASSISTANTS: None  ANESTHESIA:   general  EBL:  Total I/O In: 900 [I.V.:700; IV Piggyback:200] Out: 50 [Blood:50]  BLOOD ADMINISTERED:none  DRAINS: Incisional wound VAC  LOCAL MEDICATIONS USED:  MARCAINE    and OTHER Exparel  SPECIMEN: Right femoral head  DISPOSITION OF SPECIMEN:  PATHOLOGY  COUNTS:  YES  TOURNIQUET:  * No tourniquets in log *  IMPLANTS: Medacta Masterloc 3 lateralized stem with 52 mm and Mpact DM cup and liner with ceramic S 28 mm head  DICTATION: .Dragon Dictation   The patient was brought to the operating room and after general anesthesia was obtained patient was placed on the operative table with the ipsilateral foot into the Medacta attachment, contralateral leg on a well-padded table. C-arm was brought in and preop template x-ray taken. After prepping and draping in usual sterile fashion appropriate patient identification and timeout procedures were completed. Anterior approach to the hip was obtained and centered over the greater trochanter and TFL muscle. The subcutaneous tissue was incised hemostasis being achieved by electrocautery. TFL fascia was incised and the muscle retracted laterally deep retractor placed. The lateral femoral circumflex vessels were identified and ligated. The anterior capsule was exposed and a capsulotomy performed. The neck was identified and a femoral neck cut carried out with a saw. The head was removed without difficulty and showed sclerotic femoral head and acetabulum. Reaming was carried out to 52 mm and a 52 mm cup trial gave appropriate tightness to the acetabular component a 52 DM cup was impacted into position. The leg was  then externally rotated and ischiofemoral and pubofemoral releases carried out. The femur was sequentially broached to a size 3, size 3 lateralized stem with S head trials were placed and the final components chosen. The 3 lateralized stem was inserted along with a ceramic S 28 mm head and 52 mm liner. The hip was reduced and was stable the wound was thoroughly irrigated with fibrillar placed along the posterior capsule and medial neck. The deep fascia ws closed using a heavy Quill after infiltration of 30 cc of quarter percent Sensorcaine with epinephrine diluted with Exparel throughout the case .3-0 V-loc to close the skin with skin staples.  Incisional wound VAC applied and patient was sent to recovery in stable condition.   PLAN OF CARE: Admit for overnight observation

## 2020-12-21 NOTE — Evaluation (Signed)
Physical Therapy Evaluation Patient Details Name: JIM MYNHIER MRN: [id number] DOB: [birth year] Today's Date: 12/21/2020   History of Present Illness  Pt is admitted for R THR. HIstory includes anemia, depression, DDD, HLD, and HTN. Pt is POD 0 at time of evaluation.  Clinical Impression  Pt is a pleasant 60 year old male who was admitted for R THR. Pt performs bed mobility with min assist and transfers/ambulation with cga and RW. Pt demonstrates deficits with strength/mobility/pain. Appears very motivated to participate. Educated on Volin status. Would benefit from skilled PT to address above deficits and promote optimal return to PLOF. Recommend transition to [place] upon discharge from acute hospitalization.     Follow Up Recommendations Home health PT    Equipment Recommendations  Rolling walker with 5" wheels    Recommendations for Other Services       Precautions / Restrictions Precautions Precautions: Anterior Hip Precaution Booklet Issued: No Restrictions Weight Bearing Restrictions: Yes RLE Weight Bearing: Weight bearing as tolerated      Mobility  Bed Mobility Overal bed mobility: Needs Assistance Bed Mobility: Supine to Sit     Supine to sit: Min assist     General bed mobility comments: assist for R LE guidance over to EOB. Heavy cues for trunkal elevation. once seated, upright posture noted    Transfers Overall transfer level: Needs assistance Equipment used: Rolling walker (2 wheeled) Transfers: Sit to/from Stand Sit to Stand: Min guard         General transfer comment: cues for hand placement. Once standing, upright posture noted  Ambulation/Gait Ambulation/Gait assistance: Min guard Gait Distance (Feet): 40 Feet Assistive device: Rolling walker (2 wheeled) Gait Pattern/deviations: Step-to pattern     General Gait Details: slow antalgic gait pattern with step to sequencing. Pt with upright posture. No dizziness  Stairs             Wheelchair Mobility    Modified Rankin (Stroke Patients Only)       Balance Overall balance assessment: Needs assistance Sitting-balance support: Feet supported Sitting balance-Leahy Scale: Good     Standing balance support: Bilateral upper extremity supported Standing balance-Leahy Scale: Fair Standing balance comment: use of RW                             Pertinent Vitals/Pain Pain Assessment: 0-10 Pain Score: 5  Pain Location: R ant hip Pain Descriptors / Indicators: Operative site guarding Pain Intervention(s): Limited activity within patient's tolerance;Premedicated before session;Repositioned;Ice applied    Home Living Family/patient expects to be discharged to:: Private residence Living Arrangements: Spouse/significant other Available Help at Discharge: Family Type of Home: House Home Access: Ramped entrance     Wise: One level New Home: [place] - single point      Prior Function Level of Independence: Independent with assistive device(s)         Comments: occasionally uses SPC. Reports no falls     Hand Dominance        Extremity/Trunk Assessment   Upper Extremity Assessment Upper Extremity Assessment: Overall WFL for tasks assessed    Lower Extremity Assessment Lower Extremity Assessment: Generalized weakness (R LE grossly 3/5; L LE grossly 5/5)       Communication   Communication: No difficulties  Cognition Arousal/Alertness: Awake/alert Behavior During Therapy: WFL for tasks assessed/performed Overall Cognitive Status: Within Functional Limits for tasks assessed  General Comments      Exercises Other Exercises Other Exercises: supine ther-ex performed on R LE including AP, quad sets, glut sets, SAQ, and hip abd/add. min assist required. 10 reps Other Exercises: ambulated to bathroom. Able to stand to void, however unsuccessful. CGA   Assessment/Plan     PT Assessment Patient needs continued PT services  PT Problem List Decreased strength;Decreased balance;Decreased mobility;Decreased knowledge of use of DME;Pain       PT Treatment Interventions DME instruction;Gait training;Stair training;Therapeutic exercise    PT Goals (Current goals can be found in the Care Plan section)  Acute Rehab PT Goals Patient Stated Goal: to go home PT Goal Formulation: With patient Time For Goal Achievement: 01/04/21 Potential to Achieve Goals: Good    Frequency BID   Barriers to discharge        Co-evaluation               AM-PAC PT "6 Clicks" Mobility  Outcome Measure Help needed turning from your back to your side while in a flat bed without using bedrails?: A Little Help needed moving from lying on your back to sitting on the side of a flat bed without using bedrails?: A Little Help needed moving to and from a bed to a chair (including a wheelchair)?: A Little Help needed standing up from a chair using your arms (e.g., wheelchair or bedside chair)?: A Little Help needed to walk in hospital room?: A Little Help needed climbing 3-5 steps with a railing? : A Lot 6 Click Score: 17    End of Session Equipment Utilized During Treatment: Gait belt Activity Tolerance: Patient tolerated treatment well Patient left: in bed;with SCD's reapplied Nurse Communication: Mobility status PT Visit Diagnosis: Muscle weakness (generalized) (M62.81);Difficulty in walking, not elsewhere classified (R26.2);Pain Pain - Right/Left: Right Pain - part of body: Hip    Time: [id number] PT Time Calculation (min) (ACUTE ONLY): 32 min   Charges:   PT Evaluation $PT Eval Low Complexity: 1 Low PT Treatments $Therapeutic Exercise: 8-22 mins        Greggory Stallion, PT, DPT [phone]   Vihana Kydd 12/21/2020, 3:39 PM

## 2020-12-21 NOTE — Transfer of Care (Signed)
Immediate Anesthesia Transfer of Care Note  Patient: Alan Trevino  Procedure(s) Performed: TOTAL HIP ARTHROPLASTY ANTERIOR APPROACH (Right: Hip)  Patient Location: PACU  Anesthesia Type:General  Level of Consciousness: drowsy  Airway & Oxygen Therapy: Patient Spontanous Breathing and Patient connected to face mask oxygen  Post-op Assessment: Report given to RN and Post -op Vital signs reviewed and stable  Post vital signs: Reviewed and stable  Last Vitals:  Vitals Value Taken Time  BP 158/73 12/21/20 0847  Temp    Pulse 66 12/21/20 0849  Resp 15 12/21/20 0849  SpO2 100 % 12/21/20 0849  Vitals shown include unvalidated device data.  Last Pain:  Vitals:   12/21/20 0619  PainSc: 4          Complications: No notable events documented.

## 2020-12-21 NOTE — H&P (Signed)
Chief Complaint  Patient presents with   Back Pain   Alan Trevino is a 60 y.o. male who presents today for evaluation of back pain x3 months. Points to his buttocks, groin and lateral hip. Pain radiates down into the knee but if he walks for long periods of time his pain will go into the anterior lateral and posterior aspect of the lower leg. He describes a burning sensation in his groin but no numbness or tingling throughout the leg. Denies any lumbar pain. Denies any trauma or injury. Symptoms began gradually 3 months ago and have increased. He kept thinking this was good to get better but it has not, and his only increase. He is walking with a limp. Any activity involving hip flexion is painful and difficult. 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 Unable to take NSAIDs due to gastric ulcers.  Patient is not diabetic. No history of blood clots.  Past Medical History: Past Medical History:  Diagnosis Date   Anemia   DDD (degenerative disc disease), lumbosacral   Depression   Gastric ulcer 01/17/2017   GERD (gastroesophageal reflux disease)   Hiatal hernia   Hyperlipidemia   Hypertension   SBO (small bowel obstruction) (CMS-HCC)   Past Surgical History: Past Surgical History:  Procedure Laterality Date   EPIGASTRIC HERNIA REPAIR   HERNIA REPAIR   polypectomy 12/29/2015  Tubular adenoma x3, [name], MD   polypectomy 12/29/2015   REPAIR INGUINAL HERNIA Left 01/14/2020   SHOULDER FUSION   shoulder surgery 09/2009   Past Family History: Family History  Problem Relation Age of Onset   Cancer Mother   Ovarian cancer Mother   Coronary Artery Disease (Blocked arteries around heart) Father   Breast cancer Neg Hx   Colon cancer Neg Hx   Medications: Current Outpatient Medications Ordered in Epic  Medication Sig Dispense Refill   acetaminophen (TYLENOL) 500 MG tablet Take by mouth   amLODIPine-atorvastatin  (CADUET) 5-20 mg tablet Take 1 tablet by mouth once daily   buPROPion (WELLBUTRIN XL) 300 MG XL tablet Take 300 mg by mouth once daily   busPIRone (BUSPAR) 15 MG tablet Take 15 mg by mouth 2 (two) times daily   clopidogreL (PLAVIX) 75 mg tablet Take '75mg'$  daily on MWF. 36 tablet 1   cyclobenzaprine (FLEXERIL) 5 MG tablet Take 5 mg by mouth 3 (three) times daily as needed for Muscle spasms   HYDROcodone-acetaminophen (NORCO) 5-325 mg tablet Take by mouth every 4 (four) hours as needed (Patient not taking: Reported on 08/25/2020)   methylphenidate HCl (RITALIN) 20 MG tablet Take 20 mg by mouth 2 (two) times daily   propranoloL (INDERAL) 20 MG tablet Take 2 tablets ('40mg'$ ) in the morning and 1 tablet ('20mg'$ ) at night. 90 tablet 5   SUMAtriptan (IMITREX) 100 MG tablet Take 0.5 to 1 tab at headache onset, can repeat once if needed in 2 hours. No more than 2 tabs in 24 hours 20 tablet 1   No current Epic-ordered facility-administered medications on file.   Allergies: No Known Allergies   Review of Systems:  A comprehensive 14 point ROS was performed, reviewed by me today, and the pertinent orthopaedic findings are documented in the HPI.  Exam: Wt 78.9 kg (174 lb)  BMI 30.82 kg/m  General:  Well developed, well nourished 60 y.o. male in no apparent distress. Normal affect. Normal communication. Patient answers questions appropriately. Antalgic  gait with no assistive devices.  HEENT: Head normocephalic, atraumatic, PERRL.   Abdomen: Soft, non tender, non distended, Bowel sounds present.  Heart: Examination of the heart reveals regular, rate, and rhythm. There is no murmur noted on ascultation. There is a normal apical pulse.  Lungs: Lungs are clear to auscultation. There is no wheeze, rhonchi, or crackles. There is normal expansion of bilateral chest walls.   Lumbar Spine: Examination of the lumbar spine reveals no bony abnormality, no edema, and no ecchymosis. There is no step off. The  patient has full range of motion of the lumbar spine with flexion and extension. The patient has normal lateral bend and rotation. The patient has no pain with range of motion activities. The patient has a negative axial load test, and a negative rotational Waddell test. The patient is non tender along the spinous process. The patient is non tender along the paravertebral muscles, with no muscle spasms. The patient is non tender along the iliac crest. The patient is non tender in the sciatic notch. The patient is non tender along the Sacroiliac joint. There is no Coccyx joint tenderness.   right lower Extremities: Examination of the lower extremities reveals no bony abnormality, no edema, and no ecchymosis. Full range of motion of the knees and ankles, left hip with normal range of motion but the right hip with 15 degrees of internal rotation which causes moderate to severe groin lateral hip and buttocks pain. The patient is non tender along the greater trochanter region. The patient has a negative Bevelyn Buckles' test bilaterally. There is normal skin warmth. There is normal capillary refill bilaterally.   Neurologic: The patient has a negative straight leg raise. The patient has normal muscle strength testing for the quadriceps, calves, ankle dorsiflexion, ankle plantarflexion, and extensor hallicus longus. The patient has sensation that is intact to light touch. The deep tendon reflexes are normal at the patella and achilles. No clonus is noted.   Imaging: AP lateral views of the lumbar spine are ordered interpreted by me in the office today. Impression: Patient has moderate lower lumbar spondylosis with mild to space degeneration with endplate vertebral body spurring. No significant spondylolisthesis. No compression fractures.  Imaging: AP pelvis and lateral view of the right hip ordered interpreted by me in the office today. Impression: Patient has advanced right hip osteoarthritis with significant joint  space narrowing in the superior and central acetabulum with large superior acetabular spur with advanced sclerotic changes throughout the superior acetabulum as well as the inferior acetabulum. Subchondral cyst formation present. No evidence of acute bony abnormality or abnormal bony lesions. Patient noted to have mild to moderate left hip osteoarthritis  Impression: Primary osteoarthritis of right hip [M16.11] Primary osteoarthritis of right hip (primary encounter diagnosis) Lumbar spondylosis  Plan:  38. 60 year old male with advanced right hip osteoarthritis. Pain in the groin, lateral hip buttocks radiating down into the knee. Pain exacerbated with activities involving hip flexion. X-rays show advanced right hip osteoarthritis. Discussed risks, benefits, complications of a right total hip arthroplasty with the patient. He is given information on this. We reviewed all information. Risks, benefits, complications of a right total hip arthroplasty have been discussed with the patient. Patient has agreed and consented procedure with Dr. Hessie Knows.  This note was generated in part with voice recognition software and I apologize for any typographical errors that were not detected and corrected.  Feliberto Gottron MPA-C   Electronically signed by Feliberto Gottron, [place] at 11/27/2020 10:20  AM EDT  Reviewed  H+P. No changes noted.

## 2020-12-22 ENCOUNTER — Encounter: Payer: Self-pay | Admitting: Orthopedic Surgery

## 2020-12-22 DIAGNOSIS — M1611 Unilateral primary osteoarthritis, right hip: Secondary | ICD-10-CM | POA: Diagnosis not present

## 2020-12-22 LAB — CBC
HCT: 35.2 % — ABNORMAL LOW (ref 39.0–52.0)
Hemoglobin: 11.8 g/dL — ABNORMAL LOW (ref 13.0–17.0)
MCH: 31.2 pg (ref 26.0–34.0)
MCHC: 33.5 g/dL (ref 30.0–36.0)
MCV: 93.1 fL (ref 80.0–100.0)
Platelets: 186 10*3/uL (ref 150–400)
RBC: 3.78 MIL/uL — ABNORMAL LOW (ref 4.22–5.81)
RDW: 11.9 % (ref 11.5–15.5)
WBC: 12.7 10*3/uL — ABNORMAL HIGH (ref 4.0–10.5)
nRBC: 0 % (ref 0.0–0.2)

## 2020-12-22 LAB — BASIC METABOLIC PANEL
Anion gap: 6 (ref 5–15)
BUN: 16 mg/dL (ref 8–23)
CO2: 27 mmol/L (ref 22–32)
Calcium: 8.7 mg/dL — ABNORMAL LOW (ref 8.9–10.3)
Chloride: 102 mmol/L (ref 98–111)
Creatinine, Ser: 1.2 mg/dL (ref 0.61–1.24)
GFR, Estimated: >60 mL/min (ref >60)
Glucose, Bld: 117 mg/dL — ABNORMAL HIGH (ref 70–99)
Potassium: 4.4 mmol/L (ref 3.5–5.1)
Sodium: 135 mmol/L (ref 135–145)

## 2020-12-22 LAB — SURGICAL PATHOLOGY

## 2020-12-22 MED ORDER — DOCUSATE SODIUM 100 MG PO CAPS: 100.0000 mg | ORAL_CAPSULE | Freq: Two times a day (BID) | ORAL | 0 refills | Status: DC

## 2020-12-22 MED ORDER — HYDROCODONE-ACETAMINOPHEN 5-325 MG PO TABS: 1.0000 | ORAL_TABLET | ORAL | 0 refills | Status: DC | PRN

## 2020-12-22 MED ORDER — TRAMADOL HCL 50 MG PO TABS: 50.0000 mg | ORAL_TABLET | Freq: Four times a day (QID) | ORAL | 0 refills | Status: DC | PRN

## 2020-12-22 NOTE — Discharge Summary (Signed)
Physician Discharge Summary  Patient ID: Alan Trevino MRN: [id number] DOB/AGE: 1961/04/08 60 y.o.  Admit date: 12/21/2020 Discharge date: 12/22/2020  Admission Diagnoses:  Status post total hip replacement, right [Z96.641]   Discharge Diagnoses: 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   Personal history of colonic polyps    Acute peptic ulcer without hemorrhage and perforation    Hiatal hernia    Esophagogastric ulcer    Stricture and stenosis of esophagus    GIB (gastrointestinal bleeding) 11/30/2016   Hematemesis without nausea    Acute esophagogastric ulcer    Upper GI bleed 11/28/2016   Acute recurrent maxillary sinusitis 09/07/2016   Special screening for malignant neoplasms, colon    Benign neoplasm of ascending colon    Benign neoplasm of transverse colon    Benign neoplasm of descending colon    Rectal polyp    Lumbar disc disease 11/03/2015   Iron deficiency anemia 08/25/2014   Acute gastrointestinal bleeding 08/25/2014   Acute back pain with sciatica 08/25/2014   Routine general medical examination at a health care facility 08/25/2014   DDD (degenerative disc disease), lumbosacral 08/25/2014   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 male who was admitted 12/21/2020 with a diagnosis of right hip osteoarthritis and went to the operating room on 12/21/2020 and underwent the above named procedures.    Surgeries: Procedure(s): TOTAL  HIP ARTHROPLASTY ANTERIOR APPROACH on 12/21/2020 Patient tolerated the surgery well. Taken to PACU where she was stabilized and then transferred to the orthopedic floor.  Started on aspirin and plavix. Foot pumps applied bilaterally at 80 mm. Heels elevated on bed with rolled towels. No evidence of DVT. Negative Homan. Physical therapy started on day #1 for gait training and transfer. OT started day #1 for ADL and assisted devices.  Patient's foley was d/c on day #1. Patient's IV  was d/c on day #1.  On post op day #1patient was stable and ready for discharge to home with HHPT.    He was given perioperative antibiotics:  Anti-infectives (From admission, onward)    Start     Dose/Rate Route Frequency Ordered Stop   12/21/20 1330  ceFAZolin (ANCEF) IVPB 2g/100 mL premix        2 g 200 mL/hr over 30 Minutes Intravenous Every 6 hours 12/21/20 0948 12/21/20 2231   12/21/20 0616  ceFAZolin (ANCEF) 2-4 GM/100ML-% IVPB       Note to Pharmacy: Sylvester Harder   : cabinet override      12/21/20 0616 12/21/20 2231   12/21/20 0600  ceFAZolin (ANCEF) IVPB 2g/100 mL premix        2 g 200 mL/hr over 30 Minutes Intravenous On call to O.R. 12/20/20 2245 12/21/20 0730     .  He was given sequential compression devices, early ambulation, and aspirin plavix for DVT prophylaxis.  He benefited maximally from the hospital  stay and there were no complications.    Recent vital signs:  Vitals:   12/22/20 0613 12/22/20 0813  BP: 130/82 (!) 145/84  Pulse: 66 60  Resp: 18 16  Temp: 98.7 F (37.1 C) 97.8 F (36.6 C)  SpO2:  95%    Recent laboratory studies:  Lab Results  Component Value Date   HGB 11.8 (L) 12/22/2020   HGB 15.2 12/12/2020   HGB 11.6 (L) 12/09/2016   Lab Results  Component Value Date   WBC 12.7 (H) 12/22/2020   PLT 186 12/22/2020   No results found for: INR Lab Results  Component Value Date   NA 135 12/22/2020   K 4.4 12/22/2020   CL 102 12/22/2020   CO2 27 12/22/2020    BUN 16 12/22/2020   CREATININE 1.20 12/22/2020   GLUCOSE 117 (H) 12/22/2020    Discharge Medications:   Allergies as of 12/22/2020   No Known Allergies      Medication List     TAKE these medications    acetaminophen 500 MG tablet Commonly known as: TYLENOL Take 1,000 mg by mouth every 6 (six) hours as needed for moderate pain.   amLODipine-benazepril 10-20 MG capsule Commonly known as: LOTREL Take 1 capsule by mouth daily.   atorvastatin 10 MG tablet Commonly known as: LIPITOR TAKE 1 TABLET BY MOUTH EVERY DAY What changed:  how much to take how to take this when to take this   buPROPion 300 MG 24 hr tablet Commonly known as: WELLBUTRIN XL Take 300 mg by mouth daily.   busPIRone 30 MG tablet Commonly known as: BUSPAR Take 30 mg by mouth 2 (two) times daily.   clopidogrel 75 MG tablet Commonly known as: PLAVIX Take 1 tablet (75 mg total) by mouth every Monday, Wednesday, and Friday.   cyclobenzaprine 5 MG tablet Commonly known as: FLEXERIL Take 2 tablets (10 mg total) by mouth at bedtime. What changed:  when to take this reasons to take this   docusate sodium 100 MG capsule Commonly known as: COLACE Take 1 capsule (100 mg total) by mouth 2 (two) times daily.   HYDROcodone-acetaminophen 5-325 MG tablet Commonly known as: NORCO/VICODIN Take 1-2 tablets by mouth every 4 (four) hours as needed for moderate pain (pain score 4-6).   methylphenidate 20 MG tablet Commonly known as: RITALIN Take 20 mg by mouth 2 (two) times daily with breakfast and lunch.   propranolol 20 MG tablet Commonly known as: INDERAL Take 20-40 mg by mouth See admin instructions. 40 mg in the morning, 20 mg in the evening   traMADol 50 MG tablet Commonly known as: ULTRAM Take 1 tablet (50 mg total) by mouth every 6 (six) hours as needed. What changed: reasons to take this               Durable Medical Equipment  (From admission, onward)           Start     Ordered    12/21/20 1309  DME Walker rolling  Once       Question Answer Comment  Walker: With 5 Inch Wheels   Patient needs a walker to treat with the following condition Status post total hip replacement, right      12/21/20 1308   12/21/20 1309  DME 3 n 1  Once        12/21/20 1308   12/21/20 1309  DME Bedside commode  Once       Question:  Patient needs  a bedside commode to treat with the following condition  Answer:  Status post total hip replacement, right   12/21/20 1308            Diagnostic Studies: DG HIP OPERATIVE UNILAT W OR W/O PELVIS RIGHT  Result Date: 12/21/2020 CLINICAL DATA:  Surgery, elective Z41.9 (ICD-10-CM) EXAM: OPERATIVE RIGHT HIP (WITH PELVIS IF PERFORMED)  VIEWS TECHNIQUE: Fluoroscopic spot image(s) were submitted for interpretation post-operatively. COMPARISON:  None. FINDINGS: Fluoro time: 18 seconds. Multiple images and cine clips demonstrate changes associated with right total hip arthroplasty. No unexpected findings. IMPRESSION: Intraoperative fluoroscopy during right total hip arthroplasty. Electronically Signed   By: Margaretha Sheffield M.D.   On: 12/21/2020 11:40   DG HIP UNILAT W OR W/O PELVIS 2-3 VIEWS RIGHT  Result Date: 12/21/2020 CLINICAL DATA:  Postop right hip replacement EXAM: DG HIP (WITH OR WITHOUT PELVIS) 2-3V RIGHT COMPARISON:  None. FINDINGS: Two views study shows right total hip replacement. Surgical drain overlies the operative site. No evidence for immediate hardware complication. IMPRESSION: Right total hip replacement without evidence for immediate hardware complications. Electronically Signed   By: Misty Stanley M.D.   On: 12/21/2020 10:05    Disposition:      Follow-up Information     Duanne Guess, PA-C Follow up in 2 week(s).   Specialties: Orthopedic Surgery, Emergency Medicine Contact information: [place] [address] [phone]                  Signed: Feliberto Gottron 12/22/2020, 12:05  PM

## 2020-12-22 NOTE — Discharge Instructions (Signed)

## 2020-12-22 NOTE — Progress Notes (Signed)
   Subjective: 1 Day Post-Op Procedure(s) (LRB): TOTAL HIP ARTHROPLASTY ANTERIOR APPROACH (Right) Patient reports pain as 4 on 0-10 scale.   Patient is well, and has had no acute complaints or problems Denies any CP, SOB, ABD pain. We will continue therapy today.  Plan is to go Home after hospital stay.  Objective: Vital signs in last 24 hours: Temp:  [97.8 F (36.6 C)-98.8 F (37.1 C)] 97.8 F (36.6 C) (09/02 0813) Pulse Rate:  [60-88] 60 (09/02 0813) Resp:  [12-20] 16 (09/02 0813) BP: (115-158)/(63-87) 145/84 (09/02 0813) SpO2:  [94 %-100 %] 95 % (09/02 0813)  Intake/Output from previous day: 09/01 0701 - 09/02 0700 In: 2290 [P.O.:360; I.V.:1580; IV Piggyback:350] Out: 2025 [Urine:1975; Blood:50] Intake/Output this shift: No intake/output data recorded.  Recent Labs    12/22/20 0423  HGB 11.8*   Recent Labs    12/22/20 0423  WBC 12.7*  RBC 3.78*  HCT 35.2*  PLT 186   Recent Labs    12/22/20 0423  NA 135  K 4.4  CL 102  CO2 27  BUN 16  CREATININE 1.20  GLUCOSE 117*  CALCIUM 8.7*   No results for input(s): LABPT, INR in the last 72 hours.  EXAM General - Patient is Alert, Appropriate, and Oriented Extremity - Neurovascular intact Sensation intact distally Intact pulses distally Dorsiflexion/Plantar flexion intact No cellulitis present Compartment soft Dressing - dressing C/D/I and no drainage Motor Function - intact, moving foot and toes well on exam.   Past Medical History:  Diagnosis Date   Arthritis    Depression    GERD (gastroesophageal reflux disease)    H/O ulcer disease    Headache    MIGRAINES   Hypertension    SBO (small bowel obstruction) (HCC)    Wears dentures    full upper    Assessment/Plan:   1 Day Post-Op Procedure(s) (LRB): TOTAL HIP ARTHROPLASTY ANTERIOR APPROACH (Right) Active Problems:   Status post total hip replacement, right  Estimated body mass index is 31.81 kg/m as calculated from the following:    Height as of this encounter: '5\' 2"'$  (1.575 m).   Weight as of this encounter: 78.9 kg. Advance diet Up with therapy Vital signs stable Labs are stable Pain well controlled Work on bowel movement Care management to assist with discharge to home with home health PT.  Probable discharge to home today pending completion of PT goals  DVT Prophylaxis - Aspirin, TED hose, and Plavix, SCDs Weight-Bearing as tolerated to right leg   T. Rachelle Hora, PA-C [place] 12/22/2020, 8:16 AM

## 2020-12-22 NOTE — Progress Notes (Signed)
Physical Therapy Treatment Patient Details Name: Alan Trevino MRN: [id number] DOB: 1960/07/11 Today's Date: 12/22/2020    History of Present Illness Pt is admitted for R THR. HIstory includes anemia, depression, DDD, HLD, and HTN.    PT Comments    Pt seen this am, 5/10 right buttocks discomfort from ms spasm reduced with mobility.  Pt currently able to maneuver in and out of bed without assistance, utilizes RW for 100+ft ambulation with supervision, decreased stance time on R LE due to spasm. Pt and spouse educated on proper technique for car transfers and hip precautions.  Continue to recommend Home with HHPT.   Follow Up Recommendations  Home health PT     Equipment Recommendations  Rolling walker with 5" wheels    Recommendations for Other Services       Precautions / Restrictions Precautions Precautions: Anterior Hip Precaution Booklet Issued: Yes (comment) Precaution Comments:  (Pt educated on mobility precautions) Restrictions Weight Bearing Restrictions: Yes RLE Weight Bearing: Weight bearing as tolerated    Mobility  Bed Mobility Overal bed mobility: Needs Assistance Bed Mobility: Supine to Sit     Supine to sit: Supervision     General bed mobility comments: seated EOB on arrival    Transfers Overall transfer level: Needs assistance Equipment used: Rolling walker (2 wheeled) Transfers: Sit to/from Stand Sit to Stand: Supervision         General transfer comment:  (Pt stated he has been ambulating short distances in the room with use of RW)  Ambulation/Gait Ambulation/Gait assistance: Supervision Gait Distance (Feet): 100 Feet Assistive device: Rolling walker (2 wheeled) Gait Pattern/deviations: Step-to pattern         Stairs Stairs:  (Pt has a ramp at home)           Wheelchair Mobility    Modified Rankin (Stroke Patients Only)       Balance Overall balance assessment: Needs assistance Sitting-balance support: Feet  supported Sitting balance-Leahy Scale: Good     Standing balance support: No upper extremity supported;During functional activity Standing balance-Leahy Scale: Good                              Cognition Arousal/Alertness: Awake/alert Behavior During Therapy: WFL for tasks assessed/performed Overall Cognitive Status: Within Functional Limits for tasks assessed                                        Exercises Other Exercises Other Exercises: Pt educated re: OT role, DME recs, d/c recs, falls prevention, ECS, home/routines modifications, adapted dressing/bathing strategies Other Exercises: LBD, hand washing, tooth brushing, hair brushing, sit<>stand, sitting/standing balance/tolerance, ~40 ft mobility    General Comments General comments (skin integrity, edema, etc.):  (Pt educated on car transfers and safe technique with good understanding.)      Pertinent Vitals/Pain Pain Assessment: No/denies pain Pain Score: 5  Pain Location:  (R buttocks) Pain Descriptors / Indicators: Spasm Pain Intervention(s): Monitored during session (manual soft tissue/trigger point massage)    Home Living Family/patient expects to be discharged to:: Private residence Living Arrangements: Spouse/significant other Available Help at Discharge: Family Type of Home: House Home Access: Ramped entrance   Home Layout: One level Home Equipment: Cane - single point      Prior Function Level of Independence: Independent with assistive device(s)      Comments:  occasionally uses SPC. Reports no falls   PT Goals (current goals can now be found in the care plan section) Acute Rehab PT Goals Patient Stated Goal: to go home    Frequency    BID      PT Plan      Co-evaluation              AM-PAC PT "6 Clicks" Mobility   Outcome Measure  Help needed turning from your back to your side while in a flat bed without using bedrails?: A Little Help needed moving from  lying on your back to sitting on the side of a flat bed without using bedrails?: A Little Help needed moving to and from a bed to a chair (including a wheelchair)?: A Little Help needed standing up from a chair using your arms (e.g., wheelchair or bedside chair)?: A Little Help needed to walk in hospital room?: A Little Help needed climbing 3-5 steps with a railing? : A Lot 6 Click Score: 17    End of Session Equipment Utilized During Treatment: Gait belt Activity Tolerance: Patient tolerated treatment well Patient left: with family/visitor present (sitting EOB) Nurse Communication: Mobility status PT Visit Diagnosis: Muscle weakness (generalized) (M62.81);Difficulty in walking, not elsewhere classified (R26.2);Pain Pain - Right/Left: Right Pain - part of body: Hip     Time: [id number] PT Time Calculation (min) (ACUTE ONLY): 41 min  Charges:  $Gait Training: 8-22 mins $Therapeutic Activity: 23-37 mins            Mikel Cella, PTA    Josie Dixon 12/22/2020, 11:09 AM

## 2020-12-22 NOTE — Plan of Care (Signed)
Patient alert and oriented x 4, complains of pain 5-7/10, relieved with scheduled pain medications. Vitals stable, no respiratory distress on room air. Wound vac remains clean, dry and intact.  Problem: Education: Goal: Knowledge of the prescribed therapeutic regimen will improve Outcome: Progressing Goal: Understanding of discharge needs will improve Outcome: Progressing   Problem: Activity: Goal: Ability to avoid complications of mobility impairment will improve Outcome: Progressing Goal: Ability to tolerate increased activity will improve Outcome: Progressing   Problem: Clinical Measurements: Goal: Postoperative complications will be avoided or minimized Outcome: Progressing   Problem: Pain Management: Goal: Pain level will decrease with appropriate interventions Outcome: Progressing   Problem: Health Behavior/Discharge Planning: Goal: Ability to manage health-related needs will improve Outcome: Progressing   Problem: Clinical Measurements: Goal: Ability to maintain clinical measurements within normal limits will improve Outcome: Progressing Goal: Will remain free from infection Outcome: Progressing   Problem: Activity: Goal: Risk for activity intolerance will decrease Outcome: Progressing   Problem: Nutrition: Goal: Adequate nutrition will be maintained Outcome: Progressing

## 2020-12-22 NOTE — Progress Notes (Signed)
Met with the patient and his wife at the bedside, he was independent at home at baseline He has a cane at home but needs a RW and a 3 in 1 I notified Rhonda at [facility] and it will be brought to the room prior to DC He is set up with [facility] for [place] He has transportation and can afford his medications

## 2020-12-22 NOTE — Evaluation (Signed)
Occupational Therapy Evaluation Patient Details Name: Alan Trevino MRN: [id number] DOB: [birth year] Today's Date: 12/22/2020    History of Present Illness Pt is admitted for R THR. HIstory includes anemia, depression, DDD, HLD, and HTN.   Clinical Impression   Alan Trevino was seen for OT evaluation this date. 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 Pt lives with spouse in home c ramped entrance, family available 24/7. Pt presents to acute OT demonstrating near baseline mobility. Pt requires SUPERVISION + RW for toilet t/f and grooming standing sinkside - no LOBs w/o UE support. MOD I don/doff L sock, MOD A don/doff R sock - wife at bedside endorses plan to assist at home as needed. Pt and spouse instructed in self care skills, falls prevention strategies, home/routines modifications, DME/AE for LB bathing and dressing tasks, and compression stocking mgt strategies. All education complete and pt demonstrates good carryover of strategies. Upon hospital discharge, anticipate no OT follow up needs. Will sign off.      Follow Up Recommendations  No OT follow up    Equipment Recommendations  3 in 1 bedside commode    Recommendations for Other Services       Precautions / Restrictions Precautions Precautions: Anterior Hip Restrictions Weight Bearing Restrictions: Yes RLE Weight Bearing: Weight bearing as tolerated      Mobility Bed Mobility               General bed mobility comments: seated EOB on arrival    Transfers Overall transfer level: Needs assistance Equipment used: Rolling walker (2 wheeled) Transfers: Sit to/from Stand Sit to Stand: Supervision         General transfer comment: cues for hand placement    Balance Overall balance assessment: Needs assistance Sitting-balance support: Feet supported Sitting balance-Leahy Scale: Good     Standing balance support: No upper  extremity supported;During functional activity Standing balance-Leahy Scale: Good                             ADL either performed or assessed with clinical judgement   ADL Overall ADL's : Needs assistance/impaired                                       General ADL Comments: SUPERVISION + RW for toilet t/f and grooming standing sinkside - no LOBs w/o UE support. MOD I don/doff L sock, MOD A don/doff R sock - wife at bedside endorses plan to assist at home as needed.      Pertinent Vitals/Pain Pain Assessment: 0-10 Pain Score: 9  Pain Location: R ant hip Pain Descriptors / Indicators: Operative site guarding Pain Intervention(s): Limited activity within patient's tolerance;Repositioned;Patient requesting pain meds-RN notified     Hand Dominance Right   Extremity/Trunk Assessment Upper Extremity Assessment Upper Extremity Assessment: Overall WFL for tasks assessed   Lower Extremity Assessment Lower Extremity Assessment: Generalized weakness       Communication Communication Communication: No difficulties   Cognition Arousal/Alertness: Awake/alert Behavior During Therapy: WFL for tasks assessed/performed Overall Cognitive Status: Within Functional Limits for tasks assessed  General Comments       Exercises Exercises: Other exercises Other Exercises Other Exercises: Pt educated re: OT role, DME recs, d/c recs, falls prevention, ECS, home/routines modifications, adapted dressing/bathing strategies Other Exercises: LBD, hand washing, tooth brushing, hair brushing, sit<>stand, sitting/standing balance/tolerance, ~40 ft mobility   Shoulder Instructions      Home Living Family/patient expects to be discharged to:: Private residence Living Arrangements: Spouse/significant other Available Help at Discharge: Family Type of Home: House Home Access: Ramped entrance     Home Layout: One level      Bathroom Shower/Tub: Walk-in shower         Home Equipment: Kasandra Knudsen - single point          Prior Functioning/Environment Level of Independence: Independent with assistive device(s)        Comments: occasionally uses SPC. Reports no falls        OT Problem List: Decreased range of motion;Decreased activity tolerance         OT Goals(Current goals can be found in the care plan section) Acute Rehab OT Goals Patient Stated Goal: to go home OT Goal Formulation: With patient/family Time For Goal Achievement: 01/05/21 Potential to Achieve Goals: Good   AM-PAC OT "6 Clicks" Daily Activity     Outcome Measure Help from another person eating meals?: None Help from another person taking care of personal grooming?: A Little Help from another person toileting, which includes using toliet, bedpan, or urinal?: None Help from another person bathing (including washing, rinsing, drying)?: A Little Help from another person to put on and taking off regular upper body clothing?: None Help from another person to put on and taking off regular lower body clothing?: A Little 6 Click Score: 21   End of Session Equipment Utilized During Treatment: Rolling walker Nurse Communication: Patient requests pain meds  Activity Tolerance: Patient tolerated treatment well Patient left: in chair;with call bell/phone within reach;with family/visitor present  OT Visit Diagnosis: Other abnormalities of gait and mobility (R26.89)                [id number] OT Time Calculation (min): 33 min Charges:  OT General Charges $OT Visit: 1 Visit OT Evaluation $OT Eval Low Complexity: 1 Low OT Treatments $Self Care/Home Management : 23-37 mins  Dessie Coma, M.S. OTR/L  12/22/20, 9:37 AM  ascom [phone]

## 2021-05-05 ENCOUNTER — Other Ambulatory Visit: Payer: Self-pay | Admitting: Family Medicine

## 2021-05-05 DIAGNOSIS — E782 Mixed hyperlipidemia: Secondary | ICD-10-CM

## 2021-05-05 NOTE — Telephone Encounter (Signed)
Requested medication (s) are due for refill today: yes  Requested medication (s) are on the active medication list: yes  Last refill:  11/10/20 #90 1 RF  Future visit scheduled: yes  Notes to clinic:  overdue lab work   Requested Prescriptions  Pending Prescriptions Disp Refills   atorvastatin (LIPITOR) 10 MG tablet [Pharmacy Med Name: ATORVASTATIN 10 MG TABLET] 30 tablet 5    Sig: TAKE 1 TABLET BY MOUTH EVERY DAY     Cardiovascular:  Antilipid - Statins Failed - 05/05/2021  9:52 AM      Failed - Total Cholesterol in normal range and within 360 days    Cholesterol, Total  Date Value Ref Range Status  03/09/2020 201 (H) 100 - 199 mg/dL Final          Failed - LDL in normal range and within 360 days    LDL Chol Calc (NIH)  Date Value Ref Range Status  03/09/2020 126 (H) 0 - 99 mg/dL Final          Failed - HDL in normal range and within 360 days    HDL  Date Value Ref Range Status  03/09/2020 48 >39 mg/dL Final          Failed - Triglycerides in normal range and within 360 days    Triglycerides  Date Value Ref Range Status  03/09/2020 151 (H) 0 - 149 mg/dL Final          Passed - Patient is not pregnant      Passed - Valid encounter within last 12 months    Recent Outpatient Visits           5 months ago Essential hypertension   [place], Deanna C, MD   1 year ago Essential hypertension   [place], Deanna C, MD   1 year ago Annual physical exam   [place], Deanna C, MD   1 year ago Essential hypertension   [place], Deanna C, MD   2 years ago Essential hypertension   [place] [place], Deanna C, MD       Future Appointments             In 1 week Juline Patch, MD [facility], [facility]

## 2021-05-14 ENCOUNTER — Ambulatory Visit: Payer: Self-pay | Admitting: Family Medicine

## 2021-05-18 ENCOUNTER — Ambulatory Visit (INDEPENDENT_AMBULATORY_CARE_PROVIDER_SITE_OTHER): Payer: BC Managed Care – PPO | Admitting: Family Medicine

## 2021-05-18 ENCOUNTER — Encounter: Payer: Self-pay | Admitting: Family Medicine

## 2021-05-18 ENCOUNTER — Other Ambulatory Visit: Payer: Self-pay

## 2021-05-18 VITALS — BP 130/80 | HR 80 | Ht 62.0 in | Wt 183.0 lb

## 2021-05-18 DIAGNOSIS — M519 Unspecified thoracic, thoracolumbar and lumbosacral intervertebral disc disorder: Secondary | ICD-10-CM

## 2021-05-18 DIAGNOSIS — E782 Mixed hyperlipidemia: Secondary | ICD-10-CM

## 2021-05-18 DIAGNOSIS — I10 Essential (primary) hypertension: Secondary | ICD-10-CM | POA: Diagnosis not present

## 2021-05-18 MED ORDER — ATORVASTATIN CALCIUM 10 MG PO TABS: 10.0000 mg | ORAL_TABLET | Freq: Every day | ORAL | 1 refills | Status: DC

## 2021-05-18 MED ORDER — AMLODIPINE BESY-BENAZEPRIL HCL 10-20 MG PO CAPS: 1.0000 | ORAL_CAPSULE | Freq: Every day | ORAL | 1 refills | Status: DC

## 2021-05-18 MED ORDER — CYCLOBENZAPRINE HCL 5 MG PO TABS: 5.0000 mg | ORAL_TABLET | Freq: Every day | ORAL | 1 refills | Status: DC

## 2021-05-18 NOTE — Progress Notes (Signed)
Date:  05/18/2021   Name:  Alan Trevino   DOB:  1960-06-29   MRN:  [id number]   Chief Complaint: Hypertension, Hyperlipidemia, and back spasm  Hypertension This is a chronic problem. The current episode started more than 1 year ago. The problem has been gradually improving since onset. The problem is controlled. Pertinent negatives include no anxiety, blurred vision, chest pain, headaches, malaise/fatigue, neck pain, orthopnea, palpitations, peripheral edema, PND, shortness of breath or sweats. There are no associated agents to hypertension. Risk factors for coronary artery disease include dyslipidemia. Past treatments include ACE inhibitors and calcium channel blockers. The current treatment provides moderate improvement. There are no compliance problems.  There is no history of angina, kidney disease, CAD/MI, CVA, heart failure, left ventricular hypertrophy, PVD or retinopathy. There is no history of chronic renal disease, a hypertension causing med or renovascular disease.  Hyperlipidemia This is a chronic problem. The current episode started more than 1 year ago. The problem is controlled. Recent lipid tests were reviewed and are normal. He has no history of chronic renal disease. Pertinent negatives include no chest pain or shortness of breath. Current antihyperlipidemic treatment includes statins. The current treatment provides moderate improvement of lipids. Risk factors for coronary artery disease include dyslipidemia and hypertension.  Back Pain This is a chronic problem. The current episode started more than 1 year ago. The problem occurs intermittently. The pain is present in the lumbar spine. The pain is mild. Pertinent negatives include no chest pain or headaches.   Lab Results  Component Value Date   NA 135 12/22/2020   K 4.4 12/22/2020   CO2 27 12/22/2020   GLUCOSE 117 (H) 12/22/2020   BUN 16 12/22/2020   CREATININE 1.20 12/22/2020   CALCIUM 8.7 (L) 12/22/2020   GFRNONAA  >60 12/22/2020   Lab Results  Component Value Date   CHOL 201 (H) 03/09/2020   HDL 48 03/09/2020   LDLCALC 126 (H) 03/09/2020   TRIG 151 (H) 03/09/2020   CHOLHDL 5.6 (H) 02/01/2019   No results found for: TSH No results found for: HGBA1C Lab Results  Component Value Date   WBC 12.7 (H) 12/22/2020   HGB 11.8 (L) 12/22/2020   HCT 35.2 (L) 12/22/2020   MCV 93.1 12/22/2020   PLT 186 12/22/2020   Lab Results  Component Value Date   ALT 26 12/12/2020   AST 18 12/12/2020   ALKPHOS 69 12/12/2020   BILITOT 1.1 12/12/2020   No results found for: 25OHVITD2, 25OHVITD3, VD25OH   Review of Systems  Constitutional:  Negative for malaise/fatigue.  Eyes:  Negative for blurred vision.  Respiratory:  Negative for shortness of breath.   Cardiovascular:  Negative for chest pain, palpitations, orthopnea and PND.  Musculoskeletal:  Positive for back pain. Negative for neck pain.  Neurological:  Negative for headaches.   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   Personal history of colonic polyps    Acute peptic ulcer without hemorrhage and perforation    Hiatal hernia    Esophagogastric ulcer    Stricture and stenosis of esophagus    GIB (gastrointestinal bleeding) 11/30/2016   Hematemesis without nausea    Acute esophagogastric ulcer    Upper GI bleed 11/28/2016   Acute recurrent maxillary sinusitis 09/07/2016   Special screening for malignant neoplasms, colon    Benign neoplasm of ascending colon    Benign neoplasm of transverse colon  Benign neoplasm of descending colon    Rectal polyp    Lumbar disc disease 11/03/2015   Iron deficiency anemia 08/25/2014   Acute gastrointestinal bleeding 08/25/2014   Acute back pain with sciatica 08/25/2014   Routine general medical examination at a health care facility 08/25/2014   DDD (degenerative disc disease), lumbosacral 08/25/2014   Recurrent major depressive episodes  ([place]) 08/25/2014   Essential (primary) hypertension 08/25/2014   HLD (hyperlipidemia) 08/25/2014    No Known Allergies  Past Surgical History:  Procedure Laterality Date   COLONOSCOPY WITH PROPOFOL N/A 12/29/2015   Procedure: COLONOSCOPY WITH PROPOFOL;  Surgeon: [name], MD;  Location: [place];  Service: Endoscopy;  Laterality: N/A;   COLONOSCOPY WITH PROPOFOL N/A 11/29/2019   Procedure: COLONOSCOPY WITH PROPOFOL;  Surgeon: [name], MD;  Location: [place];  Service: Endoscopy;  Laterality: N/A;  priority 4   ESOPHAGEAL DILATION     stretching   ESOPHAGEAL DILATION  01/17/2017   Procedure: ESOPHAGEAL DILATION;  Surgeon: [name], MD;  Location: [place];  Service: Gastroenterology;;   ESOPHAGOGASTRODUODENOSCOPY (EGD) WITH PROPOFOL N/A 11/29/2016   Procedure: ESOPHAGOGASTRODUODENOSCOPY (EGD) WITH PROPOFOL;  Surgeon: [name], MD;  Location[facility] ENDOSCOPY;  Service: Endoscopy;  Laterality: N/A;   ESOPHAGOGASTRODUODENOSCOPY (EGD) WITH PROPOFOL N/A 01/17/2017   Procedure: ESOPHAGOGASTRODUODENOSCOPY (EGD) WITH PROPOFOL;  Surgeon: [name], MD;  Location: [place];  Service: Gastroenterology;  Laterality: N/A;   HERNIA REPAIR Right    INGUINAL HERNIA REPAIR Left 01/14/2020   Procedure: HERNIA REPAIR INGUINAL ADULT;  Surgeon: Robert Bellow, MD;  Location: [facility];  Service: General;  Laterality: Left;   POLYPECTOMY N/A 12/29/2015   Procedure: POLYPECTOMY;  Surgeon: [name], MD;  Location: [place];  Service: Endoscopy;  Laterality: N/A;   POLYPECTOMY  01/17/2017   Procedure: POLYPECTOMY;  Surgeon: [name], MD;  Location: [place];  Service: Gastroenterology;;   POLYPECTOMY N/A 11/29/2019   Procedure: POLYPECTOMY;  Surgeon: [name], MD;  Location: [place];  Service: Endoscopy;  Laterality: N/A;   SHOULDER SURGERY Right 09/20/2009   TOTAL HIP ARTHROPLASTY Right 12/21/2020    Procedure: TOTAL HIP ARTHROPLASTY ANTERIOR APPROACH;  Surgeon: Hessie Knows, MD;  Location: [facility];  Service: Orthopedics;  Laterality: Right;    Social History   Tobacco Use   Smoking status: Former    Packs/day: 2.00    Years: 30.00    Pack years: 60.00    Types: Cigarettes    Quit date: 2000    Years since quitting: 23.0   Smokeless tobacco: Current    Types: Chew  Vaping Use   Vaping Use: Never used  Substance Use Topics   Alcohol use: Yes    Alcohol/week: 12.0 standard drinks    Types: 12 Cans of beer per week    Comment: OCC BEER   Drug use: No     Medication list has been reviewed and updated.  Current Meds  Medication Sig   acetaminophen (TYLENOL) 500 MG tablet Take 1,000 mg by mouth every 6 (six) hours as needed for moderate pain.    amLODipine-benazepril (LOTREL) 10-20 MG capsule Take 1 capsule by mouth daily.   atorvastatin (LIPITOR) 10 MG tablet Take 1 tablet (10 mg total) by mouth daily. TAKE 1 TABLET BY MOUTH EVERY DAY   buPROPion (WELLBUTRIN XL) 300 MG 24 hr tablet Take 300 mg by mouth daily.    busPIRone (BUSPAR) 30 MG tablet Take 30 mg by mouth 2 (  two) times daily.    clopidogrel (PLAVIX) 75 MG tablet Take 1 tablet (75 mg total) by mouth every Monday, Wednesday, and Friday.   cyclobenzaprine (FLEXERIL) 5 MG tablet Take 2 tablets (10 mg total) by mouth at bedtime. (Patient taking differently: Take 10 mg by mouth at bedtime as needed for muscle spasms.)   methylphenidate (RITALIN) 20 MG tablet Take 20 mg by mouth 2 (two) times daily with breakfast and lunch.    propranolol (INDERAL) 20 MG tablet Take 20-40 mg by mouth See admin instructions. 40 mg in the morning, 20 mg in the evening   [DISCONTINUED] traMADol (ULTRAM) 50 MG tablet Take 1 tablet (50 mg total) by mouth every 6 (six) hours as needed.    PHQ 2/9 Scores 05/18/2021 11/10/2020 03/09/2020 09/10/2019  PHQ - 2 Score 0 0 0 0  PHQ- 9 Score 0 0 0 0    GAD 7 : Generalized Anxiety Score 05/18/2021  11/10/2020 03/09/2020 09/10/2019  Nervous, Anxious, on Edge 0 0 0 0  Control/stop worrying 0 0 0 0  Worry too much - different things 0 0 0 0  Trouble relaxing 0 0 0 0  Restless 0 0 0 0  Easily annoyed or irritable 0 0 0 1  Afraid - awful might happen 0 0 0 0  Total GAD 7 Score 0 0 0 1  Anxiety Difficulty Not difficult at all - - Not difficult at all    BP Readings from Last 3 Encounters:  05/18/21 130/80  12/22/20 115/67  12/12/20 134/84    Physical Exam  Wt Readings from Last 3 Encounters:  05/18/21 183 lb (83 kg)  12/21/20 173 lb 15.1 oz (78.9 kg)  12/12/20 174 lb (78.9 kg)    BP 130/80    Pulse 80    Ht 5\' 2"  (1.575 m)    Wt 183 lb (83 kg)    BMI 33.47 kg/m   Assessment and Plan:  1. Essential hypertension Chronic.  Controlled.  Stable.  Blood pressure 130/80.  Continue amlodipine benazepril 10-20 mg once a day.  Will check CMP for electrolytes and GFR.  We will refill for 6 months and will recheck in 6 months - amLODipine-benazepril (LOTREL) 10-20 MG capsule; Take 1 capsule by mouth daily.  Dispense: 90 capsule; Refill: 1 - Comprehensive Metabolic Panel (CMET)  2. Mixed hyperlipidemia Chronic.  Controlled.  Stable.  Continue atorvastatin 10 mg once a day.  Will check lipid panel for current level of LDL control. - atorvastatin (LIPITOR) 10 MG tablet; Take 1 tablet (10 mg total) by mouth daily. TAKE 1 TABLET BY MOUTH EVERY DAY  Dispense: 90 tablet; Refill: 1 - Lipid Panel With LDL/HDL Ratio  3. Lumbar disc disease Patient with history of lumbar disc disease which occasionally is bothersome at night.  Patient may continue cyclobenzaprine 5 mg nightly. - cyclobenzaprine (FLEXERIL) 5 MG tablet; Take 1 tablet (5 mg total) by mouth at bedtime.  Dispense: 90 tablet; Refill: 1

## 2021-05-19 LAB — LIPID PANEL WITH LDL/HDL RATIO
Cholesterol, Total: 157 mg/dL (ref 100–199)
HDL: 43 mg/dL (ref >39)
LDL Chol Calc (NIH): 90 mg/dL (ref 0–99)
LDL/HDL Ratio: 2.1 ratio (ref 0.0–3.6)
Triglycerides: 136 mg/dL (ref 0–149)
VLDL Cholesterol Cal: 24 mg/dL (ref 5–40)

## 2021-05-19 LAB — COMPREHENSIVE METABOLIC PANEL
ALT: 23 IU/L (ref 0–44)
AST: 28 IU/L (ref 0–40)
Albumin/Globulin Ratio: 2 (ref 1.2–2.2)
Albumin: 4.5 g/dL (ref 3.8–4.8)
Alkaline Phosphatase: 72 IU/L (ref 44–121)
BUN/Creatinine Ratio: 14 (ref 10–24)
BUN: 14 mg/dL (ref 8–27)
Bilirubin Total: 0.3 mg/dL (ref 0.0–1.2)
CO2: 24 mmol/L (ref 20–29)
Calcium: 9.3 mg/dL (ref 8.6–10.2)
Chloride: 104 mmol/L (ref 96–106)
Creatinine, Ser: 1.03 mg/dL (ref 0.76–1.27)
Globulin, Total: 2.2 g/dL (ref 1.5–4.5)
Glucose: 98 mg/dL (ref 70–99)
Potassium: 4.2 mmol/L (ref 3.5–5.2)
Sodium: 142 mmol/L (ref 134–144)
Total Protein: 6.7 g/dL (ref 6.0–8.5)
eGFR: 83 mL/min/{1.73_m2} (ref >59)

## 2021-09-29 IMAGING — XA DG HIP (WITH PELVIS) OPERATIVE*R*
4 series · 15 of 15 positions shown · non-contrast
Comparison: None.

CLINICAL DATA: Surgery, elective NNC.X ([id number])

EXAM:
OPERATIVE RIGHT HIP (WITH PELVIS IF PERFORMED)  VIEWS
TECHNIQUE: Fluoroscopic spot image(s) were submitted for interpretation
post-operatively.

[Series 1: ortho standard · 2 acquisitions, 4 frames shown (1 of 4)]
[im 1/2]
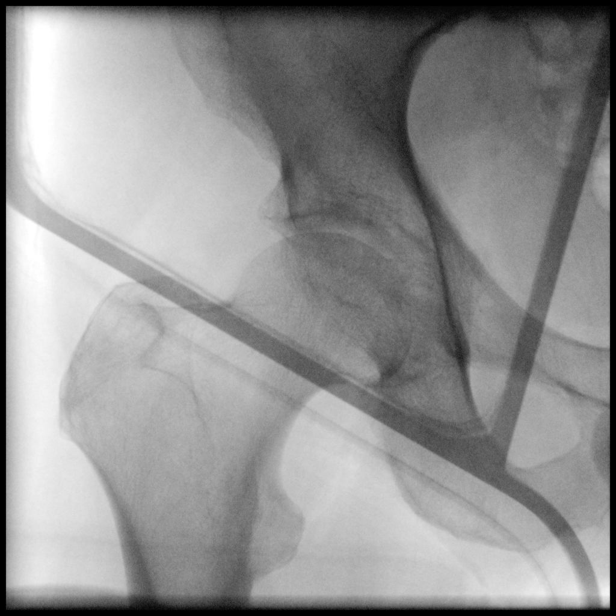
[im 2/2]
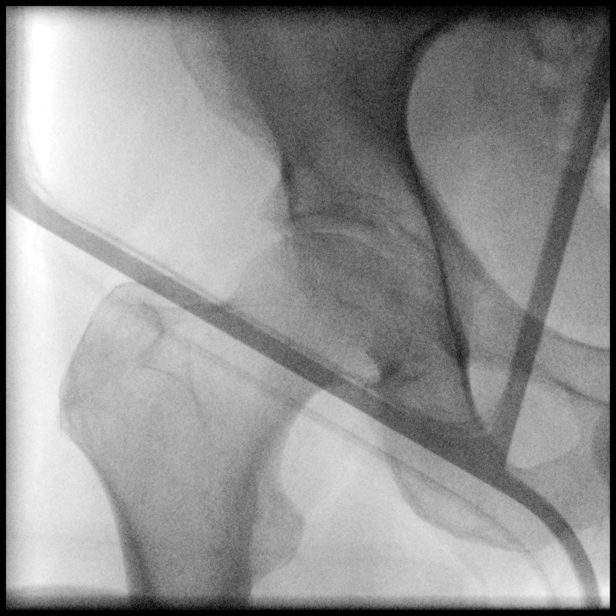
[im 2/2]
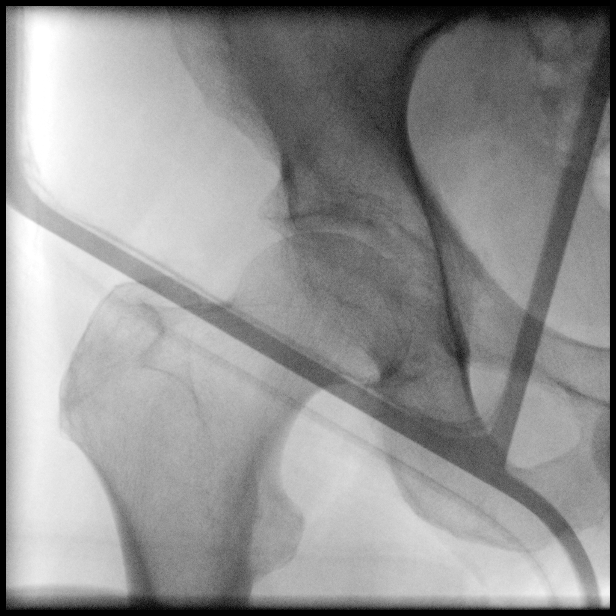
[im 2/2]
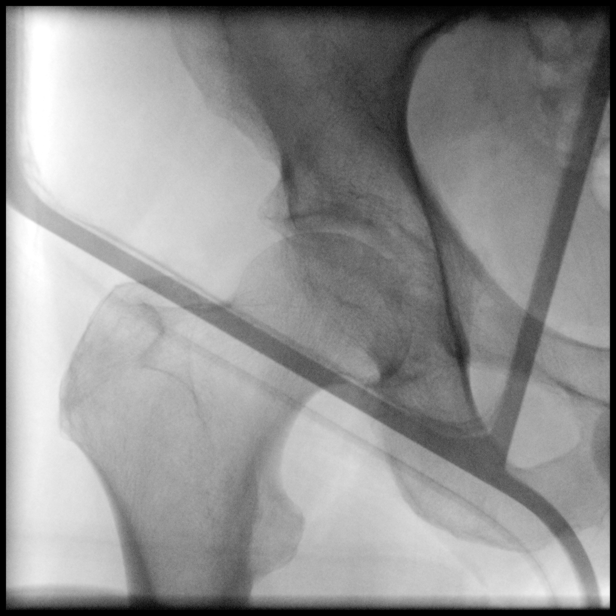

[Series 4: ortho standard · 2 acquisitions, 5 frames shown (2 of 4)]
[im 1/2]
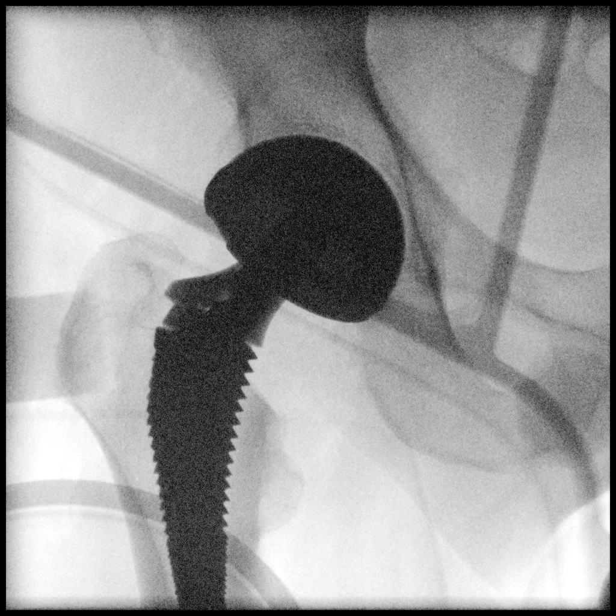
[im 1/2]
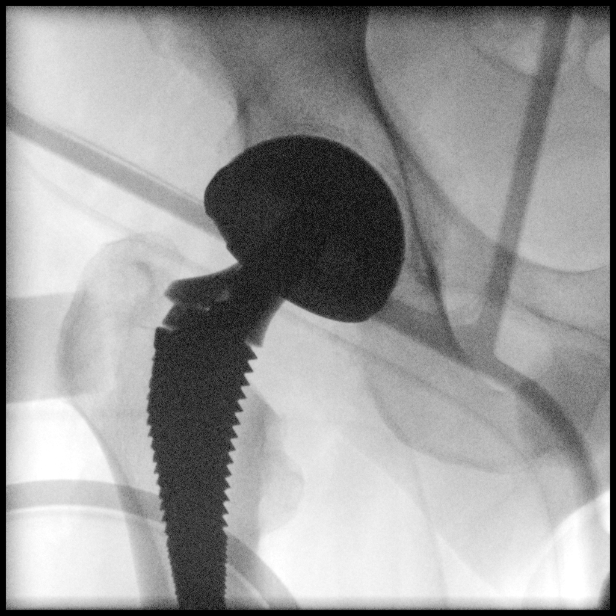
[im 1/2]
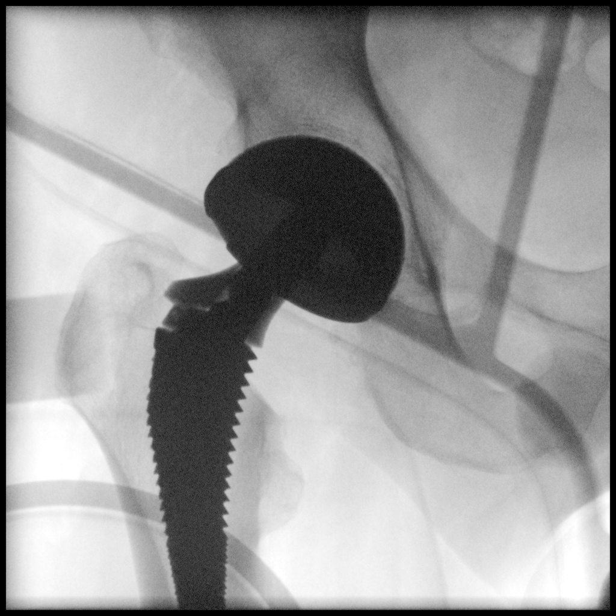
[im 1/2]
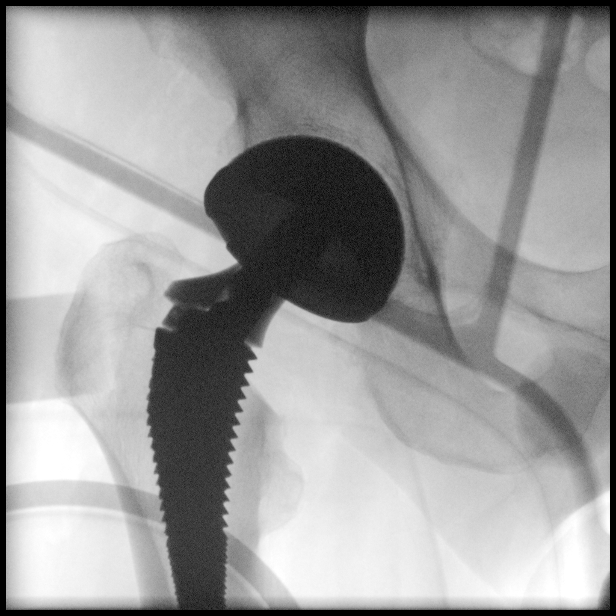
[im 2/2]
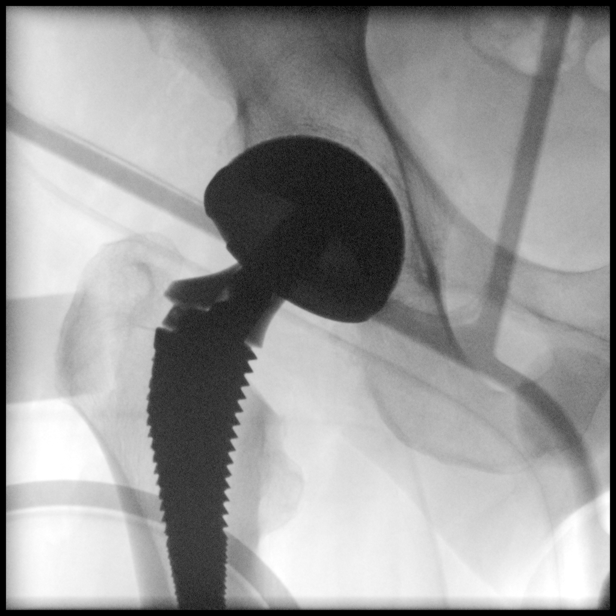

[Series 5: ortho standard · 1 of 1 slices shown (3 of 4)]
[im 1/1]
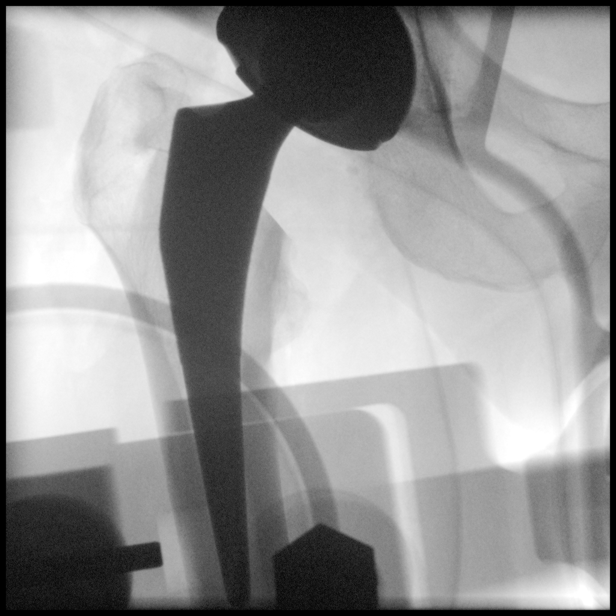

[Series 7: ortho standard · 2 acquisitions, 5 frames shown (4 of 4)]
[im 1/2]
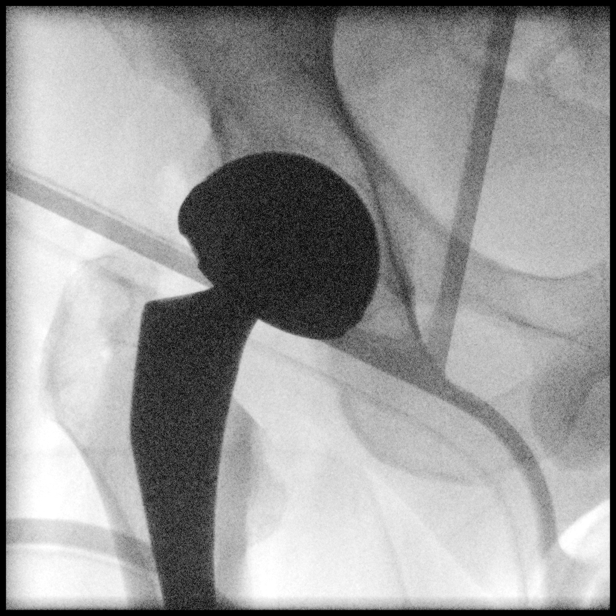
[im 1/2]
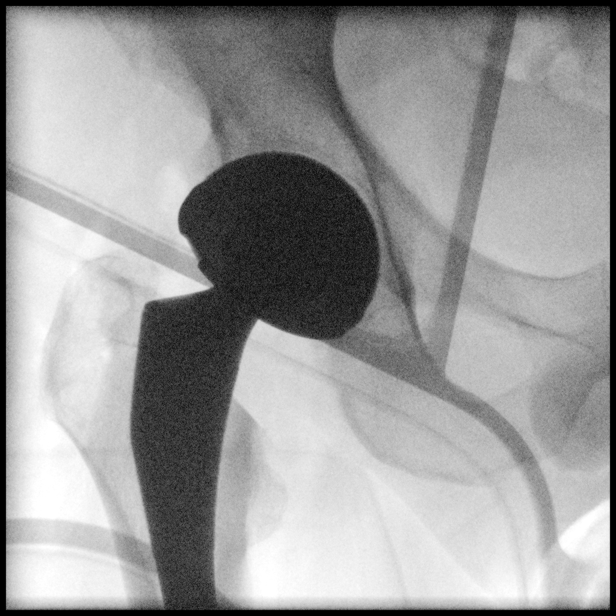
[im 1/2]
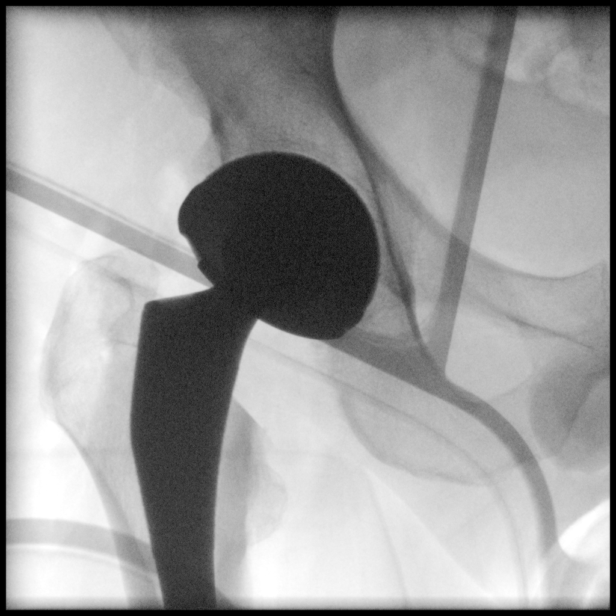
[im 1/2]
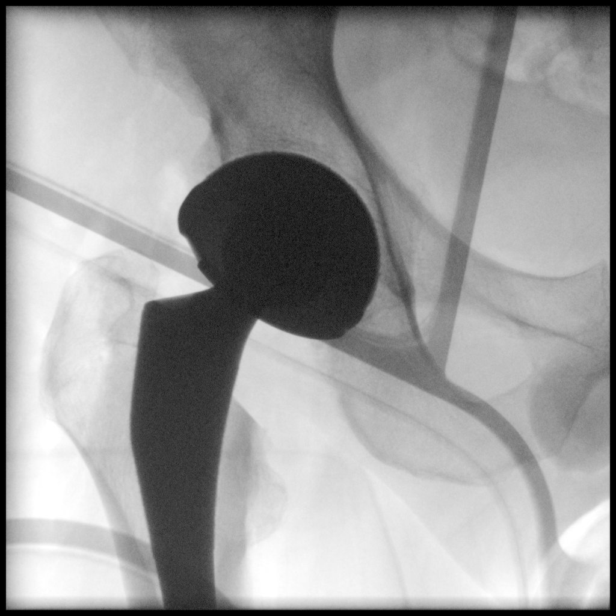
[im 2/2]
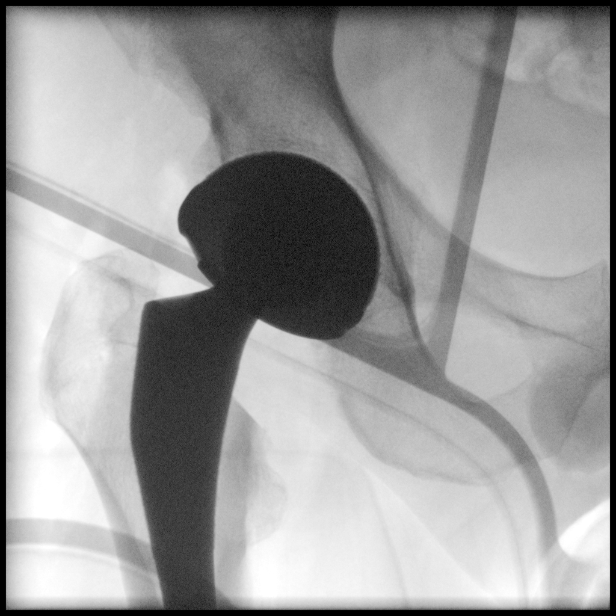

[15 of 15 positions shown; findings below may reference images not displayed]

FINDINGS: Fluoro time: 18 seconds.

Multiple images and cine clips demonstrate changes associated with
right total hip arthroplasty. No unexpected findings.
IMPRESSION: Intraoperative fluoroscopy during right total hip arthroplasty.

## 2021-11-15 ENCOUNTER — Ambulatory Visit: Payer: BC Managed Care – PPO | Admitting: Family Medicine

## 2021-11-16 ENCOUNTER — Encounter: Payer: Self-pay | Admitting: Family Medicine

## 2021-11-16 ENCOUNTER — Ambulatory Visit (INDEPENDENT_AMBULATORY_CARE_PROVIDER_SITE_OTHER): Payer: BC Managed Care – PPO | Admitting: Family Medicine

## 2021-11-16 VITALS — BP 128/60 | HR 64 | Ht 62.0 in | Wt 182.0 lb

## 2021-11-16 DIAGNOSIS — E782 Mixed hyperlipidemia: Secondary | ICD-10-CM

## 2021-11-16 DIAGNOSIS — D509 Iron deficiency anemia, unspecified: Secondary | ICD-10-CM | POA: Diagnosis not present

## 2021-11-16 DIAGNOSIS — I10 Essential (primary) hypertension: Secondary | ICD-10-CM

## 2021-11-16 DIAGNOSIS — M519 Unspecified thoracic, thoracolumbar and lumbosacral intervertebral disc disorder: Secondary | ICD-10-CM | POA: Diagnosis not present

## 2021-11-16 MED ORDER — ATORVASTATIN CALCIUM 10 MG PO TABS: 10.0000 mg | ORAL_TABLET | Freq: Every day | ORAL | 1 refills | Status: DC

## 2021-11-16 MED ORDER — CYCLOBENZAPRINE HCL 5 MG PO TABS: 5.0000 mg | ORAL_TABLET | Freq: Every day | ORAL | 1 refills | Status: DC

## 2021-11-16 MED ORDER — AMLODIPINE BESY-BENAZEPRIL HCL 10-20 MG PO CAPS: 1.0000 | ORAL_CAPSULE | Freq: Every day | ORAL | 1 refills | Status: DC

## 2021-11-16 NOTE — Progress Notes (Signed)
Date:  11/16/2021   Name:  Alan Trevino   DOB:  21-Jan-1961   MRN:  [id number]   Chief Complaint: Hyperlipidemia, Hypertension, and back spasms  Hyperlipidemia This is a new problem. The current episode started more than 1 year ago. The problem is controlled. Recent lipid tests were reviewed and are normal. He has no history of chronic renal disease, diabetes, hypothyroidism, liver disease, obesity or nephrotic syndrome. There are no known factors aggravating his hyperlipidemia. Pertinent negatives include no chest pain, focal sensory loss, focal weakness, leg pain, myalgias or shortness of breath. Current antihyperlipidemic treatment includes statins. The current treatment provides moderate improvement of lipids. There are no compliance problems.  There are no known risk factors for coronary artery disease.  Hypertension This is a chronic problem. The current episode started more than 1 year ago. The problem has been gradually improving since onset. The problem is controlled. Pertinent negatives include no anxiety, blurred vision, chest pain, headaches, malaise/fatigue, neck pain, orthopnea, palpitations, peripheral edema, PND, shortness of breath or sweats. There are no associated agents to hypertension. Past treatments include calcium channel blockers. The current treatment provides moderate improvement. There are no compliance problems.  There is no history of chronic renal disease, a hypertension causing med or renovascular disease.  Back Pain This is a chronic problem. The current episode started more than 1 year ago. The problem occurs intermittently. The problem has been waxing and waning since onset. The pain is present in the lumbar spine. The quality of the pain is described as aching. The pain radiates to the right thigh. The pain is moderate. Pertinent negatives include no abdominal pain, chest pain, fever, headaches, leg pain or numbness.  Anemia Presents for follow-up visit. There  has been no abdominal pain, fever, malaise/fatigue or palpitations. Signs of blood loss that are not present include hematemesis, hematochezia and melena. There is no history of chronic renal disease or hypothyroidism. There are no compliance problems.     Lab Results  Component Value Date   NA 142 05/18/2021   K 4.2 05/18/2021   CO2 24 05/18/2021   GLUCOSE 98 05/18/2021   BUN 14 05/18/2021   CREATININE 1.03 05/18/2021   CALCIUM 9.3 05/18/2021   EGFR 83 05/18/2021   GFRNONAA >60 12/22/2020   Lab Results  Component Value Date   CHOL 157 05/18/2021   HDL 43 05/18/2021   LDLCALC 90 05/18/2021   TRIG 136 05/18/2021   CHOLHDL 5.6 (H) 02/01/2019   No results found for: "TSH" No results found for: "HGBA1C" Lab Results  Component Value Date   WBC 12.7 (H) 12/22/2020   HGB 11.8 (L) 12/22/2020   HCT 35.2 (L) 12/22/2020   MCV 93.1 12/22/2020   PLT 186 12/22/2020   Lab Results  Component Value Date   ALT 23 05/18/2021   AST 28 05/18/2021   ALKPHOS 72 05/18/2021   BILITOT 0.3 05/18/2021   No results found for: "25OHVITD2", "25OHVITD3", "VD25OH"   Review of Systems  Constitutional:  Negative for diaphoresis, fatigue, fever and malaise/fatigue.  Eyes:  Negative for blurred vision and visual disturbance.  Respiratory:  Negative for chest tightness, shortness of breath and wheezing.   Cardiovascular:  Negative for chest pain, palpitations, orthopnea, leg swelling and PND.  Gastrointestinal:  Negative for abdominal distention, abdominal pain, anal bleeding, hematemesis, hematochezia and melena.  Endocrine: Negative for polydipsia and polyuria.  Genitourinary:  Negative for difficulty urinating.  Musculoskeletal:  Positive for back pain. Negative for myalgias  and neck pain.  Neurological:  Negative for dizziness, focal weakness, speech difficulty, numbness and headaches.    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    Personal history of colonic polyps    Acute peptic ulcer without hemorrhage and perforation    Hiatal hernia    Esophagogastric ulcer    Stricture and stenosis of esophagus    GIB (gastrointestinal bleeding) 11/30/2016   Hematemesis without nausea    Acute esophagogastric ulcer    Upper GI bleed 11/28/2016   Acute recurrent maxillary sinusitis 09/07/2016   Special screening for malignant neoplasms, colon    Benign neoplasm of ascending colon    Benign neoplasm of transverse colon    Benign neoplasm of descending colon    Rectal polyp    Lumbar disc disease 11/03/2015   Iron deficiency anemia 08/25/2014   Acute gastrointestinal bleeding 08/25/2014   Acute back pain with sciatica 08/25/2014   Routine general medical examination at a health care facility 08/25/2014   DDD (degenerative disc disease), lumbosacral 08/25/2014   Recurrent major depressive episodes ([place]) 08/25/2014   Essential (primary) hypertension 08/25/2014   HLD (hyperlipidemia) 08/25/2014    No Known Allergies  Past Surgical History:  Procedure Laterality Date   COLONOSCOPY WITH PROPOFOL N/A 12/29/2015   Procedure: COLONOSCOPY WITH PROPOFOL;  Surgeon: [name], MD;  Location: [place];  Service: Endoscopy;  Laterality: N/A;   COLONOSCOPY WITH PROPOFOL N/A 11/29/2019   Procedure: COLONOSCOPY WITH PROPOFOL;  Surgeon: [name], MD;  Location: [place];  Service: Endoscopy;  Laterality: N/A;  priority 4   ESOPHAGEAL DILATION     stretching   ESOPHAGEAL DILATION  01/17/2017   Procedure: ESOPHAGEAL DILATION;  Surgeon: [name], MD;  Location: [place];  Service: Gastroenterology;;   ESOPHAGOGASTRODUODENOSCOPY (EGD) WITH PROPOFOL N/A 11/29/2016   Procedure: ESOPHAGOGASTRODUODENOSCOPY (EGD) WITH PROPOFOL;  Surgeon: [name], MD;  Location: [facility] ENDOSCOPY;  Service: Endoscopy;  Laterality: N/A;   ESOPHAGOGASTRODUODENOSCOPY (EGD) WITH PROPOFOL N/A 01/17/2017   Procedure:  ESOPHAGOGASTRODUODENOSCOPY (EGD) WITH PROPOFOL;  Surgeon: [name], MD;  Location: [place];  Service: Gastroenterology;  Laterality: N/A;   HERNIA REPAIR Right    INGUINAL HERNIA REPAIR Left 01/14/2020   Procedure: HERNIA REPAIR INGUINAL ADULT;  Surgeon: Robert Bellow, MD;  Location: [facility];  Service: General;  Laterality: Left;   POLYPECTOMY N/A 12/29/2015   Procedure: POLYPECTOMY;  Surgeon: [name], MD;  Location: [place];  Service: Endoscopy;  Laterality: N/A;   POLYPECTOMY  01/17/2017   Procedure: POLYPECTOMY;  Surgeon: [name], MD;  Location: [place];  Service: Gastroenterology;;   POLYPECTOMY N/A 11/29/2019   Procedure: POLYPECTOMY;  Surgeon: [name], MD;  Location: [place];  Service: Endoscopy;  Laterality: N/A;   SHOULDER SURGERY Right 09/20/2009   TOTAL HIP ARTHROPLASTY Right 12/21/2020   Procedure: TOTAL HIP ARTHROPLASTY ANTERIOR APPROACH;  Surgeon: Hessie Knows, MD;  Location: [facility];  Service: Orthopedics;  Laterality: Right;    Social History   Tobacco Use   Smoking status: Former    Packs/day: 2.00    Years: 30.00    Total pack years: 60.00    Types: Cigarettes    Quit date: 2000    Years since quitting: 23.5   Smokeless tobacco: Current    Types: Chew  Vaping Use   Vaping Use: Never used  Substance Use Topics   Alcohol use: Yes  Alcohol/week: 12.0 standard drinks of alcohol    Types: 12 Cans of beer per week    Comment: OCC BEER   Drug use: No     Medication list has been reviewed and updated.  Current Meds  Medication Sig   acetaminophen (TYLENOL) 500 MG tablet Take 1,000 mg by mouth every 6 (six) hours as needed for moderate pain.    amLODipine-benazepril (LOTREL) 10-20 MG capsule Take 1 capsule by mouth daily.   atorvastatin (LIPITOR) 10 MG tablet Take 1 tablet (10 mg total) by mouth daily. TAKE 1 TABLET BY MOUTH EVERY DAY   buPROPion (WELLBUTRIN XL) 300 MG 24 hr tablet Take 300  mg by mouth daily.    busPIRone (BUSPAR) 30 MG tablet Take 30 mg by mouth 2 (two) times daily.    clopidogrel (PLAVIX) 75 MG tablet Take 1 tablet (75 mg total) by mouth every Monday, Wednesday, and Friday.   cyclobenzaprine (FLEXERIL) 5 MG tablet Take 1 tablet (5 mg total) by mouth at bedtime.   methylphenidate (RITALIN) 20 MG tablet Take 20 mg by mouth 2 (two) times daily with breakfast and lunch.    propranolol (INDERAL) 20 MG tablet Take 20-40 mg by mouth See admin instructions. 40 mg in the morning, 20 mg in the evening       11/16/2021   10:04 AM 05/18/2021   10:14 AM 11/10/2020    1:45 PM 03/09/2020    1:40 PM  GAD 7 : Generalized Anxiety Score  Nervous, Anxious, on Edge 1 0 0 0  Control/stop worrying 1 0 0 0  Worry too much - different things 0 0 0 0  Trouble relaxing 0 0 0 0  Restless 0 0 0 0  Easily annoyed or irritable 1 0 0 0  Afraid - awful might happen 0 0 0 0  Total GAD 7 Score 3 0 0 0  Anxiety Difficulty Not difficult at all Not difficult at all         11/16/2021   10:04 AM 05/18/2021   10:14 AM 11/10/2020    1:45 PM  Depression screen PHQ 2/9  Decreased Interest 1 0 0  Down, Depressed, Hopeless 1 0 0  PHQ - 2 Score 2 0 0  Altered sleeping 2 0 0  Tired, decreased energy 1 0 0  Change in appetite 0 0 0  Feeling bad or failure about yourself  0 0 0  Trouble concentrating 0 0 0  Moving slowly or fidgety/restless 0 0 0  Suicidal thoughts 0 0 0  PHQ-9 Score 5 0 0  Difficult doing work/chores Not difficult at all Not difficult at all     BP Readings from Last 3 Encounters:  11/16/21 128/60  05/18/21 130/80  12/22/20 115/67    Physical Exam Vitals and nursing note reviewed.  HENT:     Head: Normocephalic.     Right Ear: External ear normal.     Left Ear: External ear normal.     Nose: Nose normal.  Eyes:     General: No scleral icterus.       Right eye: No discharge.        Left eye: No discharge.     Conjunctiva/sclera: Conjunctivae normal.      Pupils: Pupils are equal, round, and reactive to light.  Neck:     Thyroid: No thyromegaly.     Vascular: No JVD.     Trachea: No tracheal deviation.  Cardiovascular:     Rate and Rhythm:  Normal rate and regular rhythm.     Heart sounds: Normal heart sounds. No murmur heard.    No friction rub. No gallop.  Pulmonary:     Effort: No respiratory distress.     Breath sounds: Normal breath sounds. No wheezing or rales.  Abdominal:     General: Bowel sounds are normal.     Palpations: Abdomen is soft. There is no mass.     Tenderness: There is no abdominal tenderness. There is no guarding or rebound.  Musculoskeletal:        General: No tenderness. Normal range of motion.     Cervical back: Normal range of motion and neck supple.     Lumbar back: Spasms present.  Lymphadenopathy:     Cervical: No cervical adenopathy.  Skin:    General: Skin is warm.     Findings: No rash.  Neurological:     Mental Status: He is alert.     Cranial Nerves: No cranial nerve deficit.     Wt Readings from Last 3 Encounters:  11/16/21 182 lb (82.6 kg)  05/18/21 183 lb (83 kg)  12/21/20 173 lb 15.1 oz (78.9 kg)    BP 128/60   Pulse 64   Ht 5' 2" (1.575 m)   Wt 182 lb (82.6 kg)   BMI 33.29 kg/m   Assessment and Plan:  1. Essential hypertension Chronic.  Controlled.  Stable.  Asymptomatic.  Tolerating medication well.  Blood pressure today is 128/60.  Patient provided DASH diet for weight loss and blood pressure control.  We will continue amlodipine benazepril 10-20 mg 1 capsule daily.  We will check renal function panel for electrolytes and GFR. - amLODipine-benazepril (LOTREL) 10-20 MG capsule; Take 1 capsule by mouth daily.  Dispense: 90 capsule; Refill: 1 - Renal Function Panel  2. Mixed hyperlipidemia Chronic.  Controlled.  Stable.  Continue atorvastatin 10 mg once a day. - atorvastatin (LIPITOR) 10 MG tablet; Take 1 tablet (10 mg total) by mouth daily. TAKE 1 TABLET BY MOUTH EVERY DAY   Dispense: 90 tablet; Refill: 1  3. Lumbar disc disease Chronic.  Controlled.  Stable.  Muscle spasm noted in the lumbar area which has some carryover particularly after coming in from a long drive.  We will continue cyclobenzaprine 5 mg nightly as needed. - cyclobenzaprine (FLEXERIL) 5 MG tablet; Take 1 tablet (5 mg total) by mouth at bedtime.  Dispense: 90 tablet; Refill: 1  4. Iron deficiency anemia, unspecified iron deficiency anemia type Chronic.  Controlled.  Stable.  Patient with history of a GI bleed presumably from NSAIDs.  Patient does not take these now for back pain we will check CBC to monitor current hemoglobin status. - CBC w/Diff/Platelet

## 2021-11-16 NOTE — Patient Instructions (Signed)

## 2021-11-17 LAB — RENAL FUNCTION PANEL
Albumin: 4.1 g/dL (ref 3.9–4.9)
BUN/Creatinine Ratio: 11 (ref 10–24)
BUN: 13 mg/dL (ref 8–27)
CO2: 22 mmol/L (ref 20–29)
Calcium: 9.5 mg/dL (ref 8.6–10.2)
Chloride: 107 mmol/L — ABNORMAL HIGH (ref 96–106)
Creatinine, Ser: 1.19 mg/dL (ref 0.76–1.27)
Glucose: 111 mg/dL — ABNORMAL HIGH (ref 70–99)
Phosphorus: 3.1 mg/dL (ref 2.8–4.1)
Potassium: 4.6 mmol/L (ref 3.5–5.2)
Sodium: 143 mmol/L (ref 134–144)
eGFR: 69 mL/min/{1.73_m2} (ref >59)

## 2021-11-17 LAB — CBC WITH DIFFERENTIAL/PLATELET
Basophils Absolute: 0.1 10*3/uL (ref 0.0–0.2)
Basos: 1 %
EOS (ABSOLUTE): 0.2 10*3/uL (ref 0.0–0.4)
Eos: 2 %
Hematocrit: 44.4 % (ref 37.5–51.0)
Hemoglobin: 14.8 g/dL (ref 13.0–17.7)
Immature Grans (Abs): 0 10*3/uL (ref 0.0–0.1)
Immature Granulocytes: 0 %
Lymphocytes Absolute: 1.6 10*3/uL (ref 0.7–3.1)
Lymphs: 20 %
MCH: 31.6 pg (ref 26.6–33.0)
MCHC: 33.3 g/dL (ref 31.5–35.7)
MCV: 95 fL (ref 79–97)
Monocytes Absolute: 0.9 10*3/uL (ref 0.1–0.9)
Monocytes: 11 %
Neutrophils Absolute: 5.4 10*3/uL (ref 1.4–7.0)
Neutrophils: 66 %
Platelets: 220 10*3/uL (ref 150–450)
RBC: 4.68 x10E6/uL (ref 4.14–5.80)
RDW: 12.3 % (ref 11.6–15.4)
WBC: 8.1 10*3/uL (ref 3.4–10.8)

## 2022-02-25 ENCOUNTER — Telehealth: Payer: Self-pay | Admitting: Family Medicine

## 2022-02-25 NOTE — Telephone Encounter (Signed)
Patient came into office requesting a note for [employer]. Patient stated please give him a call when its ready.

## 2022-05-17 ENCOUNTER — Encounter: Payer: Self-pay | Admitting: Family Medicine

## 2022-05-17 ENCOUNTER — Ambulatory Visit (INDEPENDENT_AMBULATORY_CARE_PROVIDER_SITE_OTHER): Payer: BC Managed Care – PPO | Admitting: Family Medicine

## 2022-05-17 VITALS — BP 124/62 | HR 66 | Ht 62.0 in | Wt 182.0 lb

## 2022-05-17 DIAGNOSIS — E782 Mixed hyperlipidemia: Secondary | ICD-10-CM | POA: Diagnosis not present

## 2022-05-17 DIAGNOSIS — M519 Unspecified thoracic, thoracolumbar and lumbosacral intervertebral disc disorder: Secondary | ICD-10-CM | POA: Diagnosis not present

## 2022-05-17 DIAGNOSIS — I10 Essential (primary) hypertension: Secondary | ICD-10-CM | POA: Diagnosis not present

## 2022-05-17 MED ORDER — CYCLOBENZAPRINE HCL 5 MG PO TABS: 5.0000 mg | ORAL_TABLET | Freq: Every day | ORAL | 1 refills | Status: DC

## 2022-05-17 MED ORDER — AMLODIPINE BESY-BENAZEPRIL HCL 10-20 MG PO CAPS: 1.0000 | ORAL_CAPSULE | Freq: Every day | ORAL | 1 refills | Status: DC

## 2022-05-17 MED ORDER — ATORVASTATIN CALCIUM 10 MG PO TABS: 10.0000 mg | ORAL_TABLET | Freq: Every day | ORAL | 1 refills | Status: DC

## 2022-05-17 NOTE — Progress Notes (Signed)
Date:  05/17/2022   Name:  Alan Trevino   DOB:  1960/12/10   MRN:  [id number]   Chief Complaint: Hyperlipidemia and Hypertension  Hyperlipidemia This is a chronic problem. The current episode started more than 1 year ago. The problem is controlled. Recent lipid tests were reviewed and are normal. He has no history of chronic renal disease, diabetes, hypothyroidism, liver disease, obesity or nephrotic syndrome. Pertinent negatives include no chest pain, focal sensory loss, focal weakness, leg pain, myalgias or shortness of breath. Current antihyperlipidemic treatment includes statins. The current treatment provides moderate improvement of lipids. There are no compliance problems.  Risk factors for coronary artery disease include dyslipidemia and hypertension.  Hypertension This is a chronic problem. The current episode started more than 1 year ago. The problem is controlled. Pertinent negatives include no chest pain, headaches, neck pain, palpitations or shortness of breath. Agents associated with hypertension include amphetamines. Risk factors for coronary artery disease include dyslipidemia. Past treatments include ACE inhibitors and calcium channel blockers. The current treatment provides moderate improvement. There are no compliance problems.  There is no history of angina, kidney disease, CAD/MI, CVA, heart failure, left ventricular hypertrophy, PVD or retinopathy. There is no history of chronic renal disease, a hypertension causing med or renovascular disease.    Lab Results  Component Value Date   NA 143 11/16/2021   K 4.6 11/16/2021   CO2 22 11/16/2021   GLUCOSE 111 (H) 11/16/2021   BUN 13 11/16/2021   CREATININE 1.19 11/16/2021   CALCIUM 9.5 11/16/2021   EGFR 69 11/16/2021   GFRNONAA >60 12/22/2020   Lab Results  Component Value Date   CHOL 157 05/18/2021   HDL 43 05/18/2021   LDLCALC 90 05/18/2021   TRIG 136 05/18/2021   CHOLHDL 5.6 (H) 02/01/2019   No results found  for: "TSH" No results found for: "HGBA1C" Lab Results  Component Value Date   WBC 8.1 11/16/2021   HGB 14.8 11/16/2021   HCT 44.4 11/16/2021   MCV 95 11/16/2021   PLT 220 11/16/2021   Lab Results  Component Value Date   ALT 23 05/18/2021   AST 28 05/18/2021   ALKPHOS 72 05/18/2021   BILITOT 0.3 05/18/2021   No results found for: "25OHVITD2", "25OHVITD3", "VD25OH"   Review of Systems  Constitutional:  Negative for chills and fever.  HENT:  Negative for drooling, ear discharge, ear pain and sore throat.   Respiratory:  Negative for cough, shortness of breath and wheezing.   Cardiovascular:  Negative for chest pain, palpitations and leg swelling.  Gastrointestinal:  Negative for abdominal pain, blood in stool, constipation, diarrhea and nausea.  Endocrine: Negative for polydipsia.  Genitourinary:  Negative for dysuria, frequency, hematuria and urgency.  Musculoskeletal:  Negative for back pain, myalgias and neck pain.  Skin:  Negative for rash.  Allergic/Immunologic: Negative for environmental allergies.  Neurological:  Negative for dizziness, focal weakness and headaches.  Hematological:  Does not bruise/bleed easily.  Psychiatric/Behavioral:  Negative for suicidal ideas. The patient is not nervous/anxious.     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   Personal history of colonic polyps    Acute peptic ulcer without hemorrhage and perforation    Hiatal hernia    Esophagogastric ulcer    Stricture and stenosis of esophagus    GIB (gastrointestinal bleeding) 11/30/2016   Hematemesis without nausea    Acute esophagogastric ulcer  Upper GI bleed 11/28/2016   Acute recurrent maxillary sinusitis 09/07/2016   Special screening for malignant neoplasms, colon    Benign neoplasm of ascending colon    Benign neoplasm of transverse colon    Benign neoplasm of descending colon    Rectal polyp    Lumbar disc  disease 11/03/2015   Iron deficiency anemia 08/25/2014   Acute gastrointestinal bleeding 08/25/2014   Acute back pain with sciatica 08/25/2014   Routine general medical examination at a health care facility 08/25/2014   DDD (degenerative disc disease), lumbosacral 08/25/2014   Recurrent major depressive episodes ([place]) 08/25/2014   Essential (primary) hypertension 08/25/2014   HLD (hyperlipidemia) 08/25/2014    No Known Allergies  Past Surgical History:  Procedure Laterality Date   COLONOSCOPY WITH PROPOFOL N/A 12/29/2015   Procedure: COLONOSCOPY WITH PROPOFOL;  Surgeon: [name], MD;  Location: [place];  Service: Endoscopy;  Laterality: N/A;   COLONOSCOPY WITH PROPOFOL N/A 11/29/2019   Procedure: COLONOSCOPY WITH PROPOFOL;  Surgeon: [name], MD;  Location: [place];  Service: Endoscopy;  Laterality: N/A;  priority 4   ESOPHAGEAL DILATION     stretching   ESOPHAGEAL DILATION  01/17/2017   Procedure: ESOPHAGEAL DILATION;  Surgeon: [name], MD;  Location: [place];  Service: Gastroenterology;;   ESOPHAGOGASTRODUODENOSCOPY (EGD) WITH PROPOFOL N/A 11/29/2016   Procedure: ESOPHAGOGASTRODUODENOSCOPY (EGD) WITH PROPOFOL;  Surgeon: [name], MD;  Location: [facility];  Service: Endoscopy;  Laterality: N/A;   ESOPHAGOGASTRODUODENOSCOPY (EGD) WITH PROPOFOL N/A 01/17/2017   Procedure: ESOPHAGOGASTRODUODENOSCOPY (EGD) WITH PROPOFOL;  Surgeon: [name], MD;  Location: [place];  Service: Gastroenterology;  Laterality: N/A;   HERNIA REPAIR Right    INGUINAL HERNIA REPAIR Left 01/14/2020   Procedure: HERNIA REPAIR INGUINAL ADULT;  Surgeon: Robert Bellow, MD;  Location: [facility];  Service: General;  Laterality: Left;   POLYPECTOMY N/A 12/29/2015   Procedure: POLYPECTOMY;  Surgeon: [name], MD;  Location: [place];  Service: Endoscopy;  Laterality: N/A;   POLYPECTOMY  01/17/2017   Procedure: POLYPECTOMY;   Surgeon: [name], MD;  Location: [place];  Service: Gastroenterology;;   POLYPECTOMY N/A 11/29/2019   Procedure: POLYPECTOMY;  Surgeon: [name], MD;  Location: [place];  Service: Endoscopy;  Laterality: N/A;   SHOULDER SURGERY Right 09/20/2009   TOTAL HIP ARTHROPLASTY Right 12/21/2020   Procedure: TOTAL HIP ARTHROPLASTY ANTERIOR APPROACH;  Surgeon: Hessie Knows, MD;  Location: [facility];  Service: Orthopedics;  Laterality: Right;    Social History   Tobacco Use   Smoking status: Former    Packs/day: 2.00    Years: 30.00    Total pack years: 60.00    Types: Cigarettes    Quit date: 2000    Years since quitting: 24.0   Smokeless tobacco: Current    Types: Chew  Vaping Use   Vaping Use: Never used  Substance Use Topics   Alcohol use: Yes    Alcohol/week: 12.0 standard drinks of alcohol    Types: 12 Cans of beer per week    Comment: OCC BEER   Drug use: No     Medication list has been reviewed and updated.  Current Meds  Medication Sig   acetaminophen (TYLENOL) 500 MG tablet Take 1,000 mg by mouth every 6 (six) hours as needed for moderate pain.    amLODipine-benazepril (LOTREL) 10-20 MG capsule Take 1 capsule by mouth daily.   atorvastatin (LIPITOR) 10 MG tablet Take 1 tablet (10 mg  total) by mouth daily. TAKE 1 TABLET BY MOUTH EVERY DAY   buPROPion (WELLBUTRIN XL) 300 MG 24 hr tablet Take 300 mg by mouth daily.    busPIRone (BUSPAR) 30 MG tablet Take 30 mg by mouth 2 (two) times daily.    clopidogrel (PLAVIX) 75 MG tablet Take 1 tablet (75 mg total) by mouth every Monday, Wednesday, and Friday.   cyclobenzaprine (FLEXERIL) 5 MG tablet Take 1 tablet (5 mg total) by mouth at bedtime.   methylphenidate (RITALIN) 20 MG tablet Take 20 mg by mouth 2 (two) times daily with breakfast and lunch.    propranolol (INDERAL) 20 MG tablet Take 20-40 mg by mouth See admin instructions. 40 mg in the morning, 20 mg in the evening   rizatriptan (MAXALT-MLT) 10  MG disintegrating tablet Take 10 mg by mouth as needed.       05/17/2022    3:08 PM 11/16/2021   10:04 AM 05/18/2021   10:14 AM 11/10/2020    1:45 PM  GAD 7 : Generalized Anxiety Score  Nervous, Anxious, on Edge 0 1 0 0  Control/stop worrying 0 1 0 0  Worry too much - different things 0 0 0 0  Trouble relaxing 0 0 0 0  Restless 0 0 0 0  Easily annoyed or irritable 0 1 0 0  Afraid - awful might happen 0 0 0 0  Total GAD 7 Score 0 3 0 0  Anxiety Difficulty Not difficult at all Not difficult at all Not difficult at all        05/17/2022    3:08 PM 11/16/2021   10:04 AM 05/18/2021   10:14 AM  Depression screen PHQ 2/9  Decreased Interest 1 1 0  Down, Depressed, Hopeless 1 1 0  PHQ - 2 Score 2 2 0  Altered sleeping 0 2 0  Tired, decreased energy 0 1 0  Change in appetite 0 0 0  Feeling bad or failure about yourself  0 0 0  Trouble concentrating 0 0 0  Moving slowly or fidgety/restless 0 0 0  Suicidal thoughts 0 0 0  PHQ-9 Score 2 5 0  Difficult doing work/chores Not difficult at all Not difficult at all Not difficult at all    BP Readings from Last 3 Encounters:  05/17/22 124/62  11/16/21 128/60  05/18/21 130/80    Physical Exam Vitals and nursing note reviewed.  HENT:     Head: Normocephalic.     Right Ear: External ear normal.     Left Ear: External ear normal.     Nose: Nose normal.     Mouth/Throat:     Mouth: Mucous membranes are moist.  Eyes:     General: No scleral icterus.       Right eye: No discharge.        Left eye: No discharge.     Conjunctiva/sclera: Conjunctivae normal.     Pupils: Pupils are equal, round, and reactive to light.  Neck:     Thyroid: No thyromegaly.     Vascular: No JVD.     Trachea: No tracheal deviation.  Cardiovascular:     Rate and Rhythm: Normal rate and regular rhythm.     Heart sounds: Normal heart sounds. No murmur heard.    No friction rub. No gallop.  Pulmonary:     Effort: No respiratory distress.     Breath sounds:  Normal breath sounds. No wheezing, rhonchi or rales.  Abdominal:     General:  Bowel sounds are normal.     Palpations: Abdomen is soft. There is no mass.     Tenderness: There is no abdominal tenderness. There is no guarding or rebound.  Musculoskeletal:        General: No tenderness. Normal range of motion.     Cervical back: Normal range of motion and neck supple.  Lymphadenopathy:     Cervical: No cervical adenopathy.  Skin:    General: Skin is warm.     Findings: No rash.  Neurological:     Mental Status: He is alert.     Wt Readings from Last 3 Encounters:  05/17/22 182 lb (82.6 kg)  11/16/21 182 lb (82.6 kg)  05/18/21 183 lb (83 kg)    BP 124/62   Pulse 66   Ht '5\' 2"'$  (1.575 m)   Wt 182 lb (82.6 kg)   SpO2 97%   BMI 33.29 kg/m   Assessment and Plan: 1. Essential hypertension Chronic.  Controlled.  Stable.  Blood pressure 124/62.  Tolerating medication well and will continue combination amlodipine benazepril 10-20 mg once a day and will recheck in 6 months.  Review of previous renal panel is acceptable. - amLODipine-benazepril (LOTREL) 10-20 MG capsule; Take 1 capsule by mouth daily.  Dispense: 90 capsule; Refill: 1  2. Mixed hyperlipidemia Chronic.  Controlled.  Stable.  Continue atorvastatin 10 mg once a day.  Will check lipid panel in 6 months. - atorvastatin (LIPITOR) 10 MG tablet; Take 1 tablet (10 mg total) by mouth daily. TAKE 1 TABLET BY MOUTH EVERY DAY  Dispense: 90 tablet; Refill: 1  3. Lumbar disc disease Occasional taking of cyclobenzaprine will refill today and use on a as needed basis at night when there is tightness in the back. - cyclobenzaprine (FLEXERIL) 5 MG tablet; Take 1 tablet (5 mg total) by mouth at bedtime.  Dispense: 90 tablet; Refill: 1     Otilio Miu, MD

## 2022-05-20 ENCOUNTER — Ambulatory Visit: Payer: BC Managed Care – PPO | Admitting: Family Medicine

## 2022-11-15 ENCOUNTER — Ambulatory Visit (INDEPENDENT_AMBULATORY_CARE_PROVIDER_SITE_OTHER): Payer: BC Managed Care – PPO | Admitting: Family Medicine

## 2022-11-15 ENCOUNTER — Encounter: Payer: Self-pay | Admitting: Family Medicine

## 2022-11-15 ENCOUNTER — Ambulatory Visit: Admission: RE | Admit: 2022-11-15 | Payer: BC Managed Care – PPO | Source: Home / Self Care

## 2022-11-15 ENCOUNTER — Ambulatory Visit: Admission: RE | Admit: 2022-11-15 | Payer: BC Managed Care – PPO | Source: Ambulatory Visit

## 2022-11-15 VITALS — BP 118/68 | HR 80 | Ht 63.0 in | Wt 179.0 lb

## 2022-11-15 DIAGNOSIS — I10 Essential (primary) hypertension: Secondary | ICD-10-CM

## 2022-11-15 DIAGNOSIS — M519 Unspecified thoracic, thoracolumbar and lumbosacral intervertebral disc disorder: Secondary | ICD-10-CM | POA: Insufficient documentation

## 2022-11-15 DIAGNOSIS — E782 Mixed hyperlipidemia: Secondary | ICD-10-CM

## 2022-11-15 DIAGNOSIS — M533 Sacrococcygeal disorders, not elsewhere classified: Secondary | ICD-10-CM | POA: Insufficient documentation

## 2022-11-15 MED ORDER — AMLODIPINE BESY-BENAZEPRIL HCL 10-20 MG PO CAPS: 1.0000 | ORAL_CAPSULE | Freq: Every day | ORAL | 1 refills | Status: DC

## 2022-11-15 MED ORDER — ATORVASTATIN CALCIUM 10 MG PO TABS: 10.0000 mg | ORAL_TABLET | Freq: Every day | ORAL | 1 refills | Status: DC

## 2022-11-15 MED ORDER — CYCLOBENZAPRINE HCL 5 MG PO TABS: 5.0000 mg | ORAL_TABLET | Freq: Every day | ORAL | 1 refills | Status: DC

## 2022-11-15 NOTE — Progress Notes (Signed)
Date:  11/15/2022   Name:  Alan Trevino   DOB:  08-25-1960   MRN:  [id number]   Chief Complaint: Hypertension, Hyperlipidemia, and Back Pain (Muscle spasms)  Hypertension This is a chronic problem. The current episode started more than 1 year ago. The problem has been gradually improving since onset. The problem is controlled. Pertinent negatives include no anxiety, blurred vision, chest pain, headaches, malaise/fatigue, neck pain, orthopnea, palpitations, peripheral edema, PND, shortness of breath or sweats. There are no associated agents to hypertension. Risk factors for coronary artery disease include dyslipidemia. Past treatments include ACE inhibitors and calcium channel blockers. The current treatment provides moderate improvement. There are no compliance problems.  There is no history of CAD/MI, CVA or PVD. There is no history of chronic renal disease, a hypertension causing med or renovascular disease.  Hyperlipidemia This is a chronic problem. The current episode started more than 1 year ago. The problem is controlled. Recent lipid tests were reviewed and are normal. He has no history of chronic renal disease. Pertinent negatives include no chest pain or shortness of breath. Current antihyperlipidemic treatment includes statins. The current treatment provides moderate improvement of lipids. There are no compliance problems.   Back Pain This is a chronic problem. The current episode started more than 1 year ago. The problem occurs intermittently. The problem has been gradually improving since onset. The pain is present in the lumbar spine. The quality of the pain is described as burning. The pain is moderate. The symptoms are aggravated by sitting. Pertinent negatives include no abdominal pain, chest pain, fever or headaches.    Lab Results  Component Value Date   NA 143 11/16/2021   K 4.6 11/16/2021   CO2 22 11/16/2021   GLUCOSE 111 (H) 11/16/2021   BUN 13 11/16/2021   CREATININE  1.19 11/16/2021   CALCIUM 9.5 11/16/2021   EGFR 69 11/16/2021   GFRNONAA >60 12/22/2020   Lab Results  Component Value Date   CHOL 157 05/18/2021   HDL 43 05/18/2021   LDLCALC 90 05/18/2021   TRIG 136 05/18/2021   CHOLHDL 5.6 (H) 02/01/2019   No results found for: "TSH" No results found for: "HGBA1C" Lab Results  Component Value Date   WBC 8.1 11/16/2021   HGB 14.8 11/16/2021   HCT 44.4 11/16/2021   MCV 95 11/16/2021   PLT 220 11/16/2021   Lab Results  Component Value Date   ALT 23 05/18/2021   AST 28 05/18/2021   ALKPHOS 72 05/18/2021   BILITOT 0.3 05/18/2021   No results found for: "25OHVITD2", "25OHVITD3", "VD25OH"   Review of Systems  Constitutional:  Negative for chills, fever and malaise/fatigue.  HENT:  Positive for congestion.   Eyes:  Negative for blurred vision and visual disturbance.  Respiratory:  Negative for cough, chest tightness, shortness of breath and wheezing.   Cardiovascular:  Negative for chest pain, palpitations, orthopnea, leg swelling and PND.  Gastrointestinal:  Negative for abdominal pain and blood in stool.  Genitourinary:  Negative for difficulty urinating and hematuria.  Musculoskeletal:  Positive for back pain. Negative for neck pain.  Neurological:  Negative for headaches.    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   Personal history of colonic polyps    Acute peptic ulcer without hemorrhage and perforation    Hiatal hernia    Esophagogastric ulcer    Stricture and stenosis of esophagus  GIB (gastrointestinal bleeding) 11/30/2016   Hematemesis without nausea    Acute esophagogastric ulcer    Upper GI bleed 11/28/2016   Acute recurrent maxillary sinusitis 09/07/2016   Special screening for malignant neoplasms, colon    Benign neoplasm of ascending colon    Benign neoplasm of transverse colon    Benign neoplasm of descending colon    Rectal polyp    Lumbar  disc disease 11/03/2015   Iron deficiency anemia 08/25/2014   Acute gastrointestinal bleeding 08/25/2014   Acute back pain with sciatica 08/25/2014   Routine general medical examination at a health care facility 08/25/2014   DDD (degenerative disc disease), lumbosacral 08/25/2014   Recurrent major depressive episodes (HCC) 08/25/2014   Essential (primary) hypertension 08/25/2014   HLD (hyperlipidemia) 08/25/2014    No Known Allergies  Past Surgical History:  Procedure Laterality Date   COLONOSCOPY WITH PROPOFOL N/A 12/29/2015   Procedure: COLONOSCOPY WITH PROPOFOL;  Surgeon: Midge Minium, MD;  Location: [facility];  Service: Endoscopy;  Laterality: N/A;   COLONOSCOPY WITH PROPOFOL N/A 11/29/2019   Procedure: COLONOSCOPY WITH PROPOFOL;  Surgeon: Midge Minium, MD;  Location: [facility];  Service: Endoscopy;  Laterality: N/A;  priority 4   ESOPHAGEAL DILATION     stretching   ESOPHAGEAL DILATION  01/17/2017   Procedure: ESOPHAGEAL DILATION;  Surgeon: Midge Minium, MD;  Location: [facility];  Service: Gastroenterology;;   ESOPHAGOGASTRODUODENOSCOPY (EGD) WITH PROPOFOL N/A 11/29/2016   Procedure: ESOPHAGOGASTRODUODENOSCOPY (EGD) WITH PROPOFOL;  Surgeon: Midge Minium, MD;  Location: [facility] END[facility];  Service: Endoscopy;  Laterality: N/A;   ESOPHAGOGASTRODUODENOSCOPY (EGD) WITH PROPOFOL N/A 01/17/2017   Procedure: ESOPHAGOGASTRODUODENOSCOPY (EGD) WITH PROPOFOL;  Surgeon: Midge Minium, MD;  Location: [facility];  Service: Gastroenterology;  Laterality: N/A;   HERNIA REPAIR Right    INGUINAL HERNIA REPAIR Left 01/14/2020   Procedure: HERNIA REPAIR INGUINAL ADULT;  Surgeon: Earline Mayotte, MD;  Location: [facility] ORS;  Service: General;  Laterality: Left;   POLYPECTOMY N/A 12/29/2015   Procedure: POLYPECTOMY;  Surgeon: Midge Minium, MD;  Location: [facility];  Service: Endoscopy;  Laterality: N/A;   POLYPECTOMY  01/17/2017   Procedure: POLYPECTOMY;   Surgeon: Midge Minium, MD;  Location: [facility];  Service: Gastroenterology;;   POLYPECTOMY N/A 11/29/2019   Procedure: POLYPECTOMY;  Surgeon: Midge Minium, MD;  Location: [facility];  Service: Endoscopy;  Laterality: N/A;   SHOULDER SURGERY Right 09/20/2009   TOTAL HIP ARTHROPLASTY Right 12/21/2020   Procedure: TOTAL HIP ARTHROPLASTY ANTERIOR APPROACH;  Surgeon: Kennedy Bucker, MD;  Location: [facility] ORS;  Service: Orthopedics;  Laterality: Right;    Social History   Tobacco Use   Smoking status: Former    Current packs/day: 0.00    Average packs/day: 2.0 packs/day for 30.0 years (60.0 ttl pk-yrs)    Types: Cigarettes    Start date: 21    Quit date: 2000    Years since quitting: 24.5   Smokeless tobacco: Current    Types: Chew  Vaping Use   Vaping status: Never Used  Substance Use Topics   Alcohol use: Yes    Alcohol/week: 12.0 standard drinks of alcohol    Types: 12 Cans of beer per week    Comment: OCC BEER   Drug use: No     Medication list has been reviewed and updated.  Current Meds  Medication Sig   acetaminophen (TYLENOL) 500 MG tablet Take 1,000 mg by mouth every 6 (six) hours as needed for moderate pain.  amLODipine-benazepril (LOTREL) 10-20 MG capsule Take 1 capsule by mouth daily.   atorvastatin (LIPITOR) 10 MG tablet Take 1 tablet (10 mg total) by mouth daily. TAKE 1 TABLET BY MOUTH EVERY DAY   buPROPion (WELLBUTRIN XL) 300 MG 24 hr tablet Take 300 mg by mouth daily.    busPIRone (BUSPAR) 30 MG tablet Take 30 mg by mouth 2 (two) times daily.    cyclobenzaprine (FLEXERIL) 5 MG tablet Take 1 tablet (5 mg total) by mouth at bedtime.   methylphenidate (RITALIN) 20 MG tablet Take 20 mg by mouth 2 (two) times daily with breakfast and lunch.    [DISCONTINUED] clopidogrel (PLAVIX) 75 MG tablet Take 1 tablet (75 mg total) by mouth every Monday, Wednesday, and Friday.   [DISCONTINUED] propranolol (INDERAL) 20 MG tablet Take 20-40 mg by mouth See admin  instructions. 40 mg in the morning, 20 mg in the evening   [DISCONTINUED] rizatriptan (MAXALT-MLT) 10 MG disintegrating tablet Take 10 mg by mouth as needed.       11/15/2022    1:50 PM 05/17/2022    3:08 PM 11/16/2021   10:04 AM 05/18/2021   10:14 AM  GAD 7 : Generalized Anxiety Score  Nervous, Anxious, on Edge 0 0 1 0  Control/stop worrying 0 0 1 0  Worry too much - different things 0 0 0 0  Trouble relaxing 0 0 0 0  Restless 0 0 0 0  Easily annoyed or irritable 0 0 1 0  Afraid - awful might happen 0 0 0 0  Total GAD 7 Score 0 0 3 0  Anxiety Difficulty Not difficult at all Not difficult at all Not difficult at all Not difficult at all       11/15/2022    1:50 PM 05/17/2022    3:08 PM 11/16/2021   10:04 AM  Depression screen PHQ 2/9  Decreased Interest 0 1 1  Down, Depressed, Hopeless 0 1 1  PHQ - 2 Score 0 2 2  Altered sleeping 0 0 2  Tired, decreased energy 0 0 1  Change in appetite 0 0 0  Feeling bad or failure about yourself  0 0 0  Trouble concentrating 0 0 0  Moving slowly or fidgety/restless 0 0 0  Suicidal thoughts 0 0 0  PHQ-9 Score 0 2 5  Difficult doing work/chores Not difficult at all Not difficult at all Not difficult at all    BP Readings from Last 3 Encounters:  11/15/22 118/68  05/17/22 124/62  11/16/21 128/60    Physical Exam Vitals and nursing note reviewed.  HENT:     Head: Normocephalic.     Right Ear: Tympanic membrane and external ear normal.     Left Ear: Tympanic membrane and external ear normal.     Nose: Nose normal.  Eyes:     General: No scleral icterus.       Right eye: No discharge.        Left eye: No discharge.     Conjunctiva/sclera: Conjunctivae normal.     Pupils: Pupils are equal, round, and reactive to light.  Neck:     Thyroid: No thyromegaly.     Vascular: No JVD.     Trachea: No tracheal deviation.  Cardiovascular:     Rate and Rhythm: Normal rate and regular rhythm.     Heart sounds: Normal heart sounds, S1 normal  and S2 normal. No murmur heard.    No systolic murmur is present.  No diastolic murmur is present.     No friction rub. No gallop. No S3 or S4 sounds.  Pulmonary:     Effort: No respiratory distress.     Breath sounds: Normal breath sounds. No wheezing or rales.  Abdominal:     General: Bowel sounds are normal.     Palpations: Abdomen is soft. There is no mass.     Tenderness: There is no abdominal tenderness. There is no guarding or rebound.  Genitourinary:    Prostate: Normal. Not enlarged, not tender and no nodules present.     Rectum: Normal. Guaiac result negative. No mass.  Musculoskeletal:        General: Normal range of motion.     Cervical back: Normal range of motion and neck supple.     Lumbar back: Spasms, tenderness and bony tenderness present. Normal range of motion. Negative right straight leg raise test and negative left straight leg raise test.     Comments: Coccyx tenderness  Lymphadenopathy:     Cervical: No cervical adenopathy.  Skin:    General: Skin is warm.     Findings: No rash.  Neurological:     Mental Status: He is alert.     Wt Readings from Last 3 Encounters:  11/15/22 179 lb (81.2 kg)  05/17/22 182 lb (82.6 kg)  11/16/21 182 lb (82.6 kg)    BP 118/68   Pulse 80   Ht 5\' 3"  (1.6 m)   Wt 179 lb (81.2 kg)   SpO2 97%   BMI 31.71 kg/m   Assessment and Plan:  1. Essential hypertension Chronic.  Controlled.  Stable.  Blood pressure today 118/68.  Asymptomatic.  Tolerating medication well.  Continue amlodipine benazepril 10-20 mg once a day.  Will recheck in 6 months.  Will check CMP for electrolytes and GFR. - amLODipine-benazepril (LOTREL) 10-20 MG capsule; Take 1 capsule by mouth daily.  Dispense: 90 capsule; Refill: 1 - Comprehensive Metabolic Panel (CMET)  2. Mixed hyperlipidemia Chronic.  Controlled.  Stable.  Continue atorvastatin 10 mg once a day.  Will check lipid panel for current status of LDL control. - atorvastatin (LIPITOR) 10  MG tablet; Take 1 tablet (10 mg total) by mouth daily. TAKE 1 TABLET BY MOUTH EVERY DAY  Dispense: 90 tablet; Refill: 1 - Lipid Panel With LDL/HDL Ratio  3. Lumbar disc disease Chronic.  Controlled.  Stable.  Continue cyclobenzaprine 5 mg nightly at night for muscle relaxation.  Will check x-ray of the lumbar spine given that patient had an injury in which she went down long slide and then landed on in the sitting position sustaining discomfort in the lower back.  Main concern is of compression fracture and we will obtain an LS spine.. - cyclobenzaprine (FLEXERIL) 5 MG tablet; Take 1 tablet (5 mg total) by mouth at bedtime.  Dispense: 90 tablet; Refill: 1 - DG Lumbar Spine Complete; Future  4. Coccyx pain Patient had issues with pain of the coccyx which may have been contributed to this at this time there is concern that there may be an issue with that but we will need to watchful wait at this time but we will get an x-ray to see if we have a glimpse of the coccyx. - DG Lumbar Spine Complete; Future    Elizabeth Sauer, MD

## 2022-11-21 ENCOUNTER — Telehealth: Payer: Self-pay | Admitting: Family Medicine

## 2022-11-21 NOTE — Telephone Encounter (Signed)
Copied from CRM [id number]. Topic: General - Inquiry >> Nov 21, 2022  3:53 PM Marlow Baars wrote: Reason for CRM: The patient called in inquiring about his results of his complete lumbar spine x rays he took last Friday. Please call patient with results.

## 2022-11-22 ENCOUNTER — Ambulatory Visit: Payer: BC Managed Care – PPO | Admitting: Family Medicine

## 2023-01-20 ENCOUNTER — Encounter: Payer: Self-pay | Admitting: Family Medicine

## 2023-01-20 ENCOUNTER — Telehealth: Payer: Self-pay | Admitting: Family Medicine

## 2023-01-20 NOTE — Telephone Encounter (Signed)
Copied from CRM [id number]. Topic: General - Other >> Jan 20, 2023  8:20 AM Everette C wrote: Reason for CRM: The patient has called to request completion of a letter that states that they are okay to drive while taking their current medications for their upcoming DOT physical   The patient shares that their PCP has previously written them a letter stating their approval for past physicals and would like a new one for the current year   The patient would like to be contacted by a member of clinical staff to follow up on their request when possible   Please contact further when possible

## 2023-01-20 NOTE — Telephone Encounter (Signed)
Patient informed letter ready for pick up

## 2023-01-31 ENCOUNTER — Ambulatory Visit: Payer: BC Managed Care – PPO

## 2023-01-31 ENCOUNTER — Ambulatory Visit
Admission: EM | Admit: 2023-01-31 | Discharge: 2023-01-31 | Disposition: A | Discharge status: Home / Self Care | Payer: BC Managed Care – PPO | Attending: Physician Assistant | Admitting: Physician Assistant

## 2023-01-31 ENCOUNTER — Encounter: Payer: Self-pay | Admitting: Emergency Medicine

## 2023-01-31 DIAGNOSIS — L03031 Cellulitis of right toe: Secondary | ICD-10-CM | POA: Diagnosis not present

## 2023-01-31 DIAGNOSIS — L819 Disorder of pigmentation, unspecified: Secondary | ICD-10-CM | POA: Diagnosis not present

## 2023-01-31 DIAGNOSIS — M79674 Pain in right toe(s): Secondary | ICD-10-CM

## 2023-01-31 MED ORDER — CEPHALEXIN 500 MG PO CAPS: 500.0000 mg | ORAL_CAPSULE | Freq: Four times a day (QID) | ORAL | 0 refills | Status: AC

## 2023-01-31 NOTE — Discharge Instructions (Addendum)
-  I will call if the x-ray is abnormal.  -Start antibiotics for possible infection -Soak foot daily in warm epsom salt x 20 min -Elevate extremity -Tylenol for pain -Follow up with podiatry when they call -Go to ER if worsening pain, swelling, fever, cold toes, streaking up foot

## 2023-01-31 NOTE — ED Triage Notes (Signed)
Patient reports redness and pain in his right big toe that started a week ago.  Patient denies injury or fall.

## 2023-01-31 NOTE — ED Provider Notes (Signed)
[facility]    CSN: [id number] Arrival date & time: 01/31/23  1257      History   Chief Complaint Chief Complaint  Patient presents with   Toe Pain    Right big toe    HPI ROI Alan Trevino is a 62 y.o. male with history of anemia, hypertension, hyperlipidemia, GERD.  He presents today for approximately 1 week history of redness and bluish discoloration of the right great toe.  He says it hurts on the inside when he touches it.  Denies any significant or severe pain.  Reports occasional sharp pains.  No drainage or bleeding.  No numbness, weakness or tingling.  He does not call any previous injury.  Denies any history of bleeding or clotting disorders.  No history of diabetes.  Has not really been treating condition.  Feels that it is getting worse.  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   History of colonic polyps    Acute peptic ulcer without hemorrhage and perforation    Hiatal hernia    Esophagogastric ulcer    Stricture and stenosis of esophagus    GIB (gastrointestinal bleeding) 11/30/2016   Hematemesis without nausea    Acute esophagogastric ulcer    Upper GI bleed 11/28/2016   Acute recurrent maxillary sinusitis 09/07/2016   Special screening for malignant neoplasms, colon    Benign neoplasm of ascending colon    Benign neoplasm of transverse colon    Benign neoplasm of descending colon    Rectal polyp    Lumbar disc disease 11/03/2015   Iron deficiency anemia 08/25/2014   Acute gastrointestinal bleeding 08/25/2014   Acute back pain with sciatica 08/25/2014   Routine general medical examination at a health care facility 08/25/2014    DDD (degenerative disc disease), lumbosacral 08/25/2014   Recurrent major depressive episodes (HCC) 08/25/2014   Essential (primary) hypertension 08/25/2014   HLD (hyperlipidemia) 08/25/2014    Past Surgical History:  Procedure Laterality Date   COLONOSCOPY WITH PROPOFOL N/A 12/29/2015   Procedure: COLONOSCOPY WITH PROPOFOL;  Surgeon: Midge Minium, MD;  Location: [facility];  Service: Endoscopy;  Laterality: N/A;   COLONOSCOPY WITH PROPOFOL N/A 11/29/2019   Procedure: COLONOSCOPY WITH PROPOFOL;  Surgeon: Midge Minium, MD;  Location: [facility];  Service: Endoscopy;  Laterality: N/A;  priority 4   ESOPHAGEAL DILATION     stretching   ESOPHAGEAL DILATION  01/17/2017   Procedure: ESOPHAGEAL DILATION;  Surgeon: Midge Minium, MD;  Location: [facility];  Service: Gastroenterology;;   ESOPHAGOGASTRODUODENOSCOPY (EGD) WITH PROPOFOL N/A 11/29/2016   Procedure: ESOPHAGOGASTRODUODENOSCOPY (EGD) WITH PROPOFOL;  Surgeon: Midge Minium, MD;  Location: [facility];  Service: Endoscopy;  Laterality: N/A;   ESOPHAGOGASTRODUODENOSCOPY (EGD) WITH PROPOFOL N/A 01/17/2017   Procedure: ESOPHAGOGASTRODUODENOSCOPY (EGD) WITH PROPOFOL;  Surgeon: Midge Minium, MD;  Location: [facility];  Service: Gastroenterology;  Laterality: N/A;   HERNIA REPAIR Right    INGUINAL HERNIA REPAIR Left 01/14/2020   Procedure: HERNIA REPAIR INGUINAL ADULT;  Surgeon: Earline Mayotte, MD;  Location: [facility];  Service: General;  Laterality: Left;  POLYPECTOMY N/A 12/29/2015   Procedure: POLYPECTOMY;  Surgeon: Midge Minium, MD;  Location: [facility];  Service: Endoscopy;  Laterality: N/A;   POLYPECTOMY  01/17/2017   Procedure: POLYPECTOMY;  Surgeon: Midge Minium, MD;  Location: [facility];  Service: Gastroenterology;;   POLYPECTOMY N/A 11/29/2019   Procedure: POLYPECTOMY;  Surgeon: Midge Minium, MD;  Location: [facility];  Service: Endoscopy;  Laterality: N/A;    SHOULDER SURGERY Right 09/20/2009   TOTAL HIP ARTHROPLASTY Right 12/21/2020   Procedure: TOTAL HIP ARTHROPLASTY ANTERIOR APPROACH;  Surgeon: Kennedy Bucker, MD;  Location: [facility] OR[facility];  Service: Orthopedics;  Laterality: Right;       Home Medications    Prior to Admission medications   Medication Sig Start Date End Date Taking? Authorizing Provider  amLODipine-benazepril (LOTREL) 10-20 MG capsule Take 1 capsule by mouth daily. 11/15/22  Yes Duanne Limerick, MD  atorvastatin (LIPITOR) 10 MG tablet Take 1 tablet (10 mg total) by mouth daily. TAKE 1 TABLET BY MOUTH EVERY DAY 11/15/22  Yes Duanne Limerick, MD  buPROPion (WELLBUTRIN XL) 300 MG 24 hr tablet Take 300 mg by mouth daily.  10/11/13  Yes [provider]  busPIRone (BUSPAR) 30 MG tablet Take 30 mg by mouth 2 (two) times daily.    Yes [provider]  cephALEXin (KEFLEX) 500 MG capsule Take 1 capsule (500 mg total) by mouth 4 (four) times daily for 7 days. 01/31/23 02/07/23 Yes Shirlee Latch, PA-C  cyclobenzaprine (FLEXERIL) 5 MG tablet Take 1 tablet (5 mg total) by mouth at bedtime. 11/15/22  Yes Duanne Limerick, MD  methylphenidate (RITALIN) 20 MG tablet Take 20 mg by mouth 2 (two) times daily with breakfast and lunch.    Yes [provider]  acetaminophen (TYLENOL) 500 MG tablet Take 1,000 mg by mouth every 6 (six) hours as needed for moderate pain.     [provider]    Family History Family History  Problem Relation Age of Onset   Ovarian cancer Mother    CAD Father     Social History Social History   Tobacco Use   Smoking status: Former    Current packs/day: 0.00    Average packs/day: 2.0 packs/day for 30.0 years (60.0 ttl pk-yrs)    Types: Cigarettes    Start date: 3    Quit date: 2000    Years since quitting: 24.7   Smokeless tobacco: Current    Types: Chew  Vaping Use   Vaping status: Never Used  Substance Use Topics   Alcohol use: Yes    Alcohol/week: 12.0 standard drinks of  alcohol    Types: 12 Cans of beer per week    Comment: OCC BEER   Drug use: No     Allergies   Patient has no known allergies.   Review of Systems Review of Systems  Constitutional:  Negative for fatigue and fever.  Musculoskeletal:  Positive for arthralgias and joint swelling. Negative for gait problem.  Skin:  Positive for color change. Negative for wound.  Neurological:  Negative for weakness and numbness.     Physical Exam Triage Vital Signs ED Triage Vitals  Encounter Vitals Group     BP 01/31/23 1315 127/76     Systolic BP Percentile --      Diastolic BP Percentile --      Pulse Rate 01/31/23 1315 83     Resp 01/31/23 1315 15     Temp 01/31/23 1315 98.1 F (36.7 C)  Temp Source 01/31/23 1315 Oral     SpO2 01/31/23 1315 95 %     Weight 01/31/23 1313 180 lb (81.6 kg)     Height 01/31/23 1313 5\' 3"  (1.6 m)     Head Circumference --      Peak Flow --      Pain Score 01/31/23 1313 2     Pain Loc --      Pain Education --      Exclude from Growth Chart --    No data found.  Updated Vital Signs BP 127/76 (BP Location: Left Arm)   Pulse 83   Temp 98.1 F (36.7 C) (Oral)   Resp 15   Ht 5\' 3"  (1.6 m)   Wt 180 lb (81.6 kg)   SpO2 95%   BMI 31.89 kg/m    Physical Exam Vitals and nursing note reviewed.  Constitutional:      General: He is not in acute distress.    Appearance: Normal appearance. He is well-developed. He is not ill-appearing.  HENT:     Head: Normocephalic and atraumatic.  Eyes:     General: No scleral icterus.    Conjunctiva/sclera: Conjunctivae normal.  Cardiovascular:     Rate and Rhythm: Normal rate and regular rhythm.     Pulses: Normal pulses.  Pulmonary:     Effort: Pulmonary effort is normal. No respiratory distress.     Breath sounds: Normal breath sounds.  Musculoskeletal:        General: Tenderness present.     Cervical back: Neck supple.     Comments: Right great toe: See images included in chart which show bluish  discoloration of the distal toe.  He also has erythema of the distal toe.  Tenderness of the first MTP joint and medial fold of cuticle.  No fluctuance, drainage, bleeding.  Full range of motion.  This toe has the same warmth as the other toes. Normal pulses.   Skin:    General: Skin is warm and dry.     Capillary Refill: Capillary refill takes less than 2 seconds.     Findings: Bruising and erythema present.  Neurological:     General: No focal deficit present.     Mental Status: He is alert. Mental status is at baseline.     Motor: No weakness.     Gait: Gait normal.  Psychiatric:        Mood and Affect: Mood normal.        Behavior: Behavior normal.         UC Treatments / Results  Labs (all labs ordered are listed, but only abnormal results are displayed) Labs Reviewed - No data to display  EKG   Radiology DG Toe Great Right  Result Date: 01/31/2023 CLINICAL DATA:  Purple discoloration of the distal toe with erythema and pain. EXAM: RIGHT GREAT TOE COMPARISON:  None Available. FINDINGS: Lucency with thin bony rim at the medial base of the proximal phalanx of the great toe measures 9 by 6 mm, given the thin rim this is most likely a benign geode or degenerative subcortical cyst rather than evidence of osteomyelitis or acute process. Degenerative loss of articular space in the first metatarsophalangeal articulation. No bony destructive findings. No gas in the soft tissues. No foreign body observed. No visible fracture. IMPRESSION: 1. No acute bony findings. 2. Lucency with thin bony rim at the medial base of the proximal phalanx of the great toe is most likely a benign geode  or degenerative subcortical cyst rather than evidence of osteomyelitis or acute process. 3. Degenerative loss of articular space in the first metatarsophalangeal joint. Electronically Signed   By: Gaylyn Rong M.D.   On: 01/31/2023 15:44    Procedures Procedures (including critical care  time)  Medications Ordered in UC Medications - No data to display  Initial Impression / Assessment and Plan / UC Course  I have reviewed the triage vital signs and the nursing notes.  Pertinent labs & imaging results that were available during my care of the patient were reviewed by me and considered in my medical decision making (see chart for details).   62 year old male presents for discoloration of distal right great toe for the past 1 week.  Does not recall injury. There are no wounds.  No fever.  No treatments tried.  Vitals are stable.  See images included in chart. There is erythema and bluish discoloration of the right great toe. Good pulses. Mild TTP.  X-ray of great toe obtained. X-ray shows lucency within bony rim at the medial base of the proximal phalanx of great toe, most likely a benign finding. Lower suspicion for osteomyelitis. Degeneration of 1st MTP joint.   Will cover patient for cellulitis of toe with Keflex. Placed a referral to podiatry for further evaluation especially if no significant improvement in the next few days. ED precautions discussed.  1 acute complicated injury   Final Clinical Impressions(s) / UC Diagnoses   Final diagnoses:  Cellulitis of great toe, right  Pain of right great toe  Discoloration of skin of toe     Discharge Instructions      -I will call if the x-ray is abnormal.  -Start antibiotics for possible infection -Soak foot daily in warm epsom salt x 20 min -Elevate extremity -Tylenol for pain -Follow up with podiatry when they call -Go to ER if worsening pain, swelling, fever, cold toes, streaking up foot     ED Prescriptions     Medication Sig Dispense Auth. Provider   cephALEXin (KEFLEX) 500 MG capsule Take 1 capsule (500 mg total) by mouth 4 (four) times daily for 7 days. 28 capsule Shirlee Latch, PA-C      PDMP not reviewed this encounter.   Shirlee Latch, PA-C 01/31/23 1953

## 2023-02-20 ENCOUNTER — Ambulatory Visit: Payer: BC Managed Care – PPO | Admitting: Podiatry

## 2023-02-20 ENCOUNTER — Encounter: Payer: Self-pay | Admitting: Podiatry

## 2023-02-20 VITALS — Ht 63.0 in | Wt 180.0 lb

## 2023-02-20 DIAGNOSIS — I999 Unspecified disorder of circulatory system: Secondary | ICD-10-CM | POA: Diagnosis not present

## 2023-02-20 DIAGNOSIS — I75021 Atheroembolism of right lower extremity: Secondary | ICD-10-CM | POA: Diagnosis not present

## 2023-02-20 NOTE — Progress Notes (Signed)
Subjective:  Patient ID: Alan Trevino, male    DOB: 10-11-1960,  MRN: [id number]  Chief Complaint  Patient presents with   Toe Pain     Cellulitis of great toe, right, Pain of right great toe, with discoloration of skin of toe Pinky toe      62 y.o. male presents with the above complaint.  Patient presents with complaint of right purple discoloration to the toes.  He wants to get it evaluated.  He states he came out of nowhere he does not smoke he has no history of autoimmune disease.  Causing him some pain.  He has not seen anyone else prior to seeing me.  Pain scale is 5 out of 10 dull achy in nature.   Review of Systems: Negative except as noted in the HPI. Denies N/V/F/Ch.  Past Medical History:  Diagnosis Date   Arthritis    Depression    GERD (gastroesophageal reflux disease)    H/O ulcer disease    Headache    MIGRAINES   Hypertension    SBO (small bowel obstruction) (HCC)    Wears dentures    full upper    Current Outpatient Medications:    amLODipine-benazepril (LOTREL) 10-20 MG capsule, Take 1 capsule by mouth daily., Disp: 90 capsule, Rfl: 1   buPROPion (WELLBUTRIN XL) 300 MG 24 hr tablet, Take 300 mg by mouth daily. , Disp: , Rfl:    busPIRone (BUSPAR) 30 MG tablet, Take 30 mg by mouth 2 (two) times daily. , Disp: , Rfl:    cyclobenzaprine (FLEXERIL) 5 MG tablet, Take 1 tablet (5 mg total) by mouth at bedtime., Disp: 90 tablet, Rfl: 1   methylphenidate (RITALIN) 20 MG tablet, Take 10-20 mg by mouth 3 (three) times daily with meals., Disp: , Rfl:    aspirin 81 MG chewable tablet, Chew 1 tablet (81 mg total) by mouth daily., Disp: 30 tablet, Rfl: 11   atorvastatin (LIPITOR) 40 MG tablet, Take 1 tablet (40 mg total) by mouth daily. TAKE 1 TABLET BY MOUTH EVERY DAY, Disp: 30 tablet, Rfl: 11   bisacodyl (DULCOLAX) 5 MG EC tablet, Take 2 tablets (10 mg total) by mouth at bedtime., Disp: 30 tablet, Rfl: 0   clopidogrel (PLAVIX) 75 MG tablet, Take 1 tablet (75 mg  total) by mouth daily with breakfast., Disp: 30 tablet, Rfl: 11   oxyCODONE-acetaminophen (PERCOCET) 5-325 MG tablet, Take 1 tablet by mouth every 6 (six) hours as needed for severe pain (pain score 7-10)., Disp: 40 tablet, Rfl: 0   pantoprazole (PROTONIX) 40 MG tablet, Take 1 tablet (40 mg total) by mouth 2 (two) times daily. Decrease to daily dosing after 30 days, Disp: 60 tablet, Rfl: 11   polyethylene glycol (MIRALAX / GLYCOLAX) 17 g packet, Take 17 g by mouth 2 (two) times daily., Disp: 14 each, Rfl: 0  Social History   Tobacco Use  Smoking Status Former   Current packs/day: 0.00   Average packs/day: 2.0 packs/day for 30.0 years (60.0 ttl pk-yrs)   Types: Cigarettes   Start date: 60   Quit date: 2000   Years since quitting: 24.8  Smokeless Tobacco Current   Types: Chew    Allergies  Allergen Reactions   Nsaids     Other Reaction(s): gi bleed   Objective:  There were no vitals filed for this visit. Body mass index is 31.89 kg/m. Constitutional Well developed. Well nourished.  Vascular Dorsalis pedis pulses faintly palpable bilaterally. Posterior tibial pulses faintly palpable bilaterally.  Capillary refill normal to all digits.  No cyanosis or clubbing noted. Pedal hair growth normal.  Neurologic Normal speech. Oriented to person, place, and time. Epicritic sensation to light touch grossly present bilaterally.  Dermatologic Right bluish purpleish discoloration noted to all digits from great toe to fifth toe.  No open wounds or lesion noted there is some cap refill present.  Orthopedic: Normal joint ROM without pain or crepitus bilaterally. No visible deformities. No bony tenderness.   Radiographs: None Assessment:   1. 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 Vascular abnormality    Plan:  Patient was evaluated and treated and all questions answered.  Right foot gangrene versus Raynaud's phenomena -All questions and concerns were discussed with the  patient extensive detail -I will plan on getting an ABIs PVRs to assess the flow to the right lower extremity.  Patient does not have any history of autoimmune disease. -If there is no improvement patient will benefit from rheumatology follow-up for further blood workup. -If there is a circulation issue I will have patient follow-up with the vascular physician  No follow-ups on file.  Right purple discoloration/toes does not smoke.  No history of autoimmune disease.  ABIs PVRs ordered

## 2023-02-21 ENCOUNTER — Telehealth: Payer: Self-pay | Admitting: Podiatry

## 2023-02-21 MED ORDER — OXYCODONE-ACETAMINOPHEN 5-325 MG PO TABS: 1.0000 | ORAL_TABLET | ORAL | 0 refills | Status: DC | PRN

## 2023-02-21 NOTE — Addendum Note (Signed)
Addended by: Nicholes Rough on: 02/21/2023 01:29 PM   Modules accepted: Orders

## 2023-02-21 NOTE — Telephone Encounter (Signed)
Patient came in to [facility] office today states he saw you yesterday and would like something for pain if he could. Pt uses [facility] in [place].

## 2023-02-27 ENCOUNTER — Ambulatory Visit (INDEPENDENT_AMBULATORY_CARE_PROVIDER_SITE_OTHER): Payer: BC Managed Care – PPO | Admitting: Podiatry

## 2023-02-27 ENCOUNTER — Encounter: Payer: Self-pay | Admitting: Podiatry

## 2023-02-27 ENCOUNTER — Ambulatory Visit (INDEPENDENT_AMBULATORY_CARE_PROVIDER_SITE_OTHER): Payer: BC Managed Care – PPO

## 2023-02-27 DIAGNOSIS — I75021 Atheroembolism of right lower extremity: Secondary | ICD-10-CM

## 2023-02-27 DIAGNOSIS — I999 Unspecified disorder of circulatory system: Secondary | ICD-10-CM

## 2023-02-27 LAB — VAS US ABI WITH/WO TBI
Left ABI: 1.16
Right ABI: 0.81

## 2023-02-27 MED ORDER — OXYCODONE-ACETAMINOPHEN 5-325 MG PO TABS: 1.0000 | ORAL_TABLET | ORAL | 0 refills | Status: DC | PRN

## 2023-02-27 NOTE — Progress Notes (Signed)
Subjective:  Patient ID: Alan Trevino, male    DOB: 1961-04-22,  MRN: [id number]  Chief Complaint  Patient presents with   Toe Pain    "It hurts, they did the ultrasound thing this morning.  It's moving down into my little toe too."    62 y.o. male presents with the above complaint.  Patient presents with right purple toe syndrome/vascular abnormality.  Patient states is getting slowly worse.  Patient is experiencing decreasing circulation to the right leg.  He would like to keep this as outpatient as possible.  He just got his ABIs done which shows monophasic flow to the right leg.  He states his arch with ambulation worse with pressure he is experiencing claudication type of pain.  He is a high risk of amputation   Review of Systems: Negative except as noted in the HPI. Denies N/V/F/Ch.  Past Medical History:  Diagnosis Date   Arthritis    Depression    GERD (gastroesophageal reflux disease)    H/O ulcer disease    Headache    MIGRAINES   Hypertension    SBO (small bowel obstruction) (HCC)    Wears dentures    full upper    Current Outpatient Medications:    acetaminophen (TYLENOL) 500 MG tablet, Take 1,000 mg by mouth every 6 (six) hours as needed for moderate pain. , Disp: , Rfl:    amLODipine-benazepril (LOTREL) 10-20 MG capsule, Take 1 capsule by mouth daily., Disp: 90 capsule, Rfl: 1   atorvastatin (LIPITOR) 10 MG tablet, Take 1 tablet (10 mg total) by mouth daily. TAKE 1 TABLET BY MOUTH EVERY DAY, Disp: 90 tablet, Rfl: 1   buPROPion (WELLBUTRIN XL) 300 MG 24 hr tablet, Take 300 mg by mouth daily. , Disp: , Rfl:    busPIRone (BUSPAR) 30 MG tablet, Take 30 mg by mouth 2 (two) times daily. , Disp: , Rfl:    cyclobenzaprine (FLEXERIL) 5 MG tablet, Take 1 tablet (5 mg total) by mouth at bedtime., Disp: 90 tablet, Rfl: 1   methylphenidate (RITALIN) 20 MG tablet, Take 20 mg by mouth 2 (two) times daily with breakfast and lunch. , Disp: , Rfl:    oxyCODONE-acetaminophen  (PERCOCET) 5-325 MG tablet, Take 1 tablet by mouth every 4 (four) hours as needed for severe pain (pain score 7-10). (Patient not taking: Reported on 02/27/2023), Disp: 30 tablet, Rfl: 0  Social History   Tobacco Use  Smoking Status Former   Current packs/day: 0.00   Average packs/day: 2.0 packs/day for 30.0 years (60.0 ttl pk-yrs)   Types: Cigarettes   Start date: 26   Quit date: 2000   Years since quitting: 24.8  Smokeless Tobacco Current   Types: Chew    No Known Allergies Objective:  There were no vitals filed for this visit. There is no height or weight on file to calculate BMI. Constitutional Well developed. Well nourished.  Vascular Dorsalis pedis pulses nonpalpable right side Posterior tibial pulses not the right side Capillary refill n sluggish to fourth and fifth digit and hallux .  Some cyanosis noted. Pedal hair no growth  Neurologic Normal speech. Oriented to person, place, and time. Epicritic sensation to light touch grossly present bilaterally.  Dermatologic Nails well groomed and normal in appearance. No open wounds. No skin lesions.  Orthopedic: Manual muscle strength 5 out of 5   Radiographs: None Assessment:   1. 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 Vascular abnormality    Plan:  Patient was evaluated and treated and all questions answered.  Right purple toe syndrome/peripheral vascular disease -All questions and concerns were discussed with the patient in extensive detail -I placed an urgent vein and vascular consult to be evaluated for intervention.  For now patient would like to keep this as an outpatient however I did discuss with him if it continues to get worse to go to the emergency room to be admitted and be seen by a vascular physician.  He states understanding -Pain medication was dispensed -He is a high risk of amputation if the vascular flow is now restored. -Weightbearing as tolerated  No follow-ups on file.

## 2023-02-28 ENCOUNTER — Encounter: Payer: Self-pay | Admitting: Certified Registered"

## 2023-02-28 ENCOUNTER — Other Ambulatory Visit: Payer: Self-pay

## 2023-02-28 ENCOUNTER — Encounter
Admission: EM | Disposition: A | Discharge status: Home / Self Care | Payer: Self-pay | Source: Home / Self Care | Attending: Student

## 2023-02-28 ENCOUNTER — Emergency Department: Payer: BC Managed Care – PPO

## 2023-02-28 ENCOUNTER — Inpatient Hospital Stay
Admission: EM | Admit: 2023-02-28 | Discharge: 2023-03-03 | DRG: 254 | Disposition: A | Discharge status: Home / Self Care | Payer: BC Managed Care – PPO | Attending: Student | Admitting: Student

## 2023-02-28 DIAGNOSIS — T39395S Adverse effect of other nonsteroidal anti-inflammatory drugs [NSAID], sequela: Secondary | ICD-10-CM

## 2023-02-28 DIAGNOSIS — Z96641 Presence of right artificial hip joint: Secondary | ICD-10-CM | POA: Diagnosis present

## 2023-02-28 DIAGNOSIS — Z8249 Family history of ischemic heart disease and other diseases of the circulatory system: Secondary | ICD-10-CM

## 2023-02-28 DIAGNOSIS — Z7902 Long term (current) use of antithrombotics/antiplatelets: Secondary | ICD-10-CM

## 2023-02-28 DIAGNOSIS — Z79891 Long term (current) use of opiate analgesic: Secondary | ICD-10-CM | POA: Diagnosis not present

## 2023-02-28 DIAGNOSIS — K219 Gastro-esophageal reflux disease without esophagitis: Secondary | ICD-10-CM | POA: Diagnosis present

## 2023-02-28 DIAGNOSIS — E785 Hyperlipidemia, unspecified: Secondary | ICD-10-CM | POA: Diagnosis present

## 2023-02-28 DIAGNOSIS — I70221 Atherosclerosis of native arteries of extremities with rest pain, right leg: Secondary | ICD-10-CM | POA: Diagnosis not present

## 2023-02-28 DIAGNOSIS — I75021 Atheroembolism of right lower extremity: Secondary | ICD-10-CM | POA: Diagnosis present

## 2023-02-28 DIAGNOSIS — I700 Atherosclerosis of aorta: Secondary | ICD-10-CM

## 2023-02-28 DIAGNOSIS — F32A Depression, unspecified: Secondary | ICD-10-CM | POA: Diagnosis present

## 2023-02-28 DIAGNOSIS — Z79899 Other long term (current) drug therapy: Secondary | ICD-10-CM | POA: Diagnosis not present

## 2023-02-28 DIAGNOSIS — G43909 Migraine, unspecified, not intractable, without status migrainosus: Secondary | ICD-10-CM | POA: Diagnosis present

## 2023-02-28 DIAGNOSIS — Z886 Allergy status to analgesic agent status: Secondary | ICD-10-CM

## 2023-02-28 DIAGNOSIS — Z8041 Family history of malignant neoplasm of ovary: Secondary | ICD-10-CM

## 2023-02-28 DIAGNOSIS — I998 Other disorder of circulatory system: Secondary | ICD-10-CM

## 2023-02-28 DIAGNOSIS — E782 Mixed hyperlipidemia: Secondary | ICD-10-CM

## 2023-02-28 DIAGNOSIS — Z87891 Personal history of nicotine dependence: Secondary | ICD-10-CM

## 2023-02-28 DIAGNOSIS — I70229 Atherosclerosis of native arteries of extremities with rest pain, unspecified extremity: Secondary | ICD-10-CM | POA: Diagnosis present

## 2023-02-28 DIAGNOSIS — Z8719 Personal history of other diseases of the digestive system: Secondary | ICD-10-CM

## 2023-02-28 DIAGNOSIS — I10 Essential (primary) hypertension: Secondary | ICD-10-CM | POA: Diagnosis present

## 2023-02-28 DIAGNOSIS — K922 Gastrointestinal hemorrhage, unspecified: Secondary | ICD-10-CM | POA: Diagnosis present

## 2023-02-28 HISTORY — DX: Atherosclerosis of native arteries of extremities with rest pain, right leg: I70.221

## 2023-02-28 HISTORY — DX: Other disorder of circulatory system: I99.8

## 2023-02-28 HISTORY — PX: LOWER EXTREMITY ANGIOGRAPHY: CATH118251

## 2023-02-28 LAB — BASIC METABOLIC PANEL
Anion gap: 11 (ref 5–15)
BUN: 13 mg/dL (ref 8–23)
CO2: 23 mmol/L (ref 22–32)
Calcium: 8.9 mg/dL (ref 8.9–10.3)
Chloride: 101 mmol/L (ref 98–111)
Creatinine, Ser: 1.22 mg/dL (ref 0.61–1.24)
GFR, Estimated: >60 mL/min (ref >60)
Glucose, Bld: 160 mg/dL — ABNORMAL HIGH (ref 70–99)
Potassium: 3.7 mmol/L (ref 3.5–5.1)
Sodium: 135 mmol/L (ref 135–145)

## 2023-02-28 LAB — CBC
HCT: 43.7 % (ref 39.0–52.0)
Hemoglobin: 14.8 g/dL (ref 13.0–17.0)
MCH: 32.7 pg (ref 26.0–34.0)
MCHC: 33.9 g/dL (ref 30.0–36.0)
MCV: 96.5 fL (ref 80.0–100.0)
Platelets: 197 10*3/uL (ref 150–400)
RBC: 4.53 MIL/uL (ref 4.22–5.81)
RDW: 12 % (ref 11.5–15.5)
WBC: 9.1 10*3/uL (ref 4.0–10.5)
nRBC: 0 % (ref 0.0–0.2)

## 2023-02-28 LAB — APTT: aPTT: >200 s (ref 24–36)

## 2023-02-28 LAB — PROTIME-INR
INR: 1.1 (ref 0.8–1.2)
Prothrombin Time: 14.3 s (ref 11.4–15.2)

## 2023-02-28 SURGERY — LOWER EXTREMITY ANGIOGRAPHY
Anesthesia: Moderate Sedation | Laterality: Right

## 2023-02-28 MED ORDER — HEPARIN (PORCINE) 25000 UT/250ML-% IV SOLN: 1250.0000 [IU]/h | INTRAVENOUS | Status: DC

## 2023-02-28 MED ORDER — HYDROMORPHONE HCL 1 MG/ML IJ SOLN: 1.0000 mg | Freq: Once | INTRAMUSCULAR | Status: DC | PRN

## 2023-02-28 MED ORDER — METHYLPHENIDATE HCL 10 MG PO TABS: 10.0000 mg | ORAL_TABLET | Freq: Every day | ORAL | Status: DC | PRN

## 2023-02-28 MED ORDER — MIDAZOLAM HCL 2 MG/ML PO SYRP: 8.0000 mg | ORAL_SOLUTION | Freq: Once | ORAL | Status: DC | PRN

## 2023-02-28 MED ORDER — CLOPIDOGREL BISULFATE 75 MG PO TABS
75.0000 mg | ORAL_TABLET | Freq: Every day | ORAL | Status: DC
Start: 2023-03-01 — End: 2023-03-03
  Administered 2023-03-01 – 2023-03-03 (×3): 75 mg via ORAL
  Filled 2023-02-28 (×3): qty 1

## 2023-02-28 MED ORDER — DIPHENHYDRAMINE HCL 50 MG/ML IJ SOLN: 50.0000 mg | Freq: Once | INTRAMUSCULAR | Status: DC | PRN

## 2023-02-28 MED ORDER — BUPROPION HCL ER (XL) 150 MG PO TB24
300.0000 mg | ORAL_TABLET | Freq: Every day | ORAL | Status: DC
  Administered 2023-03-01 – 2023-03-03 (×3): 300 mg via ORAL
  Filled 2023-02-28 (×3): qty 2
  Filled 2023-02-28: qty 1

## 2023-02-28 MED ORDER — MIDAZOLAM HCL 5 MG/5ML IJ SOLN
INTRAMUSCULAR | Status: AC
  Filled 2023-02-28: qty 5

## 2023-02-28 MED ORDER — ACETAMINOPHEN 325 MG PO TABS
650.0000 mg | ORAL_TABLET | Freq: Four times a day (QID) | ORAL | Status: DC | PRN
  Administered 2023-02-28: 650 mg via ORAL
  Filled 2023-02-28 (×2): qty 2

## 2023-02-28 MED ORDER — BUSPIRONE HCL 10 MG PO TABS
30.0000 mg | ORAL_TABLET | Freq: Two times a day (BID) | ORAL | Status: DC
Start: 2023-02-28 — End: 2023-03-03
  Administered 2023-02-28 – 2023-03-03 (×6): 30 mg via ORAL
  Filled 2023-02-28 (×3): qty 3
  Filled 2023-02-28: qty 2
  Filled 2023-02-28 (×3): qty 3

## 2023-02-28 MED ORDER — DIPHENHYDRAMINE HCL 50 MG/ML IJ SOLN
INTRAMUSCULAR | Status: AC
  Filled 2023-02-28: qty 1

## 2023-02-28 MED ORDER — DIPHENHYDRAMINE HCL 50 MG/ML IJ SOLN
INTRAMUSCULAR | Status: DC | PRN
  Administered 2023-02-28: 25 mg via INTRAVENOUS

## 2023-02-28 MED ORDER — FENTANYL CITRATE PF 50 MCG/ML IJ SOSY: 12.5000 ug | PREFILLED_SYRINGE | Freq: Once | INTRAMUSCULAR | Status: DC | PRN

## 2023-02-28 MED ORDER — CEFAZOLIN SODIUM-DEXTROSE 2-4 GM/100ML-% IV SOLN
2.0000 g | INTRAVENOUS | Status: DC
  Filled 2023-02-28: qty 100

## 2023-02-28 MED ORDER — HEPARIN BOLUS VIA INFUSION
5200.0000 [IU] | Freq: Once | INTRAVENOUS | Status: AC
Start: 2023-02-28 — End: 2023-02-28
  Administered 2023-02-28: 5200 [IU] via INTRAVENOUS
  Filled 2023-02-28: qty 5200

## 2023-02-28 MED ORDER — FENTANYL CITRATE (PF) 100 MCG/2ML IJ SOLN
INTRAMUSCULAR | Status: AC
  Filled 2023-02-28: qty 4

## 2023-02-28 MED ORDER — LIDOCAINE HCL (PF) 1 % IJ SOLN
INTRAMUSCULAR | Status: DC | PRN
  Administered 2023-02-28: 10 mL

## 2023-02-28 MED ORDER — MIDAZOLAM HCL 2 MG/2ML IJ SOLN
INTRAMUSCULAR | Status: DC | PRN
  Administered 2023-02-28: 2 mg via INTRAVENOUS
  Administered 2023-02-28 (×2): .5 mg via INTRAVENOUS

## 2023-02-28 MED ORDER — MORPHINE SULFATE (PF) 4 MG/ML IV SOLN
4.0000 mg | INTRAVENOUS | Status: DC | PRN
  Administered 2023-02-28 – 2023-03-03 (×9): 4 mg via INTRAVENOUS
  Filled 2023-02-28 (×11): qty 1

## 2023-02-28 MED ORDER — FAMOTIDINE 20 MG PO TABS: 40.0000 mg | ORAL_TABLET | Freq: Once | ORAL | Status: DC | PRN

## 2023-02-28 MED ORDER — SODIUM CHLORIDE 0.9 % IV SOLN: INTRAVENOUS | Status: DC

## 2023-02-28 MED ORDER — METHYLPHENIDATE HCL 10 MG PO TABS
10.0000 mg | ORAL_TABLET | Freq: Three times a day (TID) | ORAL | Status: DC
Start: 2023-02-28 — End: 2023-02-28

## 2023-02-28 MED ORDER — ONDANSETRON HCL 4 MG/2ML IJ SOLN
4.0000 mg | Freq: Four times a day (QID) | INTRAMUSCULAR | Status: DC | PRN
  Administered 2023-03-02: 4 mg via INTRAVENOUS
  Filled 2023-02-28: qty 2

## 2023-02-28 MED ORDER — HEPARIN SODIUM (PORCINE) 1000 UNIT/ML IJ SOLN
INTRAMUSCULAR | Status: AC
Start: 2023-02-28 — End: ?
  Filled 2023-02-28: qty 10

## 2023-02-28 MED ORDER — PANTOPRAZOLE SODIUM 40 MG IV SOLR
40.0000 mg | Freq: Two times a day (BID) | INTRAVENOUS | Status: DC
  Administered 2023-02-28 – 2023-03-02 (×4): 40 mg via INTRAVENOUS
  Filled 2023-02-28 (×5): qty 10

## 2023-02-28 MED ORDER — ATORVASTATIN CALCIUM 10 MG PO TABS
10.0000 mg | ORAL_TABLET | Freq: Every day | ORAL | Status: DC
  Administered 2023-03-01 – 2023-03-02 (×2): 10 mg via ORAL
  Filled 2023-02-28 (×3): qty 1

## 2023-02-28 MED ORDER — SODIUM CHLORIDE 0.9 % IV SOLN: INTRAVENOUS | Status: AC

## 2023-02-28 MED ORDER — ONDANSETRON HCL 4 MG/2ML IJ SOLN
4.0000 mg | Freq: Once | INTRAMUSCULAR | Status: AC
  Administered 2023-02-28: 4 mg via INTRAVENOUS
  Filled 2023-02-28: qty 2

## 2023-02-28 MED ORDER — SODIUM CHLORIDE 0.9% FLUSH
3.0000 mL | Freq: Two times a day (BID) | INTRAVENOUS | Status: DC
  Administered 2023-02-28 – 2023-03-02 (×4): 3 mL via INTRAVENOUS

## 2023-02-28 MED ORDER — CEFAZOLIN SODIUM-DEXTROSE 2-4 GM/100ML-% IV SOLN
INTRAVENOUS | Status: AC
  Filled 2023-02-28: qty 100

## 2023-02-28 MED ORDER — HYDROMORPHONE HCL 1 MG/ML IJ SOLN: 0.5000 mg | INTRAMUSCULAR | Status: DC | PRN

## 2023-02-28 MED ORDER — SODIUM CHLORIDE 0.9% FLUSH
3.0000 mL | INTRAVENOUS | Status: DC | PRN
Start: 2023-02-28 — End: 2023-03-03

## 2023-02-28 MED ORDER — HEPARIN SODIUM (PORCINE) 1000 UNIT/ML IJ SOLN
INTRAMUSCULAR | Status: DC | PRN
  Administered 2023-02-28: 5000 [IU] via INTRAVENOUS

## 2023-02-28 MED ORDER — METHYLPHENIDATE HCL 20 MG PO TABS
20.0000 mg | ORAL_TABLET | Freq: Two times a day (BID) | ORAL | Status: DC
  Administered 2023-03-01 – 2023-03-03 (×5): 20 mg via ORAL
  Filled 2023-02-28: qty 1
  Filled 2023-02-28: qty 2
  Filled 2023-02-28: qty 1
  Filled 2023-02-28 (×4): qty 2

## 2023-02-28 MED ORDER — HEPARIN (PORCINE) 25000 UT/250ML-% IV SOLN
1250.0000 [IU]/h | INTRAVENOUS | Status: DC
  Administered 2023-02-28 – 2023-03-02 (×3): 1250 [IU]/h via INTRAVENOUS
  Filled 2023-02-28 (×2): qty 250

## 2023-02-28 MED ORDER — ONDANSETRON HCL 4 MG PO TABS: 4.0000 mg | ORAL_TABLET | Freq: Four times a day (QID) | ORAL | Status: DC | PRN

## 2023-02-28 MED ORDER — FENTANYL CITRATE (PF) 100 MCG/2ML IJ SOLN
INTRAMUSCULAR | Status: DC | PRN
  Administered 2023-02-28: 25 ug via INTRAVENOUS
  Administered 2023-02-28: 50 ug via INTRAVENOUS
  Administered 2023-02-28: 12.5 ug via INTRAVENOUS

## 2023-02-28 MED ORDER — AMLODIPINE BESYLATE 10 MG PO TABS
10.0000 mg | ORAL_TABLET | Freq: Every day | ORAL | Status: DC
  Administered 2023-03-01 – 2023-03-03 (×3): 10 mg via ORAL
  Filled 2023-02-28 (×3): qty 1

## 2023-02-28 MED ORDER — SODIUM CHLORIDE 0.9 % IV SOLN: 250.0000 mL | INTRAVENOUS | Status: AC | PRN

## 2023-02-28 MED ORDER — METHYLPREDNISOLONE SODIUM SUCC 125 MG IJ SOLR: 125.0000 mg | Freq: Once | INTRAMUSCULAR | Status: DC | PRN

## 2023-02-28 MED ORDER — BENAZEPRIL HCL 20 MG PO TABS
20.0000 mg | ORAL_TABLET | Freq: Every day | ORAL | Status: DC
  Administered 2023-03-01 – 2023-03-03 (×3): 20 mg via ORAL
  Filled 2023-02-28 (×4): qty 1

## 2023-02-28 MED ORDER — IODIXANOL 320 MG/ML IV SOLN
INTRAVENOUS | Status: DC | PRN
  Administered 2023-02-28: 50 mL

## 2023-02-28 MED ORDER — HEPARIN (PORCINE) IN NACL 1000-0.9 UT/500ML-% IV SOLN
INTRAVENOUS | Status: DC | PRN
  Administered 2023-02-28: 1000 mL

## 2023-02-28 MED ORDER — HEPARIN (PORCINE) 25000 UT/250ML-% IV SOLN
1250.0000 [IU]/h | INTRAVENOUS | Status: DC
  Administered 2023-02-28: 1250 [IU]/h via INTRAVENOUS
  Filled 2023-02-28: qty 250

## 2023-02-28 SURGICAL SUPPLY — 22 items
BALLN LUTONIX DCB 6X40X130 (BALLOONS) ×1
BALLN LUTONIX DCB 6X60X130 (BALLOONS) ×1
BALLOON LUTONIX DCB 6X40X130 (BALLOONS) IMPLANT
BALLOON LUTONIX DCB 6X60X130 (BALLOONS) IMPLANT
CATH ANGIO 5F PIGTAIL 65CM (CATHETERS) IMPLANT
CATH VERT 5X100 (CATHETERS) IMPLANT
COVER PROBE ULTRASOUND 5X96 (MISCELLANEOUS) IMPLANT
DEVICE PRESTO INFLATION (MISCELLANEOUS) IMPLANT
DEVICE STARCLOSE SE CLOSURE (Vascular Products) IMPLANT
GLIDEWIRE ADV .035X260CM (WIRE) IMPLANT
GOWN STRL REUS W/ TWL LRG LVL3 (GOWN DISPOSABLE) ×1 IMPLANT
GOWN STRL REUS W/TWL LRG LVL3 (GOWN DISPOSABLE) ×1
NDL ENTRY 21GA 7CM ECHOTIP (NEEDLE) IMPLANT
NEEDLE ENTRY 21GA 7CM ECHOTIP (NEEDLE) ×1 IMPLANT
PACK ANGIOGRAPHY (CUSTOM PROCEDURE TRAY) ×1 IMPLANT
SET INTRO CAPELLA COAXIAL (SET/KITS/TRAYS/PACK) IMPLANT
SHEATH ANL2 6FRX45 HC (SHEATH) IMPLANT
SHEATH BRITE TIP 5FRX11 (SHEATH) IMPLANT
STENT LIFESTENT 5F 7X60X135 (Permanent Stent) IMPLANT
SYR MEDRAD MARK 7 150ML (SYRINGE) IMPLANT
TUBING CONTRAST HIGH PRESS 72 (TUBING) IMPLANT
WIRE GUIDERIGHT .035X150 (WIRE) IMPLANT

## 2023-02-28 NOTE — Consult Note (Signed)
Pharmacy Consult Note - Anticoagulation  Pharmacy Consult for heparin Indication:  limb ischemia  PATIENT MEASUREMENTS: Height: 5\' 3"  (160 cm) Weight: 81.6 kg (179 lb 14.3 oz) IBW/kg (Calculated) : 56.9 HEPARIN DW (KG): 74.3  VITAL SIGNS: Temp: 98 F (36.7 C) (11/08 1610) Temp Source: Oral (11/08 1610) BP: 164/93 (11/08 1800) Pulse Rate: 68 (11/08 1800)  Recent Labs    02/28/23 1300 02/28/23 1430  HGB 14.8  --   HCT 43.7  --   PLT 197  --   APTT  --  >200*  LABPROT  --  14.3  INR  --  1.1  CREATININE 1.22  --     Estimated Creatinine Clearance: 58.6 mL/min (by C-G formula based on SCr of 1.22 mg/dL).  PAST MEDICAL HISTORY: Past Medical History:  Diagnosis Date   Arthritis    Depression    GERD (gastroesophageal reflux disease)    H/O ulcer disease    Headache    MIGRAINES   Hypertension    SBO (small bowel obstruction) (HCC)    Wears dentures    full upper    ASSESSMENT: 62 y.o. male with PMH including PVD is presenting with limb ischemia. Recent ABIs shows monophasic flow to right leg. Pt at high risk of amputation unless blood flow can be restored. Patient is not on chronic anticoagulation per chart review. Pharmacy has been consulted to initiate and manage heparin intravenous infusion.  Pertinent medications: No chronic anticoagulation PTA per chart review  Goal(s) of therapy: Heparin level 0.3 - 0.7 units/mL Monitor platelets by anticoagulation protocol: Yes   Baseline anticoagulation labs: Recent Labs    02/28/23 1300 02/28/23 1430  APTT  --  >200*  INR  --  1.1  HGB 14.8  --   PLT 197  --     Date Time aPTT/HL Rate/Comment    PLAN: Will restart heparin infusion at 1930 at previous rate of 1250 units/hour (no bolus) Check heparin level in 6 hours, then daily once at least two levels are consecutively therapeutic. Monitor CBC daily while on heparin infusion.   Paulita Fujita, PharmD Clinical Pharmacist 02/28/2023 6:28 PM

## 2023-02-28 NOTE — Assessment & Plan Note (Signed)
Continue statin. 

## 2023-02-28 NOTE — Plan of Care (Signed)
  Problem: Education: Goal: Knowledge of General Education information will improve Description Including pain rating scale, medication(s)/side effects and non-pharmacologic comfort measures Outcome: Progressing   Problem: Health Behavior/Discharge Planning: Goal: Ability to manage health-related needs will improve Outcome: Progressing   

## 2023-02-28 NOTE — Assessment & Plan Note (Signed)
Noted prior history of NSAID induced GI bleeding IV PPI in the setting of heparin drip Hemoglobin 14.8 today Trend Monitor for any black stools with downtrending hemoglobin Follow

## 2023-02-28 NOTE — ED Notes (Signed)
Patient walked to restroom with walker/ Jose[employer] Class assist. \

## 2023-02-28 NOTE — Progress Notes (Signed)
Received call from Lab with critical PTT value of >200 and sent secure chat to Dr. Gilda Crease and called Vascular Lab and updated Alan Trevino with this result.

## 2023-02-28 NOTE — Op Note (Signed)
[facility]  Percutaneous Study/Intervention Procedural Note   Date of Surgery: 02/28/2023  Surgeon:  Renford Dills, MD.  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  Introduction catheter into right lower extremity 3rd order catheter placement              2.  Contrast injection right lower extremity for distal runoff             3.   Percutaneous transluminal angioplasty and stent placement right superficial femoral artery              4.   Star close closure left common femoral arteriotomy  Anesthesia: Conscious sedation was administered by the radiology RN under my direct supervision. IV Versed plus fentanyl were utilized. Continuous ECG, pulse oximetry and blood pressure was monitored throughout the entire procedure. Conscious sedation was for a total of 32 minutes.  Sheath: 6 [place] Ansell left common femoral retrograde  Contrast: 50 cc  Fluoroscopy Time: 5.5 minutes  Indications:  Alan Trevino presents with ischemia of his toes on the right foot.  This happened abruptly and because of the severe pain the patient came to the emergency room.  Examination demonstrated nonpalpable pulses.  Angiography with hope for intervention for salvage of his forefoot was recommended.  The risks and benefits are reviewed all questions answered patient agrees to proceed.  Procedure:  Alan Trevino is a 62 y.o. y.o. male who was identified and appropriate procedural time out was performed.  The patient was then placed supine on the table and prepped and draped in the usual sterile fashion.    Ultrasound was placed in the sterile sleeve and the left groin was evaluated the left common femoral artery was echolucent and pulsatile indicating patency.  Image was recorded for the permanent record and under real-time  visualization a microneedle was inserted into the common femoral artery microwire followed by a micro-sheath.  A J-wire was then advanced through the micro-sheath and a  5 [place] sheath was then inserted over a J-wire. J-wire was then advanced and a 5 French pigtail catheter was positioned at the level of T12. AP projection of the aorta was then obtained. Pigtail catheter was repositioned to above the bifurcation and a LAO view of the pelvis was obtained.  Subsequently a pigtail catheter with the stiff angle Glidewire was used to cross the aortic bifurcation the catheter wire were advanced down into the right distal external iliac artery. Oblique view of the femoral bifurcation was then obtained and subsequently the wire was reintroduced and the pigtail catheter negotiated into the SFA representing third order catheter placement. Distal runoff was then performed.  6000 units of heparin was then given and allowed to circulate and a 6 [place] Ansell sheath was advanced up and over the bifurcation and positioned in the proximal superficial femoral artery  Using a Kumpe catheter and an advantage Glidewire the subtotal occlusion in the mid SFA was negotiated.  Hand-injection of contrast was used to verify distal runoff and demonstrated two-vessel runoff via the anterior tibial and posterior tibial with filling of the pedal arch.  The wire was reintroduced and a 7 mm x 60 mm life stent was advanced across the lesion.  It was deployed without difficulty.  A 6 mm x 60 mm Lutonix drug-eluting balloon  was then used to post dilate the stent.  A second 6 mm x 40 mm Lutonix was also used to treat slightly more distally overlapping with the stent.  Both inflations were to 8 atm for approximately 1 minute.  Follow-up imaging demonstrated wide patency of the SFA and popliteal with less than 10% residual stenosis.  Distal runoff was preserved.  After review of these images the sheath is pulled into the left external iliac  oblique of the common femoral is obtained and a Star close device deployed. There no immediate Complications.  Findings:  The abdominal aorta is opacified with a bolus injection contrast. Renal arteries are single and patent. The aorta itself has diffuse disease but no hemodynamically significant lesions. The common and external iliac arteries are widely patent bilaterally.  The right common femoral is widely patent as is the profunda femoris.  The SFA does indeed have a significant stenosis in its midportion this is a lesion that is greater than 90%.  The popliteal demonstrates diffuse disease but there are no hemodynamically significant stenoses.  The trifurcation is widely patent.  Anterior tibial and posterior tibial are widely patent and fill the pedal arch.  The peroneal is patent but appears to occlude near the ankle.    Following angioplasty and stent placement in the SFA there is less than 10% residual stenosis.                           Disposition: Patient was taken to the recovery room in stable condition having tolerated the procedure well.  Alan Trevino 02/28/2023,5:57 PM

## 2023-02-28 NOTE — ED Triage Notes (Signed)
Pt to ED via POV from home. Pt reports circulation issues to right big toe and digits 4 and 5. Pt toes purple/black. Pt was seen by podiatry yesterday by Dr. Allena Katz. Plan was outpatient care but was unable to be seen for 7-10days and referred to be seen in ER. DP pulse and tibial pulse faintly palpable in triage and marked.

## 2023-02-28 NOTE — Consult Note (Signed)
Pharmacy Consult Note - Anticoagulation  Pharmacy Consult for heparin Indication:  limb ischemia  PATIENT MEASUREMENTS: Height: 5\' 3"  (160 cm) Weight: 81.6 kg (179 lb 14.3 oz) IBW/kg (Calculated) : 56.9 HEPARIN DW (KG): 74.3  VITAL SIGNS: Temp: 98.1 F (36.7 C) (11/08 1250) Temp Source: Oral (11/08 1250) BP: 131/67 (11/08 1250) Pulse Rate: 78 (11/08 1250)  Recent Labs    02/28/23 1300  HGB 14.8  HCT 43.7  PLT 197  CREATININE 1.22    Estimated Creatinine Clearance: 58.6 mL/min (by C-G formula based on SCr of 1.22 mg/dL).  PAST MEDICAL HISTORY: Past Medical History:  Diagnosis Date   Arthritis    Depression    GERD (gastroesophageal reflux disease)    H/O ulcer disease    Headache    MIGRAINES   Hypertension    SBO (small bowel obstruction) (HCC)    Wears dentures    full upper    ASSESSMENT: 62 y.o. male with PMH including PVD is presenting with limb ischemia. Recent ABIs shows monophasic flow to right leg. Pt at high risk of amputation unless blood flow can be restored. Patient is not on chronic anticoagulation per chart review. Pharmacy has been consulted to initiate and manage heparin intravenous infusion.  Pertinent medications: No chronic anticoagulation PTA per chart review  Goal(s) of therapy: Heparin level 0.3 - 0.7 units/mL Monitor platelets by anticoagulation protocol: Yes   Baseline anticoagulation labs: Recent Labs    02/28/23 1300  HGB 14.8  PLT 197    Date Time aPTT/HL Rate/Comment    PLAN: Give 5200 units bolus x1; then start heparin infusion at 1250 units/hour. Check heparin level in 6 hours, then daily once at least two levels are consecutively therapeutic. Monitor CBC daily while on heparin infusion.    Will M. Dareen Piano, PharmD Clinical Pharmacist 02/28/2023 2:09 PM

## 2023-02-28 NOTE — H&P (View-Only) (Signed)
Hospital Consult    Reason for Consult:  Right Lower Extremity Claudication Requesting Physician:  Dr Willy Eddy MD  MRN #:  [id number]  History of Present Illness: This is a 62 y.o. male presents to the ER for evaluation of worsening pain with ambulation of his right leg particular his right great toe and the arch of his foot. Symptoms have been worsening over the past few weeks. Over the past day or so though he started having increasing bluish discoloration of the toe. Denies any specific injury. Does not currently smoke. Not on any anticoagulation or blood thinners.   On exam this afternoon patient is resting comfortably in the emergency room.  Patient endorses that the symptoms started about 4 weeks ago when he was gardening.  He endorses feeling something in his leg and his toes got swollen.  He then went and bought a larger pair shoes thinking that it was just a blister but they progressively got worse.  Over the last 3 to 4 days he has now had bad rest pain to the ball of his foot and his toes extending into the arch of his foot.  He endorses 10 out of 10 pain when he tries to walk on it.  Vascular surgery consulted to evaluate.  Past Medical History:  Diagnosis Date   Arthritis    Depression    GERD (gastroesophageal reflux disease)    H/O ulcer disease    Headache    MIGRAINES   Hypertension    SBO (small bowel obstruction) (HCC)    Wears dentures    full upper    Past Surgical History:  Procedure Laterality Date   COLONOSCOPY WITH PROPOFOL N/A 12/29/2015   Procedure: COLONOSCOPY WITH PROPOFOL;  Surgeon: Midge Minium, MD;  Location: [facility];  Service: Endoscopy;  Laterality: N/A;   COLONOSCOPY WITH PROPOFOL N/A 11/29/2019   Procedure: COLONOSCOPY WITH PROPOFOL;  Surgeon: Midge Minium, MD;  Location: [facility];  Service: Endoscopy;  Laterality: N/A;  priority 4   ESOPHAGEAL DILATION     stretching   ESOPHAGEAL DILATION  01/17/2017   Procedure:  ESOPHAGEAL DILATION;  Surgeon: Midge Minium, MD;  Location: [facility];  Service: Gastroenterology;;   ESOPHAGOGASTRODUODENOSCOPY (EGD) WITH PROPOFOL N/A 11/29/2016   Procedure: ESOPHAGOGASTRODUODENOSCOPY (EGD) WITH PROPOFOL;  Surgeon: Midge Minium, MD;  Location: [facility] ENDOSCOPY;  Service: Endoscopy;  Laterality: N/A;   ESOPHAGOGASTRODUODENOSCOPY (EGD) WITH PROPOFOL N/A 01/17/2017   Procedure: ESOPHAGOGASTRODUODENOSCOPY (EGD) WITH PROPOFOL;  Surgeon: Midge Minium, MD;  Location: [facility];  Service: Gastroenterology;  Laterality: N/A;   HERNIA REPAIR Right    INGUINAL HERNIA REPAIR Left 01/14/2020   Procedure: HERNIA REPAIR INGUINAL ADULT;  Surgeon: Earline Mayotte, MD;  Location: [facility] ORS;  Service: General;  Laterality: Left;   POLYPECTOMY N/A 12/29/2015   Procedure: POLYPECTOMY;  Surgeon: Midge Minium, MD;  Location: [facility];  Service: Endoscopy;  Laterality: N/A;   POLYPECTOMY  01/17/2017   Procedure: POLYPECTOMY;  Surgeon: Midge Minium, MD;  Location: [facility];  Service: Gastroenterology;;   POLYPECTOMY N/A 11/29/2019   Procedure: POLYPECTOMY;  Surgeon: Midge Minium, MD;  Location: [facility];  Service: Endoscopy;  Laterality: N/A;   SHOULDER SURGERY Right 09/20/2009   TOTAL HIP ARTHROPLASTY Right 12/21/2020   Procedure: TOTAL HIP ARTHROPLASTY ANTERIOR APPROACH;  Surgeon: Kennedy Bucker, MD;  Location: [facility] ORS;  Service: Orthopedics;  Laterality: Right;    No Known Allergies  Prior to Admission medications   Medication Sig Start  Date End Date Taking? Authorizing Provider  acetaminophen (TYLENOL) 500 MG tablet Take 1,000 mg by mouth every 6 (six) hours as needed for moderate pain.     [provider]  amLODipine-benazepril (LOTREL) 10-20 MG capsule Take 1 capsule by mouth daily. 11/15/22   Duanne Limerick, MD  atorvastatin (LIPITOR) 10 MG tablet Take 1 tablet (10 mg total) by mouth daily. TAKE 1 TABLET BY MOUTH EVERY DAY 11/15/22    Duanne Limerick, MD  buPROPion (WELLBUTRIN XL) 300 MG 24 hr tablet Take 300 mg by mouth daily.  10/11/13   [provider]  busPIRone (BUSPAR) 30 MG tablet Take 30 mg by mouth 2 (two) times daily.     [provider]  cyclobenzaprine (FLEXERIL) 5 MG tablet Take 1 tablet (5 mg total) by mouth at bedtime. 11/15/22   Duanne Limerick, MD  methylphenidate (RITALIN) 20 MG tablet Take 20 mg by mouth 2 (two) times daily with breakfast and lunch.     [provider]  oxyCODONE-acetaminophen (PERCOCET) 5-325 MG tablet Take 1 tablet by mouth every 4 (four) hours as needed for severe pain (pain score 7-10). Patient not taking: Reported on 02/27/2023 02/21/23   Candelaria Stagers, DPM  oxyCODONE-acetaminophen (PERCOCET) 5-325 MG tablet Take 1 tablet by mouth every 4 (four) hours as needed for severe pain (pain score 7-10). 02/27/23   Candelaria Stagers, DPM    Social History   Socioeconomic History   Marital status: Divorced    Spouse name: Not on file   Number of children: Not on file   Years of education: Not on file   Highest education level: Not on file  Occupational History   Not on file  Tobacco Use   Smoking status: Former    Current packs/day: 0.00    Average packs/day: 2.0 packs/day for 30.0 years (60.0 ttl pk-yrs)    Types: Cigarettes    Start date: 39    Quit date: 2000    Years since quitting: 24.8   Smokeless tobacco: Current    Types: Chew  Vaping Use   Vaping status: Never Used  Substance and Sexual Activity   Alcohol use: Yes    Alcohol/week: 12.0 standard drinks of alcohol    Types: 12 Cans of beer per week    Comment: BEER once a week   Drug use: No   Sexual activity: Not Currently  Other Topics Concern   Not on file  Social History Narrative   Not on file   Social Determinants of Health   Financial Resource Strain: Not on file  Food Insecurity: Not on file  Transportation Needs: Not on file  Physical Activity: Not on file  Stress: Not on file  (02/26/2023)  Social Connections: Not on file  Intimate Partner Violence: Low Risk  (08/06/2019)   Received from [facility]   Intimate Partner Violence    Insults You: Not on file    Threatens You: Not on file    Screams at [place]: Not on file    Physically Hurt: Not on file    Intimate Partner Violence Score: Not on file     Family History  Problem Relation Age of Onset   Ovarian cancer Mother    CAD Father     ROS: Otherwise negative unless mentioned in HPI  Physical Examination  Vitals:   02/28/23 1250  BP: 131/67  Pulse: 78  Resp: 20  Temp: 98.1 F (36.7 C)  SpO2: 98%  Body mass index is 31.87 kg/m.  General:  WDWN in NAD Gait: Not observed HENT: WNL, normocephalic Pulmonary: normal non-labored breathing, without Rales, rhonchi,  wheezing Cardiac: regular, without  Murmurs, rubs or gallops; without carotid bruits Abdomen: Positive bowel Sounds, soft, NT/ND, no masses Skin: without rashes Vascular Exam/Pulses: Strong palpable pulses to the left lower extremity and warm to touch.  Right lower extremity has very weak Doppler DP and PT pulses with blue discolored cool toes and ball of his foot.  Patient's calf knee and thigh are warm to touch. Extremities: with ischemic changes, without Gangrene , without cellulitis; without open wounds;  Musculoskeletal: no muscle wasting or atrophy  Neurologic: A&O X 3;  No focal weakness or paresthesias are detected; speech is fluent/normal Psychiatric:  The pt has Normal affect. Lymph:  Unremarkable  CBC    Component Value Date/Time   WBC 9.1 02/28/2023 1300   RBC 4.53 02/28/2023 1300   HGB 14.8 02/28/2023 1300   HGB 14.8 11/16/2021 1041   HCT 43.7 02/28/2023 1300   HCT 44.4 11/16/2021 1041   PLT 197 02/28/2023 1300   PLT 220 11/16/2021 1041   MCV 96.5 02/28/2023 1300   MCV 95 11/16/2021 1041   MCV 94 04/16/2013 1607   MCH 32.7 02/28/2023 1300   MCHC 33.9 02/28/2023 1300   RDW 12.0 02/28/2023 1300   RDW 12.3  11/16/2021 1041   RDW 12.5 04/16/2013 1607   LYMPHSABS 1.6 11/16/2021 1041   LYMPHSABS 1.4 04/16/2013 1607   MONOABS 0.9 12/12/2020 1450   MONOABS 0.7 04/16/2013 1607   EOSABS 0.2 11/16/2021 1041   EOSABS 0.1 04/16/2013 1607   BASOSABS 0.1 11/16/2021 1041   BASOSABS 0.0 04/16/2013 1607    BMET    Component Value Date/Time   NA 135 02/28/2023 1300   NA 141 11/15/2022 1436   NA 139 04/16/2013 1607   K 3.7 02/28/2023 1300   K 4.0 04/16/2013 1607   CL 101 02/28/2023 1300   CL 97 (L) 04/16/2013 1607   CO2 23 02/28/2023 1300   CO2 31 04/16/2013 1607   GLUCOSE 160 (H) 02/28/2023 1300   GLUCOSE 106 (H) 04/16/2013 1607   BUN 13 02/28/2023 1300   BUN 13 11/15/2022 1436   BUN 10 04/16/2013 1607   CREATININE 1.22 02/28/2023 1300   CREATININE 1.12 04/16/2013 1607   CALCIUM 8.9 02/28/2023 1300   CALCIUM 9.6 04/16/2013 1607   GFRNONAA >60 02/28/2023 1300   GFRNONAA >60 04/16/2013 1607   GFRAA 69 03/09/2020 1423   GFRAA >60 04/16/2013 1607    COAGS: No results found for: "INR", "PROTIME"   Non-Invasive Vascular Imaging:   LOWER EXTREMITY DOPPLER STUDY   Patient Name:  Alan Trevino  Date of Exam:   02/27/2023  Medical Rec #: [id number]        Accession #:    [id number]  Date of Birth: March 19, 1961        Patient Gender: M  Patient Age:   64 years  Exam Location:  [facility]  Procedure:      VAS [place] ABI WITH/WO TBI  Referring Phys: Caryn Bee PATEL    ---------------------------------------------------------------------------  -----    Indications: Claudication, and peripheral artery disease.   High Risk Factors: Hypertension, hyperlipidemia, past history of smoking.     Performing Technologist: Hardie Lora RVT     Examination Guidelines: A complete evaluation includes at minimum, Doppler  waveform signals and systolic blood pressure reading at the level of  bilateral  brachial, anterior tibial, and posterior tibial arteries, when vessel  segments  are  accessible. Bilateral testing is considered an integral part of a  complete  examination. Photoelectric Plethysmograph (PPG) waveforms and toe systolic  pressure readings are included as required and additional duplex testing  as  needed. Limited examinations for reoccurring indications may be performed  as  noted.     ABI Findings:  +---------+------------------+-----+----------+--------+  Right   Rt Pressure (mmHg)IndexWaveform  Comment   +---------+------------------+-----+----------+--------+  Brachial 129                                        +---------+------------------+-----+----------+--------+  PTA     109               0.81 monophasic          +---------+------------------+-----+----------+--------+  DP      103               0.77 monophasic          +---------+------------------+-----+----------+--------+  Great Toe77                0.57                     +---------+------------------+-----+----------+--------+   +---------+------------------+-----+---------+-------+  Left    Lt Pressure (mmHg)IndexWaveform Comment  +---------+------------------+-----+---------+-------+  Brachial 134                                      +---------+------------------+-----+---------+-------+  PTA     155               1.16 triphasic         +---------+------------------+-----+---------+-------+  DP      135               1.01 triphasic         +---------+------------------+-----+---------+-------+  Kerby Nora               0.87                   +---------+------------------+-----+---------+-------+   +-------+-----------+-----------+------------+------------+  ABI/TBIToday's ABIToday's TBIPrevious ABIPrevious TBI  +-------+-----------+-----------+------------+------------+  Right 0.81       0.57                                 +-------+-----------+-----------+------------+------------+  Left  1.16        0.87                                 +-------+-----------+-----------+------------+------------+   Summary:  Right: Resting right ankle-brachial index indicates mild right lower  extremity arterial disease. The right toe-brachial index is abnormal.   Limited imaging showed >50% mid right SFA stenosis.  Left: Resting left ankle-brachial index is within normal range. The left  toe-brachial index is normal.   Statin:  Yes.   Beta Blocker:  No. Aspirin:  No. ACEI:  Yes.   ARB:  No. CCB use:  Yes Other antiplatelets/anticoagulants:  No.    ASSESSMENT/PLAN: This is a 62 y.o. male who presents to [facility] after about a month of pain to his right lower extremity with worsening blue toes today.  Patient had an ultrasound on the outside which shows decreased ABIs in the right.  Vascular surgery plans on taking the patient to the vascular lab this afternoon for a right lower extremity angiogram with possible interventio  I discussed in detail with the patient the procedure, benefits, risk, and complications.  Patient verbalizes understanding and wishes to proceed as soon as possible.  I answered all the patient's questions this afternoon.  Patient endorses he has been n.p.o. since 8:00 this morning.  Patient endorses he does not take any blood thinning medication at this time.   -I discussed the plan with Dr. Levora Dredge MD and he is in agreement with the plan.   Marcie [facility] 02/28/2023 2:10 PM

## 2023-02-28 NOTE — Assessment & Plan Note (Signed)
BP stable Titrate home regimen 

## 2023-02-28 NOTE — H&P (Signed)
History and Physical    Patient: Alan Trevino TFT:[id number] DOB: 1960/06/14 DOA: 02/28/2023 DOS: the patient was seen and examined on 02/28/2023 PCP: Duanne Limerick, MD  Patient coming from: Home  Chief Complaint:  Chief Complaint  Patient presents with   Circulatory Problem   HPI: Alan Trevino is a 62 y.o. male with medical history significant of hypertension, hyperlipidemia, GERD, history of GI bleeding presenting with acute lower limb ischemia.  Patient reports worsening great toe pain and discoloration for the past 4 to 6 weeks.  Noted to have been seen in the ER October 11 was concern for great toe discoloration.  Was recommended to have podiatry follow-up.  Was seen by podiatry yesterday with noted worsening discoloration of the great toe as well as fifth toe.  Noted worsening pain.  Patient denies any known history of symptoms like this in the past.  Non-smoker.  No reported alcohol use.  No fevers or chills.  No prior history of DVT.  No remote history of GI bleeding in the past associated with NSAID use.  Patient denies any known trauma to the area. Presented to the ER afebrile, hemodynamically stable.  Satting well on room air.  White count 9.1, hemoglobin 14.8, platelets 197.  Creatinine 1.22, glucose 160. R great toe plain films obtained.  Vascular surgery consulted.  Started on heparin drip.  Tentative plan for angiogram. Review of Systems: As mentioned in the history of present illness. All other systems reviewed and are negative. Past Medical History:  Diagnosis Date   Arthritis    Depression    GERD (gastroesophageal reflux disease)    H/O ulcer disease    Headache    MIGRAINES   Hypertension    SBO (small bowel obstruction) (HCC)    Wears dentures    full upper   Past Surgical History:  Procedure Laterality Date   COLONOSCOPY WITH PROPOFOL N/A 12/29/2015   Procedure: COLONOSCOPY WITH PROPOFOL;  Surgeon: Midge Minium, MD;  Location: [facility];  Service:  Endoscopy;  Laterality: N/A;   COLONOSCOPY WITH PROPOFOL N/A 11/29/2019   Procedure: COLONOSCOPY WITH PROPOFOL;  Surgeon: Midge Minium, MD;  Location: [facility];  Service: Endoscopy;  Laterality: N/A;  priority 4   ESOPHAGEAL DILATION     stretching   ESOPHAGEAL DILATION  01/17/2017   Procedure: ESOPHAGEAL DILATION;  Surgeon: Midge Minium, MD;  Location: [facility];  Service: Gastroenterology;;   ESOPHAGOGASTRODUODENOSCOPY (EGD) WITH PROPOFOL N/A 11/29/2016   Procedure: ESOPHAGOGASTRODUODENOSCOPY (EGD) WITH PROPOFOL;  Surgeon: Midge Minium, MD;  Location: [facility] END[facility];  Service: Endoscopy;  Laterality: N/A;   ESOPHAGOGASTRODUODENOSCOPY (EGD) WITH PROPOFOL N/A 01/17/2017   Procedure: ESOPHAGOGASTRODUODENOSCOPY (EGD) WITH PROPOFOL;  Surgeon: Midge Minium, MD;  Location: [facility];  Service: Gastroenterology;  Laterality: N/A;   HERNIA REPAIR Right    INGUINAL HERNIA REPAIR Left 01/14/2020   Procedure: HERNIA REPAIR INGUINAL ADULT;  Surgeon: Earline Mayotte, MD;  Location: [facility] OR[facility];  Service: General;  Laterality: Left;   POLYPECTOMY N/A 12/29/2015   Procedure: POLYPECTOMY;  Surgeon: Midge Minium, MD;  Location: [facility];  Service: Endoscopy;  Laterality: N/A;   POLYPECTOMY  01/17/2017   Procedure: POLYPECTOMY;  Surgeon: Midge Minium, MD;  Location: [facility];  Service: Gastroenterology;;   POLYPECTOMY N/A 11/29/2019   Procedure: POLYPECTOMY;  Surgeon: Midge Minium, MD;  Location: [facility];  Service: Endoscopy;  Laterality: N/A;   SHOULDER SURGERY Right 09/20/2009   TOTAL HIP ARTHROPLASTY Right 12/21/2020  Procedure: TOTAL HIP ARTHROPLASTY ANTERIOR APPROACH;  Surgeon: Kennedy Bucker, MD;  Location: [facility] ORS;  Service: Orthopedics;  Laterality: Right;   Social History:  reports that he quit smoking about 24 years ago. His smoking use included cigarettes. He started smoking about 54 years ago. He has a 60 pack-year smoking history. His  smokeless tobacco use includes chew. He reports current alcohol use of about 12.0 standard drinks of alcohol per week. He reports that he does not use drugs.  Allergies  Allergen Reactions   Nsaids     Other Reaction(s): gi bleed    Family History  Problem Relation Age of Onset   Ovarian cancer Mother    CAD Father     Prior to Admission medications   Medication Sig Start Date End Date Taking? Authorizing Provider  acetaminophen (TYLENOL) 500 MG tablet Take 1,000 mg by mouth every 6 (six) hours as needed for moderate pain.    Yes [provider]  amLODipine-benazepril (LOTREL) 10-20 MG capsule Take 1 capsule by mouth daily. 11/15/22  Yes Duanne Limerick, MD  atorvastatin (LIPITOR) 10 MG tablet Take 1 tablet (10 mg total) by mouth daily. TAKE 1 TABLET BY MOUTH EVERY DAY 11/15/22  Yes Duanne Limerick, MD  buPROPion (WELLBUTRIN XL) 300 MG 24 hr tablet Take 300 mg by mouth daily.  10/11/13  Yes [provider]  busPIRone (BUSPAR) 30 MG tablet Take 30 mg by mouth 2 (two) times daily.    Yes [provider]  cyclobenzaprine (FLEXERIL) 5 MG tablet Take 1 tablet (5 mg total) by mouth at bedtime. 11/15/22  Yes Duanne Limerick, MD  methylphenidate (RITALIN) 20 MG tablet Take 10-20 mg by mouth 3 (three) times daily with meals.   Yes [provider]  oxyCODONE-acetaminophen (PERCOCET) 5-325 MG tablet Take 1 tablet by mouth every 4 (four) hours as needed for severe pain (pain score 7-10). Patient not taking: Reported on 02/27/2023 02/21/23   Candelaria Stagers, DPM  oxyCODONE-acetaminophen (PERCOCET) 5-325 MG tablet Take 1 tablet by mouth every 4 (four) hours as needed for severe pain (pain score 7-10). 02/27/23   Candelaria Stagers, DPM    Physical Exam: Vitals:   02/28/23 1250 02/28/23 1254 02/28/23 1610 02/28/23 1616  BP: 131/67  136/66 136/66  Pulse: 78  66 69  Resp: 20  16 17   Temp: 98.1 F (36.7 C)  98 F (36.7 C)   TempSrc: Oral  Oral   SpO2: 98%  91% 91%   Weight:  81.6 kg 81.6 kg   Height:  5\' 3"  (1.6 m) 5\' 3"  (1.6 m)    Physical Exam Constitutional:      Appearance: He is obese.  HENT:     Head: Normocephalic and atraumatic.     Nose: Nose normal.     Mouth/Throat:     Mouth: Mucous membranes are moist.  Eyes:     Pupils: Pupils are equal, round, and reactive to light.  Cardiovascular:     Rate and Rhythm: Normal rate and regular rhythm.     Pulses: Normal pulses.  Pulmonary:     Effort: Pulmonary effort is normal.  Abdominal:     General: Bowel sounds are normal.  Musculoskeletal:        General: Normal range of motion.  Neurological:     General: No focal deficit present.  Psychiatric:        Mood and Affect: Mood normal.     Data Reviewed:  There are  no new results to review at this time.  VAS [place] ABI WITH/WO TBI  LOWER EXTREMITY DOPPLER STUDY  Patient Name:  RYANN HANSBURY  Date of Exam:   02/27/2023 Medical Rec #: [id number]        Accession #:    [id number] Date of Birth: Feb 21, 1961        Patient Gender: M Patient Age:   74 years Exam Location:  [facility] Procedure:      VAS [place] ABI WITH/WO TBI Referring Phys: Caryn Bee PATEL  --------------------------------------------------------------------------------   Indications: Claudication, and peripheral artery disease.  High Risk Factors: Hypertension, hyperlipidemia, past history of smoking.   Performing Technologist: Hardie Lora RVT    Examination Guidelines: A complete evaluation includes at minimum, Doppler waveform signals and systolic blood pressure reading at the level of bilateral brachial, anterior tibial, and posterior tibial arteries, when vessel segments are accessible. Bilateral testing is considered an integral part of a complete examination. Photoelectric Plethysmograph (PPG) waveforms and toe systolic pressure readings are included as required and additional duplex testing as needed. Limited examinations for reoccurring  indications may be performed as noted.    ABI Findings: +---------+------------------+-----+----------+--------+ Right    Rt Pressure (mmHg)IndexWaveform  Comment  +---------+------------------+-----+----------+--------+ Brachial 129                                       +---------+------------------+-----+----------+--------+ PTA      109               0.81 monophasic         +---------+------------------+-----+----------+--------+ DP       103               0.77 monophasic         +---------+------------------+-----+----------+--------+ Great Toe77                0.57                    +---------+------------------+-----+----------+--------+  +---------+------------------+-----+---------+-------+ Left     Lt Pressure (mmHg)IndexWaveform Comment +---------+------------------+-----+---------+-------+ Brachial 134                                     +---------+------------------+-----+---------+-------+ PTA      155               1.16 triphasic        +---------+------------------+-----+---------+-------+ DP       135               1.01 triphasic        +---------+------------------+-----+---------+-------+ Kerby Nora               0.87                  +---------+------------------+-----+---------+-------+  +-------+-----------+-----------+------------+------------+ ABI/TBIToday's ABIToday's TBIPrevious ABIPrevious TBI +-------+-----------+-----------+------------+------------+ Right  0.81       0.57                                +-------+-----------+-----------+------------+------------+ Left   1.16       0.87                                +-------+-----------+-----------+------------+------------+  Summary: Right: Resting right ankle-brachial index indicates mild right lower extremity arterial disease. The right toe-brachial index is abnormal.  Limited imaging showed >50% mid right SFA  stenosis. Left: Resting left ankle-brachial index is within normal range. The left toe-brachial index is normal.  *See table(s) above for measurements and observations.    Electronically signed by Levora Dredge MD on 02/27/2023 at 6:01:54 PM.      Final    Lab Results  Component Value Date   WBC 9.1 02/28/2023   HGB 14.8 02/28/2023   HCT 43.7 02/28/2023   MCV 96.5 02/28/2023   PLT 197 02/28/2023   Last metabolic panel Lab Results  Component Value Date   GLUCOSE 160 (H) 02/28/2023   NA 135 02/28/2023   K 3.7 02/28/2023   CL 101 02/28/2023   CO2 23 02/28/2023   BUN 13 02/28/2023   CREATININE 1.22 02/28/2023   GFRNONAA >60 02/28/2023   CALCIUM 8.9 02/28/2023   PHOS 3.1 11/16/2021   PROT 6.8 11/15/2022   ALBUMIN 4.4 11/15/2022   LABGLOB 2.4 11/15/2022   AGRATIO 2.0 05/18/2021   BILITOT 0.5 11/15/2022   ALKPHOS 66 11/15/2022   AST 42 (H) 11/15/2022   ALT 37 11/15/2022   ANIONGAP 11 02/28/2023    Assessment and Plan: Acute lower limb ischemia Worsening right foot and toe pain over the past 4 to 6 weeks Previously evaluated by podiatry with concern for limb ischemia-redirected to the ER for further evaluation Noted decreased ABIs on the right Vascular surgery evaluated Started on heparin drip Pain control Follow-up vascular surgery recommendations  History of GI bleed Noted prior history of NSAID induced GI bleeding IV PPI in the setting of heparin drip Hemoglobin 14.8 today Trend Monitor for any black stools with downtrending hemoglobin Follow  HLD (hyperlipidemia) Continue statin  Essential (primary) hypertension BP stable Titrate home regimen      Advance Care Planning:   Code Status: Full Code   Consults: Vascular surgery   Family Communication: No family at the bedside   Severity of Illness: The appropriate patient status for this patient is INPATIENT. Inpatient status is judged to be reasonable and necessary in order to provide the  required intensity of service to ensure the patient's safety. The patient's presenting symptoms, physical exam findings, and initial radiographic and laboratory data in the context of their chronic comorbidities is felt to place them at high risk for further clinical deterioration. Furthermore, it is not anticipated that the patient will be medically stable for discharge from the hospital within 2 midnights of admission.   * I certify that at the point of admission it is my clinical judgment that the patient will require inpatient hospital care spanning beyond 2 midnights from the point of admission due to high intensity of service, high risk for further deterioration and high frequency of surveillance required.*  Author: Floydene Flock, MD 02/28/2023 4:41 PM  For on call review [email or web address].

## 2023-02-28 NOTE — Consult Note (Signed)
Hospital Consult    Reason for Consult:  Right Lower Extremity Claudication Requesting Physician:  Dr Willy Eddy MD  MRN #:  [id number]  History of Present Illness: This is a 62 y.o. male presents to the ER for evaluation of worsening pain with ambulation of his right leg particular his right great toe and the arch of his foot. Symptoms have been worsening over the past few weeks. Over the past day or so though he started having increasing bluish discoloration of the toe. Denies any specific injury. Does not currently smoke. Not on any anticoagulation or blood thinners.   On exam this afternoon patient is resting comfortably in the emergency room.  Patient endorses that the symptoms started about 4 weeks ago when he was gardening.  He endorses feeling something in his leg and his toes got swollen.  He then went and bought a larger pair shoes thinking that it was just a blister but they progressively got worse.  Over the last 3 to 4 days he has now had bad rest pain to the ball of his foot and his toes extending into the arch of his foot.  He endorses 10 out of 10 pain when he tries to walk on it.  Vascular surgery consulted to evaluate.  Past Medical History:  Diagnosis Date   Arthritis    Depression    GERD (gastroesophageal reflux disease)    H/O ulcer disease    Headache    MIGRAINES   Hypertension    SBO (small bowel obstruction) (HCC)    Wears dentures    full upper    Past Surgical History:  Procedure Laterality Date   COLONOSCOPY WITH PROPOFOL N/A 12/29/2015   Procedure: COLONOSCOPY WITH PROPOFOL;  Surgeon: Midge Minium, MD;  Location: [facility];  Service: Endoscopy;  Laterality: N/A;   COLONOSCOPY WITH PROPOFOL N/A 11/29/2019   Procedure: COLONOSCOPY WITH PROPOFOL;  Surgeon: Midge Minium, MD;  Location: [facility];  Service: Endoscopy;  Laterality: N/A;  priority 4   ESOPHAGEAL DILATION     stretching   ESOPHAGEAL DILATION  01/17/2017   Procedure:  ESOPHAGEAL DILATION;  Surgeon: Midge Minium, MD;  Location: [facility];  Service: Gastroenterology;;   ESOPHAGOGASTRODUODENOSCOPY (EGD) WITH PROPOFOL N/A 11/29/2016   Procedure: ESOPHAGOGASTRODUODENOSCOPY (EGD) WITH PROPOFOL;  Surgeon: Midge Minium, MD;  Location: [facility] ENDOSCOPY;  Service: Endoscopy;  Laterality: N/A;   ESOPHAGOGASTRODUODENOSCOPY (EGD) WITH PROPOFOL N/A 01/17/2017   Procedure: ESOPHAGOGASTRODUODENOSCOPY (EGD) WITH PROPOFOL;  Surgeon: Midge Minium, MD;  Location: [facility];  Service: Gastroenterology;  Laterality: N/A;   HERNIA REPAIR Right    INGUINAL HERNIA REPAIR Left 01/14/2020   Procedure: HERNIA REPAIR INGUINAL ADULT;  Surgeon: Earline Mayotte, MD;  Location: [facility] ORS;  Service: General;  Laterality: Left;   POLYPECTOMY N/A 12/29/2015   Procedure: POLYPECTOMY;  Surgeon: Midge Minium, MD;  Location: [facility];  Service: Endoscopy;  Laterality: N/A;   POLYPECTOMY  01/17/2017   Procedure: POLYPECTOMY;  Surgeon: Midge Minium, MD;  Location: [facility];  Service: Gastroenterology;;   POLYPECTOMY N/A 11/29/2019   Procedure: POLYPECTOMY;  Surgeon: Midge Minium, MD;  Location: [facility];  Service: Endoscopy;  Laterality: N/A;   SHOULDER SURGERY Right 09/20/2009   TOTAL HIP ARTHROPLASTY Right 12/21/2020   Procedure: TOTAL HIP ARTHROPLASTY ANTERIOR APPROACH;  Surgeon: Kennedy Bucker, MD;  Location: [facility] ORS;  Service: Orthopedics;  Laterality: Right;    No Known Allergies  Prior to Admission medications   Medication Sig Start  Date End Date Taking? Authorizing Provider  acetaminophen (TYLENOL) 500 MG tablet Take 1,000 mg by mouth every 6 (six) hours as needed for moderate pain.     [provider]  amLODipine-benazepril (LOTREL) 10-20 MG capsule Take 1 capsule by mouth daily. 11/15/22   Duanne Limerick, MD  atorvastatin (LIPITOR) 10 MG tablet Take 1 tablet (10 mg total) by mouth daily. TAKE 1 TABLET BY MOUTH EVERY DAY 11/15/22    Duanne Limerick, MD  buPROPion (WELLBUTRIN XL) 300 MG 24 hr tablet Take 300 mg by mouth daily.  10/11/13   [provider]  busPIRone (BUSPAR) 30 MG tablet Take 30 mg by mouth 2 (two) times daily.     [provider]  cyclobenzaprine (FLEXERIL) 5 MG tablet Take 1 tablet (5 mg total) by mouth at bedtime. 11/15/22   Duanne Limerick, MD  methylphenidate (RITALIN) 20 MG tablet Take 20 mg by mouth 2 (two) times daily with breakfast and lunch.     [provider]  oxyCODONE-acetaminophen (PERCOCET) 5-325 MG tablet Take 1 tablet by mouth every 4 (four) hours as needed for severe pain (pain score 7-10). Patient not taking: Reported on 02/27/2023 02/21/23   Candelaria Stagers, DPM  oxyCODONE-acetaminophen (PERCOCET) 5-325 MG tablet Take 1 tablet by mouth every 4 (four) hours as needed for severe pain (pain score 7-10). 02/27/23   Candelaria Stagers, DPM    Social History   Socioeconomic History   Marital status: Divorced    Spouse name: Not on file   Number of children: Not on file   Years of education: Not on file   Highest education level: Not on file  Occupational History   Not on file  Tobacco Use   Smoking status: Former    Current packs/day: 0.00    Average packs/day: 2.0 packs/day for 30.0 years (60.0 ttl pk-yrs)    Types: Cigarettes    Start date: 39    Quit date: 2000    Years since quitting: 24.8   Smokeless tobacco: Current    Types: Chew  Vaping Use   Vaping status: Never Used  Substance and Sexual Activity   Alcohol use: Yes    Alcohol/week: 12.0 standard drinks of alcohol    Types: 12 Cans of beer per week    Comment: BEER once a week   Drug use: No   Sexual activity: Not Currently  Other Topics Concern   Not on file  Social History Narrative   Not on file   Social Determinants of Health   Financial Resource Strain: Not on file  Food Insecurity: Not on file  Transportation Needs: Not on file  Physical Activity: Not on file  Stress: Not on file  (02/26/2023)  Social Connections: Not on file  Intimate Partner Violence: Low Risk  (08/06/2019)   Received from [facility]   Intimate Partner Violence    Insults You: Not on file    Threatens You: Not on file    Screams at [place]: Not on file    Physically Hurt: Not on file    Intimate Partner Violence Score: Not on file     Family History  Problem Relation Age of Onset   Ovarian cancer Mother    CAD Father     ROS: Otherwise negative unless mentioned in HPI  Physical Examination  Vitals:   02/28/23 1250  BP: 131/67  Pulse: 78  Resp: 20  Temp: 98.1 F (36.7 C)  SpO2: 98%  Body mass index is 31.87 kg/m.  General:  WDWN in NAD Gait: Not observed HENT: WNL, normocephalic Pulmonary: normal non-labored breathing, without Rales, rhonchi,  wheezing Cardiac: regular, without  Murmurs, rubs or gallops; without carotid bruits Abdomen: Positive bowel Sounds, soft, NT/ND, no masses Skin: without rashes Vascular Exam/Pulses: Strong palpable pulses to the left lower extremity and warm to touch.  Right lower extremity has very weak Doppler DP and PT pulses with blue discolored cool toes and ball of his foot.  Patient's calf knee and thigh are warm to touch. Extremities: with ischemic changes, without Gangrene , without cellulitis; without open wounds;  Musculoskeletal: no muscle wasting or atrophy  Neurologic: A&O X 3;  No focal weakness or paresthesias are detected; speech is fluent/normal Psychiatric:  The pt has Normal affect. Lymph:  Unremarkable  CBC    Component Value Date/Time   WBC 9.1 02/28/2023 1300   RBC 4.53 02/28/2023 1300   HGB 14.8 02/28/2023 1300   HGB 14.8 11/16/2021 1041   HCT 43.7 02/28/2023 1300   HCT 44.4 11/16/2021 1041   PLT 197 02/28/2023 1300   PLT 220 11/16/2021 1041   MCV 96.5 02/28/2023 1300   MCV 95 11/16/2021 1041   MCV 94 04/16/2013 1607   MCH 32.7 02/28/2023 1300   MCHC 33.9 02/28/2023 1300   RDW 12.0 02/28/2023 1300   RDW 12.3  11/16/2021 1041   RDW 12.5 04/16/2013 1607   LYMPHSABS 1.6 11/16/2021 1041   LYMPHSABS 1.4 04/16/2013 1607   MONOABS 0.9 12/12/2020 1450   MONOABS 0.7 04/16/2013 1607   EOSABS 0.2 11/16/2021 1041   EOSABS 0.1 04/16/2013 1607   BASOSABS 0.1 11/16/2021 1041   BASOSABS 0.0 04/16/2013 1607    BMET    Component Value Date/Time   NA 135 02/28/2023 1300   NA 141 11/15/2022 1436   NA 139 04/16/2013 1607   K 3.7 02/28/2023 1300   K 4.0 04/16/2013 1607   CL 101 02/28/2023 1300   CL 97 (L) 04/16/2013 1607   CO2 23 02/28/2023 1300   CO2 31 04/16/2013 1607   GLUCOSE 160 (H) 02/28/2023 1300   GLUCOSE 106 (H) 04/16/2013 1607   BUN 13 02/28/2023 1300   BUN 13 11/15/2022 1436   BUN 10 04/16/2013 1607   CREATININE 1.22 02/28/2023 1300   CREATININE 1.12 04/16/2013 1607   CALCIUM 8.9 02/28/2023 1300   CALCIUM 9.6 04/16/2013 1607   GFRNONAA >60 02/28/2023 1300   GFRNONAA >60 04/16/2013 1607   GFRAA 69 03/09/2020 1423   GFRAA >60 04/16/2013 1607    COAGS: No results found for: "INR", "PROTIME"   Non-Invasive Vascular Imaging:   LOWER EXTREMITY DOPPLER STUDY   Patient Name:  ULMER EREKSON  Date of Exam:   02/27/2023  Medical Rec #: [id number]        Accession #:    [id number]  Date of Birth: March 19, 1961        Patient Gender: M  Patient Age:   64 years  Exam Location:  [facility]  Procedure:      VAS [place] ABI WITH/WO TBI  Referring Phys: Caryn Bee PATEL    ---------------------------------------------------------------------------  -----    Indications: Claudication, and peripheral artery disease.   High Risk Factors: Hypertension, hyperlipidemia, past history of smoking.     Performing Technologist: Hardie Lora RVT     Examination Guidelines: A complete evaluation includes at minimum, Doppler  waveform signals and systolic blood pressure reading at the level of  bilateral  brachial, anterior tibial, and posterior tibial arteries, when vessel  segments  are  accessible. Bilateral testing is considered an integral part of a  complete  examination. Photoelectric Plethysmograph (PPG) waveforms and toe systolic  pressure readings are included as required and additional duplex testing  as  needed. Limited examinations for reoccurring indications may be performed  as  noted.     ABI Findings:  +---------+------------------+-----+----------+--------+  Right   Rt Pressure (mmHg)IndexWaveform  Comment   +---------+------------------+-----+----------+--------+  Brachial 129                                        +---------+------------------+-----+----------+--------+  PTA     109               0.81 monophasic          +---------+------------------+-----+----------+--------+  DP      103               0.77 monophasic          +---------+------------------+-----+----------+--------+  Great Toe77                0.57                     +---------+------------------+-----+----------+--------+   +---------+------------------+-----+---------+-------+  Left    Lt Pressure (mmHg)IndexWaveform Comment  +---------+------------------+-----+---------+-------+  Brachial 134                                      +---------+------------------+-----+---------+-------+  PTA     155               1.16 triphasic         +---------+------------------+-----+---------+-------+  DP      135               1.01 triphasic         +---------+------------------+-----+---------+-------+  Kerby Nora               0.87                   +---------+------------------+-----+---------+-------+   +-------+-----------+-----------+------------+------------+  ABI/TBIToday's ABIToday's TBIPrevious ABIPrevious TBI  +-------+-----------+-----------+------------+------------+  Right 0.81       0.57                                 +-------+-----------+-----------+------------+------------+  Left  1.16        0.87                                 +-------+-----------+-----------+------------+------------+   Summary:  Right: Resting right ankle-brachial index indicates mild right lower  extremity arterial disease. The right toe-brachial index is abnormal.   Limited imaging showed >50% mid right SFA stenosis.  Left: Resting left ankle-brachial index is within normal range. The left  toe-brachial index is normal.   Statin:  Yes.   Beta Blocker:  No. Aspirin:  No. ACEI:  Yes.   ARB:  No. CCB use:  Yes Other antiplatelets/anticoagulants:  No.    ASSESSMENT/PLAN: This is a 62 y.o. male who presents to [facility] after about a month of pain to his right lower extremity with worsening blue toes today.  Patient had an ultrasound on the outside which shows decreased ABIs in the right.  Vascular surgery plans on taking the patient to the vascular lab this afternoon for a right lower extremity angiogram with possible interventio  I discussed in detail with the patient the procedure, benefits, risk, and complications.  Patient verbalizes understanding and wishes to proceed as soon as possible.  I answered all the patient's questions this afternoon.  Patient endorses he has been n.p.o. since 8:00 this morning.  Patient endorses he does not take any blood thinning medication at this time.   -I discussed the plan with Dr. Levora Dredge MD and he is in agreement with the plan.   Marcie [facility] 02/28/2023 2:10 PM

## 2023-02-28 NOTE — Assessment & Plan Note (Deleted)
Noted prior history of NSAID induced GI bleeding IV PPI in the setting of heparin drip Hemoglobin 14.8 today Trend Monitor for any black stools with downtrending hemoglobin Follow

## 2023-02-28 NOTE — Assessment & Plan Note (Signed)
Worsening right foot and toe pain over the past 4 to 6 weeks Previously evaluated by podiatry with concern for limb ischemia-redirected to the ER for further evaluation Noted decreased ABIs on the right Vascular surgery evaluated Started on heparin drip Pain control Follow-up vascular surgery recommendations

## 2023-02-28 NOTE — Interval H&P Note (Signed)
History and Physical Interval Note:  02/28/2023 4:27 PM  Alan Trevino  has presented today for surgery, with the diagnosis of PAD.  The various methods of treatment have been discussed with the patient and family. After consideration of risks, benefits and other options for treatment, the patient has consented to  Procedure(s): Lower Extremity Angiography (Right) as a surgical intervention.  The patient's history has been reviewed, patient examined, no change in status, stable for surgery.  I have reviewed the patient's chart and labs.  Questions were answered to the patient's satisfaction.     Levora Dredge

## 2023-02-28 NOTE — ED Provider Notes (Signed)
[facility] Provider Note    Event Date/Time   First MD Initiated Contact with Patient 02/28/23 1330     (approximate)   History   Circulatory Problem   HPI  Alan Trevino is a 62 y.o. male presents to the ER for evaluation of worsening pain with ambulation of his right leg particular his right great toe and the arch of his foot.  Symptoms have been worsening over the past few weeks.  Over the past day or so though he started having increasing bluish discoloration of the toe.  Denies any specific injury.  Does not currently smoke.  Not on any anticoagulation or blood thinners.     Physical Exam   Triage Vital Signs: ED Triage Vitals  Encounter Vitals Group     BP 02/28/23 1250 131/67     Systolic BP Percentile --      Diastolic BP Percentile --      Pulse Rate 02/28/23 1250 78     Resp 02/28/23 1250 20     Temp 02/28/23 1250 98.1 F (36.7 C)     Temp Source 02/28/23 1250 Oral     SpO2 02/28/23 1250 98 %     Weight 02/28/23 1254 179 lb 14.3 oz (81.6 kg)     Height 02/28/23 1254 5\' 3"  (1.6 m)     Head Circumference --      Peak Flow --      Pain Score 02/28/23 1253 5     Pain Loc --      Pain Education --      Exclude from Growth Chart --     Most recent vital signs: Vitals:   02/28/23 1250  BP: 131/67  Pulse: 78  Resp: 20  Temp: 98.1 F (36.7 C)  SpO2: 98%     Constitutional: Alert  Eyes: Conjunctivae are normal.  Head: Atraumatic. Nose: No congestion/rhinnorhea. Mouth/Throat: Mucous membranes are moist.   Neck: Painless ROM.  Cardiovascular:   Good peripheral circulation. Respiratory: Normal respiratory effort.  No retractions.  Gastrointestinal: Soft and nontender.  Musculoskeletal:  no deformity, bluish discoloration with delayed cap refill of the right great toe.  Doppler signal present to DP and PT of rle Neurologic:  MAE spontaneously. No gross focal neurologic deficits are appreciated.  Skin:  Skin is warm, dry and  intact. No rash noted. Psychiatric: Mood and affect are normal. Speech and behavior are normal.    ED Results / Procedures / Treatments   Labs (all labs ordered are listed, but only abnormal results are displayed) Labs Reviewed  BASIC METABOLIC PANEL - Abnormal; Notable for the following components:      Result Value   Glucose, Bld 160 (*)    All other components within normal limits  CBC  APTT  PROTIME-INR  HEPARIN LEVEL (UNFRACTIONATED)     EKG  ED ECG REPORT I, Willy Eddy, the attending physician, personally viewed and interpreted this ECG.   Date: 02/28/2023  EKG Time: 14:47  Rate: 60  Rhythm: sinus  Axis: normal  Intervals: normal  ST&T Change: no stemi, no depressions    RADIOLOGY Please see ED Course for my review and interpretation.  I personally reviewed all radiographic images ordered to evaluate for the above acute complaints and reviewed radiology reports and findings.  These findings were personally discussed with the patient.  Please see medical record for radiology report.    PROCEDURES:  Critical Care performed: Yes, see critical care procedure note(s)  .  Critical Care  Performed by: Willy Eddy, MD Authorized by: Willy Eddy, MD   Critical care provider statement:    Critical care time (minutes):  35   Critical care was necessary to treat or prevent imminent or life-threatening deterioration of the following conditions:  Circulatory failure   Critical care was time spent personally by me on the following activities:  Ordering and performing treatments and interventions, ordering and review of laboratory studies, ordering and review of radiographic studies, pulse oximetry, re-evaluation of patient's condition, review of old charts, obtaining history from patient or surrogate, examination of patient, evaluation of patient's response to treatment, discussions with primary provider, discussions with consultants and development of  treatment plan with patient or surrogate    MEDICATIONS ORDERED IN ED: Medications  morphine (PF) 4 MG/ML injection 4 mg (has no administration in time range)  ondansetron (ZOFRAN) injection 4 mg (has no administration in time range)  heparin ADULT infusion 100 units/mL (25000 units/241mL) (has no administration in time range)  heparin bolus via infusion 5,200 Units (has no administration in time range)     IMPRESSION / MDM / ASSESSMENT AND PLAN / ED COURSE  I reviewed the triage vital signs and the nursing notes.                              Differential diagnosis includes, but is not limited to, claudication, rest pain, critical limb ischemia, osteo-, gangrene  Patient presenting to the ER for evaluation of symptoms as described above.  Based on symptoms, risk factors and considered above differential, this presenting complaint could reflect a potentially life-threatening illness therefore the patient will be placed on continuous pulse oximetry and telemetry for monitoring.  Laboratory evaluation will be sent to evaluate for the above complaints.  Exam is concerning for critical limb ischemia claudication symptoms are worsening now with rest pain.  He does have peripheral perfusion and must have some collateralization but becoming increasingly symptomatic given skin changes will heparinize will consult vascular surgery will consult hospitalist for admission have ordered IV morphine for pain.        FINAL CLINICAL IMPRESSION(S) / ED DIAGNOSES   Final diagnoses:  Purple toe syndrome, right (HCC)     Rx / DC Orders   ED Discharge Orders     None        Note:  This document was prepared using Dragon voice recognition software and may include unintentional dictation errors.    Willy Eddy, MD 02/28/23 1452

## 2023-03-01 DIAGNOSIS — I70229 Atherosclerosis of native arteries of extremities with rest pain, unspecified extremity: Secondary | ICD-10-CM | POA: Diagnosis not present

## 2023-03-01 LAB — HIV ANTIBODY (ROUTINE TESTING W REFLEX): HIV Screen 4th Generation wRfx: NONREACTIVE

## 2023-03-01 LAB — COMPREHENSIVE METABOLIC PANEL
ALT: 34 U/L (ref 0–44)
AST: 36 U/L (ref 15–41)
Albumin: 4.2 g/dL (ref 3.5–5.0)
Alkaline Phosphatase: 58 U/L (ref 38–126)
Anion gap: 10 (ref 5–15)
BUN: 14 mg/dL (ref 8–23)
CO2: 23 mmol/L (ref 22–32)
Calcium: 8.7 mg/dL — ABNORMAL LOW (ref 8.9–10.3)
Chloride: 103 mmol/L (ref 98–111)
Creatinine, Ser: 1.36 mg/dL — ABNORMAL HIGH (ref 0.61–1.24)
GFR, Estimated: 58 mL/min — ABNORMAL LOW (ref >60)
Glucose, Bld: 96 mg/dL (ref 70–99)
Potassium: 4.3 mmol/L (ref 3.5–5.1)
Sodium: 136 mmol/L (ref 135–145)
Total Bilirubin: 0.9 mg/dL (ref <1.2)
Total Protein: 7.4 g/dL (ref 6.5–8.1)

## 2023-03-01 LAB — CBC
HCT: 44 % (ref 39.0–52.0)
Hemoglobin: 15 g/dL (ref 13.0–17.0)
MCH: 32.8 pg (ref 26.0–34.0)
MCHC: 34.1 g/dL (ref 30.0–36.0)
MCV: 96.3 fL (ref 80.0–100.0)
Platelets: 186 10*3/uL (ref 150–400)
RBC: 4.57 MIL/uL (ref 4.22–5.81)
RDW: 12 % (ref 11.5–15.5)
WBC: 8.7 10*3/uL (ref 4.0–10.5)
nRBC: 0 % (ref 0.0–0.2)

## 2023-03-01 LAB — HEPARIN LEVEL (UNFRACTIONATED)
Heparin Unfractionated: 0.68 [IU]/mL (ref 0.30–0.70)
Heparin Unfractionated: 0.62 [IU]/mL (ref 0.30–0.70)

## 2023-03-01 NOTE — Progress Notes (Signed)
[facility] Progress Note  Patient: Alan Trevino    NWG:[id number]  DOA: 02/28/2023     Date of Service: the patient was seen and examined on 03/01/2023  Chief Complaint  Patient presents with   Circulatory Problem   Brief hospital course: Alan Trevino is a 62 y.o. male with medical history significant of hypertension, hyperlipidemia, GERD, history of GI bleeding presenting with acute lower limb ischemia.  Patient reports worsening great toe pain and discoloration for the past 4 to 6 weeks.  Noted to have been seen in the ER October 11 was concern for great toe discoloration.  Was recommended to have podiatry follow-up.  Was seen by podiatry yesterday with noted worsening discoloration of the great toe as well as fifth toe.  Noted worsening pain.  Patient denies any known history of symptoms like this in the past.  Non-smoker.  No reported alcohol use.  No fevers or chills.  No prior history of DVT.  No remote history of GI bleeding in the past associated with NSAID use.  Patient denies any known trauma to the area. Presented to the ER afebrile, hemodynamically stable.  Satting well on room air.  White count 9.1, hemoglobin 14.8, platelets 197.  Creatinine 1.22, glucose 160. R great toe plain films obtained.  Vascular surgery consulted.  Started on heparin drip.  Tentative plan for angiogram.   Assessment and Plan:  # Acute right lower limb ischemia Worsening right foot and toe pain over the past 4 to 6 weeks Previously evaluated by podiatry with concern for limb ischemia-redirected to the ER for further evaluation Noted decreased ABIs on the right Vascular surgery evaluated Started Plavix 75 mg p.o. daily Started on heparin drip Pain control Follow-up vascular surgery recommendations   History of GI bleed Noted prior history of NSAID induced GI bleeding IV PPI in the setting of heparin drip Hemoglobin 15 stable  Trend Monitor for any black stools with downtrending  hemoglobin Follow   HLD (hyperlipidemia) Continue statin   Essential (primary) hypertension Continue amlodipine 10 mg and benazepril 20 mg p.o. daily Monitor BP and titrate home regimen     Body mass index is 31.87 kg/m.  Interventions:  Diet: Heart healthy diet DVT Prophylaxis: Therapeutic Anticoagulation with heparin IV infusion    Advance goals of care discussion: Full code  Family Communication: family was not present at bedside, at the time of interview.  The pt provided permission to discuss medical plan with the family. Opportunity was given to ask question and all questions were answered satisfactorily.   Disposition:  Pt is from home, admitted with ischemia from right toes, still on IV heparin infusion, needs angioplasty, which precludes a safe discharge. Discharge to home, when stable and cleared by vascular surgery.  Subjective: No significant events overnight, patient has pain in the right foot it was 7/10, pain improving after pain medication 5/10 now.  Denies any other complaints.  Physical Exam: General: NAD, lying comfortably Appear in no distress, affect appropriate Eyes: PERRLA ENT: Oral Mucosa Clear, moist  Neck: no JVD,  Cardiovascular: S1 and S2 Present, no Murmur,  Respiratory: good respiratory effort, Bilateral Air entry equal and Decreased, no Crackles, no wheezes Abdomen: Bowel Sound present, Soft and no tenderness,  Skin: no rashes Extremities: no Pedal edema, no calf tenderness, right foot first, third and fourth toe erythema and tenderness. Neurologic: without any new focal findings Gait not checked due to patient safety concerns  Vitals:   02/28/23 1833 02/28/23 2022  03/01/23 0429 03/01/23 0742  BP: (!) 161/80 137/74 (!) 142/79 137/77  Pulse: 70 66 74 70  Resp: 16 17 17 16   Temp: 97.7 F (36.5 C) 97.7 F (36.5 C)  97.8 F (36.6 C)  TempSrc: Oral     SpO2: 93% 96% 96% 95%  Weight:      Height:        Intake/Output Summary (Last 24  hours) at 03/01/2023 1233 Last data filed at 03/01/2023 0335 Gross per 24 hour  Intake 103.04 ml  Output 300 ml  Net -196.96 ml   Filed Weights   02/28/23 1254 02/28/23 1610  Weight: 81.6 kg 81.6 kg    Data Reviewed: I have personally reviewed and interpreted daily labs, tele strips, imagings as discussed above. I reviewed all nursing notes, pharmacy notes, vitals, pertinent old records I have discussed plan of care as described above with RN and patient/family.  CBC: Recent Labs  Lab 02/28/23 1300 03/01/23 0218  WBC 9.1 8.7  HGB 14.8 15.0  HCT 43.7 44.0  MCV 96.5 96.3  PLT 197 186   Basic Metabolic Panel: Recent Labs  Lab 02/28/23 1300 03/01/23 0218  NA 135 136  K 3.7 4.3  CL 101 103  CO2 23 23  GLUCOSE 160* 96  BUN 13 14  CREATININE 1.22 1.36*  CALCIUM 8.9 8.7*    Studies: PERIPHERAL VASCULAR CATHETERIZATION  Result Date: 02/28/2023 See surgical note for result.  DG Toe Great Right  Result Date: 02/28/2023 CLINICAL DATA:  Pain and swelling in the great toe, discoloration EXAM: RIGHT GREAT TOE COMPARISON:  01/31/2023 FINDINGS: Stable appearance of the lucency with thin rim along the medial base of the proximal phalanx great toe, probably a geode or degenerative subcortical cystic lesion. No overlying cortical destructive findings to suggest osteomyelitis. There is mild spurring at the first MTP joint. The interphalangeal joint appears unremarkable. No significant gas tracking in the soft tissues. No foreign body noted. IMPRESSION: 1. No radiographic findings of osteomyelitis. 2. Mild spurring at the first MTP joint. 3. Stable appearance of the lucency with thin rim along the medial base of the proximal phalanx great toe, probably a geode or degenerative subcortical cystic lesion. Electronically Signed   By: Gaylyn Rong M.D.   On: 02/28/2023 16:51    Scheduled Meds:  amLODipine  10 mg Oral Daily   And   benazepril  20 mg Oral Daily   atorvastatin  10 mg  Oral Daily   buPROPion  300 mg Oral Daily   busPIRone  30 mg Oral BID   clopidogrel  75 mg Oral Q breakfast   methylphenidate  20 mg Oral BID WC   pantoprazole (PROTONIX) IV  40 mg Intravenous Q12H   sodium chloride flush  3 mL Intravenous Q12H   Continuous Infusions:  sodium chloride     heparin 1,250 Units/hr (03/01/23 1025)   PRN Meds: sodium chloride, acetaminophen, methylphenidate, morphine injection, ondansetron **OR** ondansetron (ZOFRAN) IV, sodium chloride flush  Time spent: 55 minutes  Author: Gillis Santa. MD [facility] 03/01/2023 12:33 PM  To reach On-call, see care teams to locate the attending and reach out to them via [email or web address]. If 7PM-7AM, please contact night-coverage If you still have difficulty reaching the attending provider, please page the [facility] (Director on Call) for [facility] on amion for assistance.

## 2023-03-01 NOTE — Consult Note (Signed)
Pharmacy Consult Note - Anticoagulation  Pharmacy Consult for heparin Indication:  limb ischemia  PATIENT MEASUREMENTS: Height: 5\' 3"  (160 cm) Weight: 81.6 kg (179 lb 14.3 oz) IBW/kg (Calculated) : 56.9 HEPARIN DW (KG): 74.3  VITAL SIGNS: Temp: 97.7 F (36.5 C) (11/08 1833) Temp Source: Oral (11/08 1833) BP: 137/74 (11/08 2022) Pulse Rate: 66 (11/08 2022)  Recent Labs    02/28/23 1430 03/01/23 0218  HGB  --  15.0  HCT  --  44.0  PLT  --  186  APTT >200*  --   LABPROT 14.3  --   INR 1.1  --   HEPARINUNFRC  --  0.68  CREATININE  --  1.36*    Estimated Creatinine Clearance: 52.5 mL/min (A) (by C-G formula based on SCr of 1.36 mg/dL (H)).  PAST MEDICAL HISTORY: Past Medical History:  Diagnosis Date   Arthritis    Depression    GERD (gastroesophageal reflux disease)    H/O ulcer disease    Headache    MIGRAINES   Hypertension    SBO (small bowel obstruction) (HCC)    Wears dentures    full upper    ASSESSMENT: 62 y.o. male with PMH including PVD is presenting with limb ischemia. Recent ABIs shows monophasic flow to right leg. Pt at high risk of amputation unless blood flow can be restored. Patient is not on chronic anticoagulation per chart review. Pharmacy has been consulted to initiate and manage heparin intravenous infusion.  Pertinent medications: No chronic anticoagulation PTA per chart review  Goal(s) of therapy: Heparin level 0.3 - 0.7 units/mL Monitor platelets by anticoagulation protocol: Yes   Baseline anticoagulation labs: Recent Labs    02/28/23 1300 02/28/23 1430 03/01/23 0218  APTT  --  >200*  --   INR  --  1.1  --   HGB 14.8  --  15.0  PLT 197  --  186    Date Time aPTT/HL Rate/Comment 11/9     0218      0.68               therapeutic X 1    PLAN: 11/9:  HL @ 0218 = 0.68, therapeutic X 1 - Will continue pt on current rate and recheck HL on 11/9 @ 0800. Monitor CBC daily while on heparin infusion.   Nadeen Shipman D Clinical  Pharmacist 03/01/2023 3:05 AM

## 2023-03-01 NOTE — Consult Note (Signed)
Pharmacy Consult Note - Anticoagulation  Pharmacy Consult for heparin Indication:  limb ischemia  PATIENT MEASUREMENTS: Height: 5\' 3"  (160 cm) Weight: 81.6 kg (179 lb 14.3 oz) IBW/kg (Calculated) : 56.9 HEPARIN DW (KG): 74.3  VITAL SIGNS: Temp: 97.8 F (36.6 C) (11/09 0742) BP: 137/77 (11/09 0742) Pulse Rate: 70 (11/09 0742)  Recent Labs    02/28/23 1300 02/28/23 1430 03/01/23 0218 03/01/23 0728  HGB  --   --  15.0  --   HCT  --   --  44.0  --   PLT  --   --  186  --   APTT  --  >200*  --   --   LABPROT  --  14.3  --   --   INR  --  1.1  --   --   HEPARINUNFRC   < >  --  0.68 0.62  CREATININE  --   --  1.36*  --    < > = values in this interval not displayed.    Estimated Creatinine Clearance: 52.5 mL/min (A) (by C-G formula based on SCr of 1.36 mg/dL (H)).  PAST MEDICAL HISTORY: Past Medical History:  Diagnosis Date   Arthritis    Depression    GERD (gastroesophageal reflux disease)    H/O ulcer disease    Headache    MIGRAINES   Hypertension    SBO (small bowel obstruction) (HCC)    Wears dentures    full upper    ASSESSMENT: 62 y.o. male with PMH including PVD is presenting with limb ischemia. Recent ABIs shows monophasic flow to right leg. Pt at high risk of amputation unless blood flow can be restored. Patient is not on chronic anticoagulation per chart review. Pharmacy has been consulted to initiate and manage heparin intravenous infusion.  Pertinent medications: No chronic anticoagulation PTA per chart review  Goal(s) of therapy: Heparin level 0.3 - 0.7 units/mL Monitor platelets by anticoagulation protocol: Yes   Baseline anticoagulation labs: Recent Labs    02/28/23 1300 02/28/23 1430 03/01/23 0218  APTT  --  >200*  --   INR  --  1.1  --   HGB 14.8  --  15.0  PLT 197  --  186    Date Time aPTT/HL Rate/Comment 11/9     0218      0.68               therapeutic X 1  11/9 0728  0.62  Therapeutic x 2   PLAN: 11/9 4696  HL=  0.62 Therapeutic x 2 - Will continue pt on current rate of 1250 units/hr and check next HL with am labs Monitor CBC daily while on heparin infusion.   Bari Mantis PharmD Clinical Pharmacist 03/01/2023

## 2023-03-02 DIAGNOSIS — I70221 Atherosclerosis of native arteries of extremities with rest pain, right leg: Secondary | ICD-10-CM

## 2023-03-02 LAB — CBC
HCT: 43.3 % (ref 39.0–52.0)
Hemoglobin: 15 g/dL (ref 13.0–17.0)
MCH: 32.2 pg (ref 26.0–34.0)
MCHC: 34.6 g/dL (ref 30.0–36.0)
MCV: 92.9 fL (ref 80.0–100.0)
Platelets: 205 10*3/uL (ref 150–400)
RBC: 4.66 MIL/uL (ref 4.22–5.81)
RDW: 11.9 % (ref 11.5–15.5)
WBC: 10.1 10*3/uL (ref 4.0–10.5)
nRBC: 0 % (ref 0.0–0.2)

## 2023-03-02 LAB — PHOSPHORUS: Phosphorus: 3.6 mg/dL (ref 2.5–4.6)

## 2023-03-02 LAB — BASIC METABOLIC PANEL
Anion gap: 10 (ref 5–15)
BUN: 12 mg/dL (ref 8–23)
CO2: 23 mmol/L (ref 22–32)
Calcium: 9.5 mg/dL (ref 8.9–10.3)
Chloride: 104 mmol/L (ref 98–111)
Creatinine, Ser: 1.13 mg/dL (ref 0.61–1.24)
GFR, Estimated: >60 mL/min (ref >60)
Glucose, Bld: 114 mg/dL — ABNORMAL HIGH (ref 70–99)
Potassium: 4.4 mmol/L (ref 3.5–5.1)
Sodium: 137 mmol/L (ref 135–145)

## 2023-03-02 LAB — MAGNESIUM: Magnesium: 2.2 mg/dL (ref 1.7–2.4)

## 2023-03-02 LAB — HEPARIN LEVEL (UNFRACTIONATED): Heparin Unfractionated: 0.53 [IU]/mL (ref 0.30–0.70)

## 2023-03-02 MED ORDER — BISACODYL 5 MG PO TBEC
10.0000 mg | DELAYED_RELEASE_TABLET | Freq: Every day | ORAL | Status: DC
  Administered 2023-03-02: 10 mg via ORAL
  Filled 2023-03-02: qty 2

## 2023-03-02 MED ORDER — BISACODYL 5 MG PO TBEC
10.0000 mg | DELAYED_RELEASE_TABLET | Freq: Once | ORAL | Status: AC
  Administered 2023-03-02: 10 mg via ORAL
  Filled 2023-03-02: qty 2

## 2023-03-02 MED ORDER — ASPIRIN 81 MG PO CHEW
81.0000 mg | CHEWABLE_TABLET | Freq: Every day | ORAL | Status: DC
Start: 2023-03-02 — End: 2023-03-03
  Administered 2023-03-02 – 2023-03-03 (×2): 81 mg via ORAL
  Filled 2023-03-02 (×3): qty 1

## 2023-03-02 MED ORDER — PANTOPRAZOLE SODIUM 40 MG PO TBEC
40.0000 mg | DELAYED_RELEASE_TABLET | Freq: Two times a day (BID) | ORAL | Status: DC
  Administered 2023-03-02 – 2023-03-03 (×2): 40 mg via ORAL
  Filled 2023-03-02 (×2): qty 1

## 2023-03-02 MED ORDER — POLYETHYLENE GLYCOL 3350 17 G PO PACK
17.0000 g | PACK | Freq: Two times a day (BID) | ORAL | Status: DC
  Administered 2023-03-02 – 2023-03-03 (×3): 17 g via ORAL
  Filled 2023-03-02 (×3): qty 1

## 2023-03-02 MED ORDER — BISACODYL 10 MG RE SUPP: 10.0000 mg | Freq: Every day | RECTAL | Status: DC | PRN

## 2023-03-02 MED ORDER — ATORVASTATIN CALCIUM 20 MG PO TABS: 40.0000 mg | ORAL_TABLET | Freq: Every day | ORAL | Status: DC

## 2023-03-02 NOTE — Progress Notes (Signed)
[facility]  Daily Progress Note   Subjective  -   No complaints.  Right great toe hurts but improved.  No ulceration just discoloration.  Objective Vitals:   03/01/23 1635 03/01/23 1921 03/02/23 0424 03/02/23 0836  BP: 129/67 129/78 (!) 155/78 134/69  Pulse: 68 69 81 72  Resp: 16 18 18 16   Temp: 98.1 F (36.7 C) 98.1 F (36.7 C) 98.5 F (36.9 C) 98.3 F (36.8 C)  TempSrc:  Oral  Oral  SpO2: 95% 93% 94% 91%  Weight:      Height:        Intake/Output Summary (Last 24 hours) at 03/02/2023 1423 Last data filed at 03/02/2023 0429 Gross per 24 hour  Intake 308.99 ml  Output 1600 ml  Net -1291.01 ml    PULM  CTAB CV  RRR VASC  Palpable right DP pulse and normal left groin access site.  Laboratory CBC    Component Value Date/Time   WBC 10.1 03/02/2023 0713   HGB 15.0 03/02/2023 0713   HGB 14.8 11/16/2021 1041   HCT 43.3 03/02/2023 0713   HCT 44.4 11/16/2021 1041   PLT 205 03/02/2023 0713   PLT 220 11/16/2021 1041    BMET    Component Value Date/Time   NA 137 03/02/2023 0713   NA 141 11/15/2022 1436   NA 139 04/16/2013 1607   K 4.4 03/02/2023 0713   K 4.0 04/16/2013 1607   CL 104 03/02/2023 0713   CL 97 (L) 04/16/2013 1607   CO2 23 03/02/2023 0713   CO2 31 04/16/2013 1607   GLUCOSE 114 (H) 03/02/2023 0713   GLUCOSE 106 (H) 04/16/2013 1607   BUN 12 03/02/2023 0713   BUN 13 11/15/2022 1436   BUN 10 04/16/2013 1607   CREATININE 1.13 03/02/2023 0713   CREATININE 1.12 04/16/2013 1607   CALCIUM 9.5 03/02/2023 0713   CALCIUM 9.6 04/16/2013 1607   GFRNONAA >60 03/02/2023 0713   GFRNONAA >60 04/16/2013 1607   GFRAA 69 03/09/2020 1423   GFRAA >60 04/16/2013 1607    Assessment/Planning: POD #2 s/p angioplasty and stenting of right SFA embolic lesion (non-occlusive)  Doing well and can be discharged home anytime by Medical Service.  On ASA and Plavix.     Iline Oven  03/02/2023, 2:23 PM

## 2023-03-02 NOTE — Consult Note (Signed)
Pharmacy Consult Note - Anticoagulation  Pharmacy Consult for heparin Indication:  limb ischemia  PATIENT MEASUREMENTS: Height: 5\' 3"  (160 cm) Weight: 81.6 kg (179 lb 14.3 oz) IBW/kg (Calculated) : 56.9 HEPARIN DW (KG): 74.3  VITAL SIGNS: Temp: 98.5 F (36.9 C) (11/10 0424) BP: 155/78 (11/10 0424) Pulse Rate: 81 (11/10 0424)  Recent Labs    02/28/23 1430 03/01/23 0218 03/01/23 0728 03/02/23 0713  HGB  --  15.0  --   --   HCT  --  44.0  --   --   PLT  --  186  --   --   APTT >200*  --   --   --   LABPROT 14.3  --   --   --   INR 1.1  --   --   --   HEPARINUNFRC  --  0.68   < > 0.53  CREATININE  --  1.36*  --  1.13   < > = values in this interval not displayed.    Estimated Creatinine Clearance: 63.2 mL/min (by C-G formula based on SCr of 1.13 mg/dL).  PAST MEDICAL HISTORY: Past Medical History:  Diagnosis Date   Arthritis    Depression    GERD (gastroesophageal reflux disease)    H/O ulcer disease    Headache    MIGRAINES   Hypertension    SBO (small bowel obstruction) (HCC)    Wears dentures    full upper    ASSESSMENT: 62 y.o. male with PMH including PVD is presenting with limb ischemia. Recent ABIs shows monophasic flow to right leg. Pt at high risk of amputation unless blood flow can be restored. Patient is not on chronic anticoagulation per chart review. Pharmacy has been consulted to initiate and manage heparin intravenous infusion.  Pertinent medications: No chronic anticoagulation PTA per chart review  Goal(s) of therapy: Heparin level 0.3 - 0.7 units/mL Monitor platelets by anticoagulation protocol: Yes   Baseline anticoagulation labs: Recent Labs    02/28/23 1300 02/28/23 1430 03/01/23 0218  APTT  --  >200*  --   INR  --  1.1  --   HGB 14.8  --  15.0  PLT 197  --  186    Date Time aPTT/HL Rate/Comment 11/9     0218      0.68               therapeutic X 1  11/9 0728  0.62  Therapeutic x 2 11/10 0713   0.53  Therapeutic  x3   PLAN: 11/10 0713   0.53 Therapeutic x3 - Will continue pt on current rate of 1250 units/hr and check next HL with am labs Monitor CBC daily while on heparin infusion.   Bari Mantis PharmD Clinical Pharmacist 03/02/2023

## 2023-03-02 NOTE — Progress Notes (Signed)
[facility] Progress Note  Patient: Alan Trevino    ZOX:[id number]  DOA: 02/28/2023     Date of Service: the patient was seen and examined on 03/02/2023  Chief Complaint  Patient presents with   Circulatory Problem   Brief hospital course: Alan Trevino is a 62 y.o. male with medical history significant of hypertension, hyperlipidemia, GERD, history of GI bleeding presenting with acute lower limb ischemia.  Patient reports worsening great toe pain and discoloration for the past 4 to 6 weeks.  Noted to have been seen in the ER October 11 was concern for great toe discoloration.  Was recommended to have podiatry follow-up.  Was seen by podiatry yesterday with noted worsening discoloration of the great toe as well as fifth toe.  Noted worsening pain.  Patient denies any known history of symptoms like this in the past.  Non-smoker.  No reported alcohol use.  No fevers or chills.  No prior history of DVT.  No remote history of GI bleeding in the past associated with NSAID use.  Patient denies any known trauma to the area. Presented to the ER afebrile, hemodynamically stable.  Satting well on room air.  White count 9.1, hemoglobin 14.8, platelets 197.  Creatinine 1.22, glucose 160. R great toe plain films obtained.  Vascular surgery consulted.  Started on heparin drip.  Tentative plan for angiogram.   Assessment and Plan:  # Acute right lower limb ischemia,s/p angioplasty and stent insertion done on 11/8 Worsening right foot and toe pain over the past 4 to 6 weeks Previously evaluated by podiatry with concern for limb ischemia-redirected to the ER for further evaluation Noted decreased ABIs on the right Vascular surgery consulted, s/p angioplasty and stent insertion in the right lower extremity.  Pain and tenderness is gradually improving. S/p heparin IV infusion, discontinued on 11/10, patient was on Plavix, added aspirin 81 mg as well. Started pantoprazole 40 mg p.o. twice daily due to  history of GI bleeding Continue as needed medication for pain control Follow-up vascular surgery tomorrow a.m. for discharge planning   History of GI bleed Noted prior history of NSAID induced GI bleeding IV PPI in the setting of heparin drip Hemoglobin 15 stable  Trend Monitor for any black stools with downtrending hemoglobin Follow   HLD (hyperlipidemia) Continue statin   Essential (primary) hypertension Continue amlodipine 10 mg and benazepril 20 mg p.o. daily Monitor BP and titrate home regimen     Body mass index is 31.87 kg/m.  Interventions:  Diet: Heart healthy diet DVT Prophylaxis: Therapeutic Anticoagulation with heparin IV infusion    Advance goals of care discussion: Full code  Family Communication: family was not present at bedside, at the time of interview.  The pt provided permission to discuss medical plan with the family. Opportunity was given to ask question and all questions were answered satisfactorily.   Disposition:  Pt is from home, admitted with ischemia from right toes, still on IV heparin infusion, needs angioplasty, which precludes a safe discharge. Discharge to home, when stable and cleared by vascular surgery.  Subjective: No significant events overnight, patient feels improvement in the right foot pain but still it is hurting more when he is laying flat in the bed, feels improvement when he is keeping his foot down.  Denies any chest pain or palpitation, no shortness of breath, no any other active issues.   Physical Exam: General: NAD, lying comfortably Appear in no distress, affect appropriate Eyes: PERRLA ENT: Oral Mucosa Clear,  moist  Neck: no JVD,  Cardiovascular: S1 and S2 Present, no Murmur,  Respiratory: good respiratory effort, Bilateral Air entry equal and Decreased, no Crackles, no wheezes Abdomen: Bowel Sound present, Soft and no tenderness,  Skin: no rashes Extremities: no Pedal edema, no calf tenderness, right foot first,  third and fourth toe erythema and tenderness, gradually improving. Neurologic: without any new focal findings Patient is ambulating well in the room.  Vitals:   03/01/23 1635 03/01/23 1921 03/02/23 0424 03/02/23 0836  BP: 129/67 129/78 (!) 155/78 134/69  Pulse: 68 69 81 72  Resp: 16 18 18 16   Temp: 98.1 F (36.7 C) 98.1 F (36.7 C) 98.5 F (36.9 C) 98.3 F (36.8 C)  TempSrc:  Oral  Oral  SpO2: 95% 93% 94% 91%  Weight:      Height:        Intake/Output Summary (Last 24 hours) at 03/02/2023 1246 Last data filed at 03/02/2023 0429 Gross per 24 hour  Intake 308.99 ml  Output 1600 ml  Net -1291.01 ml   Filed Weights   02/28/23 1254 02/28/23 1610  Weight: 81.6 kg 81.6 kg    Data Reviewed: I have personally reviewed and interpreted daily labs, tele strips, imagings as discussed above. I reviewed all nursing notes, pharmacy notes, vitals, pertinent old records I have discussed plan of care as described above with RN and patient/family.  CBC: Recent Labs  Lab 02/28/23 1300 03/01/23 0218 03/02/23 0713  WBC 9.1 8.7 10.1  HGB 14.8 15.0 15.0  HCT 43.7 44.0 43.3  MCV 96.5 96.3 92.9  PLT 197 186 205   Basic Metabolic Panel: Recent Labs  Lab 02/28/23 1300 03/01/23 0218 03/02/23 0713  NA 135 136 137  K 3.7 4.3 4.4  CL 101 103 104  CO2 23 23 23   GLUCOSE 160* 96 114*  BUN 13 14 12   CREATININE 1.22 1.36* 1.13  CALCIUM 8.9 8.7* 9.5  MG  --   --  2.2  PHOS  --   --  3.6    Studies: No results found.  Scheduled Meds:  amLODipine  10 mg Oral Daily   And   benazepril  20 mg Oral Daily   aspirin  81 mg Oral Daily   [START ON 03/03/2023] atorvastatin  40 mg Oral QHS   bisacodyl  10 mg Oral QHS   buPROPion  300 mg Oral Daily   busPIRone  30 mg Oral BID   clopidogrel  75 mg Oral Q breakfast   methylphenidate  20 mg Oral BID WC   pantoprazole  40 mg Oral BID   polyethylene glycol  17 g Oral BID   Continuous Infusions:   PRN Meds: acetaminophen, bisacodyl,  methylphenidate, morphine injection, ondansetron **OR** ondansetron (ZOFRAN) IV, sodium chloride flush  Time spent: 40 minutes  Author: Gillis Trevino. MD [facility] 03/02/2023 12:46 PM  To reach On-call, see care teams to locate the attending and reach out to them via [email or web address]. If 7PM-7AM, please contact night-coverage If you still have difficulty reaching the attending provider, please page the [facility] (Director on Call) for [facility] on amion for assistance.

## 2023-03-02 NOTE — TOC CM/SW Note (Signed)
Transition of Care [facility]) - Inpatient Brief Assessment   Patient Details  Name: Alan Trevino MRN: [id number] Date of Birth: 1960/06/23  Transition of Care [facility]) CM/SW Contact:    Liliana Cline, LCSW Phone Number: 03/02/2023, 8:59 AM   Clinical Narrative: Please consult TOC if needs arise.   Transition of Care Asessment: Insurance and Status: Insurance coverage has been reviewed Patient has primary care physician: Yes     Prior/Current Home Services: No current home services Social Determinants of Health Reivew: SDOH reviewed no interventions necessary Readmission risk has been reviewed: Yes Transition of care needs: no transition of care needs at this time

## 2023-03-03 ENCOUNTER — Telehealth (INDEPENDENT_AMBULATORY_CARE_PROVIDER_SITE_OTHER): Payer: Self-pay

## 2023-03-03 ENCOUNTER — Encounter: Payer: Self-pay | Admitting: Vascular Surgery

## 2023-03-03 ENCOUNTER — Other Ambulatory Visit (INDEPENDENT_AMBULATORY_CARE_PROVIDER_SITE_OTHER): Payer: Self-pay | Admitting: Vascular Surgery

## 2023-03-03 DIAGNOSIS — I739 Peripheral vascular disease, unspecified: Secondary | ICD-10-CM

## 2023-03-03 DIAGNOSIS — I70221 Atherosclerosis of native arteries of extremities with rest pain, right leg: Secondary | ICD-10-CM | POA: Diagnosis not present

## 2023-03-03 LAB — BASIC METABOLIC PANEL
Anion gap: 7 (ref 5–15)
BUN: 13 mg/dL (ref 8–23)
CO2: 25 mmol/L (ref 22–32)
Calcium: 9.2 mg/dL (ref 8.9–10.3)
Chloride: 105 mmol/L (ref 98–111)
Creatinine, Ser: 1.01 mg/dL (ref 0.61–1.24)
GFR, Estimated: >60 mL/min (ref >60)
Glucose, Bld: 104 mg/dL — ABNORMAL HIGH (ref 70–99)
Potassium: 4.2 mmol/L (ref 3.5–5.1)
Sodium: 137 mmol/L (ref 135–145)

## 2023-03-03 LAB — CBC
HCT: 40.8 % (ref 39.0–52.0)
Hemoglobin: 14.1 g/dL (ref 13.0–17.0)
MCH: 32.2 pg (ref 26.0–34.0)
MCHC: 34.6 g/dL (ref 30.0–36.0)
MCV: 93.2 fL (ref 80.0–100.0)
Platelets: 197 10*3/uL (ref 150–400)
RBC: 4.38 MIL/uL (ref 4.22–5.81)
RDW: 11.6 % (ref 11.5–15.5)
WBC: 8.8 10*3/uL (ref 4.0–10.5)
nRBC: 0 % (ref 0.0–0.2)

## 2023-03-03 LAB — PHOSPHORUS: Phosphorus: 4.5 mg/dL (ref 2.5–4.6)

## 2023-03-03 LAB — MAGNESIUM: Magnesium: 2.2 mg/dL (ref 1.7–2.4)

## 2023-03-03 MED ORDER — ASPIRIN 81 MG PO CHEW: 81.0000 mg | CHEWABLE_TABLET | Freq: Every day | ORAL | 11 refills | Status: AC

## 2023-03-03 MED ORDER — BISACODYL 5 MG PO TBEC: 10.0000 mg | DELAYED_RELEASE_TABLET | Freq: Every day | ORAL | 0 refills | Status: DC

## 2023-03-03 MED ORDER — CLOPIDOGREL BISULFATE 75 MG PO TABS: 75.0000 mg | ORAL_TABLET | Freq: Every day | ORAL | 11 refills | Status: AC

## 2023-03-03 MED ORDER — ATORVASTATIN CALCIUM 40 MG PO TABS: 40.0000 mg | ORAL_TABLET | Freq: Every day | ORAL | 11 refills | Status: DC

## 2023-03-03 MED ORDER — POLYETHYLENE GLYCOL 3350 17 G PO PACK: 17.0000 g | PACK | Freq: Two times a day (BID) | ORAL | 0 refills | Status: DC

## 2023-03-03 MED ORDER — PANTOPRAZOLE SODIUM 40 MG PO TBEC: 40.0000 mg | DELAYED_RELEASE_TABLET | Freq: Two times a day (BID) | ORAL | 11 refills | Status: DC

## 2023-03-03 NOTE — Progress Notes (Signed)
Progress Note    03/03/2023 11:02 AM 3 Days Post-Op  Subjective:  Alan Trevino is a 62 yo male who is now POD #3 from right lower extremity angiogram for ischemic painful toes.  Patient endorses that his right great toe hurts but it is improved.  No ulceration today just discoloration but improved from Friday.  Patient's toes do not appear to be as dark purple as they were.  Patient endorses resting pain as well as walking pain.  Patient did have warm right lower extremity to touch and palpable DP pulses.   Vitals:   03/03/23 0335 03/03/23 0930  BP: 126/71 (!) 151/72  Pulse: 61 65  Resp: 18 18  Temp: 97.7 F (36.5 C) (!) 97.5 F (36.4 C)  SpO2: 92% 97%   Physical Exam: Cardiac:  RRR, Normal S1,S2. No murmur Lungs: Normal respiratory effort, lung sounds clear on auscultation but decreased in the bases.  No rales rhonchi or wheezing noted. Incisions: Left groin incision clean dry intact with Band-Aid in place. Extremities: Left lower extremity warm to touch with palpable pulses.  Right lower extremity warm to touch with erythemic right great toe fifth toe.  Painful to touch. Abdomen: Positive bowel sounds throughout, soft, nontender and nondistended. Neurologic: Alert and oriented to time person and place.  Answers all questions and follows commands appropriately.  CBC    Component Value Date/Time   WBC 8.8 03/03/2023 0414   RBC 4.38 03/03/2023 0414   HGB 14.1 03/03/2023 0414   HGB 14.8 11/16/2021 1041   HCT 40.8 03/03/2023 0414   HCT 44.4 11/16/2021 1041   PLT 197 03/03/2023 0414   PLT 220 11/16/2021 1041   MCV 93.2 03/03/2023 0414   MCV 95 11/16/2021 1041   MCV 94 04/16/2013 1607   MCH 32.2 03/03/2023 0414   MCHC 34.6 03/03/2023 0414   RDW 11.6 03/03/2023 0414   RDW 12.3 11/16/2021 1041   RDW 12.5 04/16/2013 1607   LYMPHSABS 1.6 11/16/2021 1041   LYMPHSABS 1.4 04/16/2013 1607   MONOABS 0.9 12/12/2020 1450   MONOABS 0.7 04/16/2013 1607   EOSABS 0.2 11/16/2021  1041   EOSABS 0.1 04/16/2013 1607   BASOSABS 0.1 11/16/2021 1041   BASOSABS 0.0 04/16/2013 1607    BMET    Component Value Date/Time   NA 137 03/03/2023 0414   NA 141 11/15/2022 1436   NA 139 04/16/2013 1607   K 4.2 03/03/2023 0414   K 4.0 04/16/2013 1607   CL 105 03/03/2023 0414   CL 97 (L) 04/16/2013 1607   CO2 25 03/03/2023 0414   CO2 31 04/16/2013 1607   GLUCOSE 104 (H) 03/03/2023 0414   GLUCOSE 106 (H) 04/16/2013 1607   BUN 13 03/03/2023 0414   BUN 13 11/15/2022 1436   BUN 10 04/16/2013 1607   CREATININE 1.01 03/03/2023 0414   CREATININE 1.12 04/16/2013 1607   CALCIUM 9.2 03/03/2023 0414   CALCIUM 9.6 04/16/2013 1607   GFRNONAA >60 03/03/2023 0414   GFRNONAA >60 04/16/2013 1607   GFRAA 69 03/09/2020 1423   GFRAA >60 04/16/2013 1607    INR    Component Value Date/Time   INR 1.1 02/28/2023 1430     Intake/Output Summary (Last 24 hours) at 03/03/2023 1102 Last data filed at 03/02/2023 2006 Gross per 24 hour  Intake 240 ml  Output --  Net 240 ml     Assessment/Plan:  62 y.o. male is s/p right lower extremity angiogram with angioplasty and stent placement to the right  SFA for ischemic right toes.  3 Days Post-Op   Per vascular surgery patient is recovering as expected.  Patient endorses having painful right great toe.  Erythema and ischemia appear to be improving.  Okay to discharge as soon as medically stable.  Patient needs to remain on aspirin and Plavix due to stent placement.  DVT prophylaxis: ASA 81 mg daily, Plavix 75 mg daily.   Marcie [facility] 03/03/2023 11:02 AM

## 2023-03-03 NOTE — Telephone Encounter (Signed)
[name] called stating the patient was told that he can go back to work Monday 03/03/23 after his angio on Friday, [name] the patient was told he could go back to work in a week. If they are pushing it we can try to get him on the schedule for a follow up and a ABI this Thursday 03/06/23.  Orignial follow up was in 2 weeks.   I called [name] and left a voicemail on both.   Please schedule Alan Trevino for a Follow up with a ABI with Schnier on 03/06/23  PER SCHNEIR

## 2023-03-03 NOTE — Plan of Care (Signed)
Adequate for discharge  Alan Trevino [name]  

## 2023-03-03 NOTE — Discharge Summary (Signed)
[facility] Discharge Summary   Patient: Alan Trevino ACZ:[id number]  PCP: Duanne Limerick, MD  Date of admission: 02/28/2023   Date of discharge:  03/03/2023     Discharge Diagnoses:  Principal Problem:   Critical limb ischemia of right lower extremity (HCC) Active Problems:   Acute lower limb ischemia   History of GI bleed   Essential (primary) hypertension   HLD (hyperlipidemia)   Upper GI bleed   Admitted From: Home Disposition:  Home   Recommendations for Outpatient Follow-up:  Follow-up with PCP in 1 week Follow-up with vascular surgery in 1 week.  Patient will need clearance to go back to work by vascular surgery. Follow up LABS/TEST:     Follow-up Information     Schnier, Latina Craver, MD Follow up in 2 week(s).   Specialties: Vascular Surgery, Cardiology, Radiology, Vascular Surgery Why: follow up after procedure with abi Contact information: [address] [facility] [address] [place] [address] [phone]         Duanne Limerick, MD Follow up in 1 week(s).   Specialty: Family Medicine Contact information: [address] [place] [address] [phone]                Diet recommendation: Cardiac diet  Activity: The patient is advised to gradually reintroduce usual activities, as tolerated  Discharge Condition: stable  Code Status: Full code   History of present illness: As per the H and P dictated on admission Hospital Course:  Alan Trevino is a 62 y.o. male with medical history significant of hypertension, hyperlipidemia, GERD, history of GI bleeding presenting with acute lower limb ischemia.  Patient reports worsening great toe pain and discoloration for the past 4 to 6 weeks.  Noted to have been seen in the ER October 11 was concern for great toe discoloration.  Was recommended to have podiatry follow-up.  Was seen by podiatry yesterday with noted worsening discoloration of the great toe as well as fifth toe.  Noted  worsening pain.  Patient denies any known history of symptoms like this in the past.  Non-smoker.  No reported alcohol use.  No fevers or chills.  No prior history of DVT.  No remote history of GI bleeding in the past associated with NSAID use.  Patient denies any known trauma to the area. Presented to the ER afebrile, hemodynamically stable.  Satting well on room air.  White count 9.1, hemoglobin 14.8, platelets 197.  Creatinine 1.22, glucose 160. R great toe plain films obtained.  Vascular surgery consulted.  Started on heparin drip.  Tentative plan for angiogram.     Assessment and Plan:   # Acute right lower limb ischemia, s/p angioplasty and Right SFA stent insertion done on 11/8 Pt presented with worsening right foot and toe pain over the past 4 to 6 weeks. Previously evaluated by podiatry with concern for limb ischemia-redirected to the ER for further evaluation. Noted decreased ABIs on the right. Vascular surgery consulted, s/p angioplasty and stent insertion in the Right SFA, right lower extremity.  Pain and tenderness is gradually improving. S/p heparin IV infusion, discontinued on 11/10, patient was on Plavix, added aspirin 81 mg as well. Started pantoprazole 40 mg p.o. twice daily due to history of GI bleeding. Continue as needed medication for pain control.  Vascular surgery cleared for discharge to follow-up as an outpatient.  Patient remained stable, agreed for discharge planning.  # History of GI bleed: Noted prior history of NSAID  induced GI bleeding S/p IV PPI in the setting of heparin drip, Hb remained stable.  No active bleeding noticed during hospital stay.  Patient was advised to continue dual antiplatelet therapy and pantoprazole twice daily. Watch for bowel movements for any bleeding or dark stools, return back to ED if there is any coffee-ground emesis. # HLD (hyperlipidemia) Continue statin # Essential (primary) hypertension: Continue amlodipine 10 mg and benazepril 20 mg  p.o. daily  Body mass index is 31.87 kg/m.  Nutrition Interventions:  - Patient was instructed, not to drive, operate heavy machinery, perform activities at heights, swimming or participation in water activities or provide baby sitting services while on Pain, Sleep and Anxiety Medications; until his outpatient Physician has advised to do so again.  - Also recommended to not to take more than prescribed Pain, Sleep and Anxiety Medications.  Patient was ambulatory without any assistance. On the day of the discharge the patient's vitals were stable, and no other acute medical condition were reported by patient. the patient was felt safe to be discharge at Home.  Consultants: Vascular surgery Procedures: s/p angioplasty and right SFA stent insertion done on 02/28/2023  Discharge Exam: General: Appear in no distress, no Rash; Oral Mucosa Clear, moist. Cardiovascular: S1 and S2 Present, no Murmur, Respiratory: normal respiratory effort, Bilateral Air entry present and no Crackles, no wheezes Abdomen: Bowel Sound present, Soft and no tenderness, no hernia Extremities: no Pedal edema, no calf tenderness.  Right great toe and fifth toe erythematous and tender due to ischemia, fourth toe without any tenderness, overall seems improving with the reperfusion. Neurology: alert and oriented to time, place, and person affect appropriate.  Filed Weights   02/28/23 1254 02/28/23 1610  Weight: 81.6 kg 81.6 kg   Vitals:   03/03/23 0335 03/03/23 0930  BP: 126/71 (!) 151/72  Pulse: 61 65  Resp: 18 18  Temp: 97.7 F (36.5 C) (!) 97.5 F (36.4 C)  SpO2: 92% 97%    DISCHARGE MEDICATION: Allergies as of 03/03/2023       Reactions   Nsaids    Other Reaction(s): gi bleed        Medication List     TAKE these medications    acetaminophen 500 MG tablet Commonly known as: TYLENOL Take 1,000 mg by mouth every 6 (six) hours as needed for moderate pain.   amLODipine-benazepril 10-20 MG  capsule Commonly known as: LOTREL Take 1 capsule by mouth daily.   aspirin 81 MG chewable tablet Chew 1 tablet (81 mg total) by mouth daily. Start taking on: March 04, 2023   atorvastatin 40 MG tablet Commonly known as: LIPITOR Take 1 tablet (40 mg total) by mouth daily. TAKE 1 TABLET BY MOUTH EVERY DAY What changed:  medication strength how much to take   bisacodyl 5 MG EC tablet Commonly known as: DULCOLAX Take 2 tablets (10 mg total) by mouth at bedtime.   buPROPion 300 MG 24 hr tablet Commonly known as: WELLBUTRIN XL Take 300 mg by mouth daily.   busPIRone 30 MG tablet Commonly known as: BUSPAR Take 30 mg by mouth 2 (two) times daily.   clopidogrel 75 MG tablet Commonly known as: PLAVIX Take 1 tablet (75 mg total) by mouth daily with breakfast. Start taking on: March 04, 2023   cyclobenzaprine 5 MG tablet Commonly known as: FLEXERIL Take 1 tablet (5 mg total) by mouth at bedtime.   methylphenidate 20 MG tablet Commonly known as: RITALIN Take 10-20 mg by mouth 3 (three)  times daily with meals.   oxyCODONE-acetaminophen 5-325 MG tablet Commonly known as: Percocet Take 1 tablet by mouth every 4 (four) hours as needed for severe pain (pain score 7-10). What changed: Another medication with the same name was removed. Continue taking this medication, and follow the directions you see here.   pantoprazole 40 MG tablet Commonly known as: PROTONIX Take 1 tablet (40 mg total) by mouth 2 (two) times daily. Decrease to daily dosing after 30 days   polyethylene glycol 17 g packet Commonly known as: MIRALAX / GLYCOLAX Take 17 g by mouth 2 (two) times daily.       Allergies  Allergen Reactions   Nsaids     Other Reaction(s): gi bleed   Discharge Instructions     Call MD for:  difficulty breathing, headache or visual disturbances   Complete by: As directed    Call MD for:  extreme fatigue   Complete by: As directed    Call MD for:  persistant dizziness or  light-headedness   Complete by: As directed    Call MD for:  persistant nausea and vomiting   Complete by: As directed    Call MD for:  severe uncontrolled pain   Complete by: As directed    Call MD for:  temperature >100.4   Complete by: As directed    Diet - low sodium heart healthy   Complete by: As directed    Discharge instructions   Complete by: As directed    Follow-up with PCP in 1 week Follow-up with vascular surgery in 1 week.  Patient will need clearance to go back to work by vascular surgery.   Increase activity slowly   Complete by: As directed        The results of significant diagnostics from this hospitalization (including imaging, microbiology, ancillary and laboratory) are listed below for reference.    Significant Diagnostic Studies: PERIPHERAL VASCULAR CATHETERIZATION  Result Date: 02/28/2023 See surgical note for result.  DG Toe Great Right  Result Date: 02/28/2023 CLINICAL DATA:  Pain and swelling in the great toe, discoloration EXAM: RIGHT GREAT TOE COMPARISON:  01/31/2023 FINDINGS: Stable appearance of the lucency with thin rim along the medial base of the proximal phalanx great toe, probably a geode or degenerative subcortical cystic lesion. No overlying cortical destructive findings to suggest osteomyelitis. There is mild spurring at the first MTP joint. The interphalangeal joint appears unremarkable. No significant gas tracking in the soft tissues. No foreign body noted. IMPRESSION: 1. No radiographic findings of osteomyelitis. 2. Mild spurring at the first MTP joint. 3. Stable appearance of the lucency with thin rim along the medial base of the proximal phalanx great toe, probably a geode or degenerative subcortical cystic lesion. Electronically Signed   By: Gaylyn Rong M.D.   On: 02/28/2023 16:51   VAS [place] ABI WITH/WO TBI  Result Date: 02/27/2023  LOWER EXTREMITY DOPPLER STUDY Patient Name:  PRIMITIVO NIKODEM  Date of Exam:   02/27/2023 Medical Rec #:  [id number]        Accession #:    [id number] Date of Birth: [birth year]        Patient Gender: M Patient Age:   66 years Exam Location:  [facility] Procedure:      VAS [place] ABI WITH/WO TBI Referring Phys: Caryn Bee PATEL --------------------------------------------------------------------------------  Indications: Claudication, and peripheral artery disease. High Risk Factors: Hypertension, hyperlipidemia, past history of smoking.  Performing Technologist: Hardie Lora RVT  Examination Guidelines: A complete  evaluation includes at minimum, Doppler waveform signals and systolic blood pressure reading at the level of bilateral brachial, anterior tibial, and posterior tibial arteries, when vessel segments are accessible. Bilateral testing is considered an integral part of a complete examination. Photoelectric Plethysmograph (PPG) waveforms and toe systolic pressure readings are included as required and additional duplex testing as needed. Limited examinations for reoccurring indications may be performed as noted.  ABI Findings: +---------+------------------+-----+----------+--------+ Right    Rt Pressure (mmHg)IndexWaveform  Comment  +---------+------------------+-----+----------+--------+ Brachial 129                                       +---------+------------------+-----+----------+--------+ PTA      109               0.81 monophasic         +---------+------------------+-----+----------+--------+ DP       103               0.77 monophasic         +---------+------------------+-----+----------+--------+ Great Toe77                0.57                    +---------+------------------+-----+----------+--------+ +---------+------------------+-----+---------+-------+ Left     Lt Pressure (mmHg)IndexWaveform Comment +---------+------------------+-----+---------+-------+ Brachial 134                                     +---------+------------------+-----+---------+-------+  PTA      155               1.16 triphasic        +---------+------------------+-----+---------+-------+ DP       135               1.01 triphasic        +---------+------------------+-----+---------+-------+ Kerby Nora               0.87                  +---------+------------------+-----+---------+-------+ +-------+-----------+-----------+------------+------------+ ABI/TBIToday's ABIToday's TBIPrevious ABIPrevious TBI +-------+-----------+-----------+------------+------------+ Right  0.81       0.57                                +-------+-----------+-----------+------------+------------+ Left   1.16       0.87                                +-------+-----------+-----------+------------+------------+   Summary: Right: Resting right ankle-brachial index indicates mild right lower extremity arterial disease. The right toe-brachial index is abnormal. Limited imaging showed >50% mid right SFA stenosis. Left: Resting left ankle-brachial index is within normal range. The left toe-brachial index is normal. *See table(s) above for measurements and observations.  Electronically signed by Levora Dredge MD on 02/27/2023 at 6:01:54 PM.    Final     Microbiology: No results found for this or any previous visit (from the past 240 hour(s)).   Labs: CBC: Recent Labs  Lab 02/28/23 1300 03/01/23 0218 03/02/23 0713 03/03/23 0414  WBC 9.1 8.7 10.1 8.8  HGB 14.8 15.0 15.0 14.1  HCT 43.7 44.0 43.3 40.8  MCV 96.5 96.3 92.9 93.2  PLT 197 186 205  197   Basic Metabolic Panel: Recent Labs  Lab 02/28/23 1300 03/01/23 0218 03/02/23 0713 03/03/23 0414  NA 135 136 137 137  K 3.7 4.3 4.4 4.2  CL 101 103 104 105  CO2 23 23 23 25   GLUCOSE 160* 96 114* 104*  BUN 13 14 12 13   CREATININE 1.22 1.36* 1.13 1.01  CALCIUM 8.9 8.7* 9.5 9.2  MG  --   --  2.2 2.2  PHOS  --   --  3.6 4.5   Liver Function Tests: Recent Labs  Lab 03/01/23 0218  AST 36  ALT 34  ALKPHOS 58  BILITOT  0.9  PROT 7.4  ALBUMIN 4.2   No results for input(s): "LIPASE", "AMYLASE" in the last 168 hours. No results for input(s): "AMMONIA" in the last 168 hours. Cardiac Enzymes: No results for input(s): "CKTOTAL", "CKMB", "CKMBINDEX", "TROPONINI" in the last 168 hours. BNP (last 3 results) No results for input(s): "BNP" in the last 8760 hours. CBG: No results for input(s): "GLUCAP" in the last 168 hours.  Time spent: 35 minutes  Signed:  Gillis Santa  [facility] 03/03/2023 12:04 PM

## 2023-03-04 ENCOUNTER — Telehealth: Payer: Self-pay

## 2023-03-04 NOTE — Transitions of Care (Post Inpatient/ED Visit) (Unsigned)
03/04/2023  Name: Alan Trevino MRN: [id number] DOB: 01/18/1961  Today's TOC FU Call Status: Today's TOC FU Call Status:: Successful TOC FU Call Completed TOC FU Call Complete Date: 03/04/23 Patient's Name and Date of Birth confirmed.  Transition Care Management Follow-up Telephone Call Date of Discharge: 03/03/23 Discharge Facility: [facility]) Type of Discharge: Inpatient Admission Primary Inpatient Discharge Diagnosis:: Critical Limb Ischemia of Right Lower Extremity How have you been since you were released from the hospital?: Same Any questions or concerns?: No  Items Reviewed: Did you receive and understand the discharge instructions provided?: Yes Medications obtained,verified, and reconciled?: Yes (Medications Reviewed) Any new allergies since your discharge?: No Dietary orders reviewed?: NA Do you have support at home?: Yes People in Home: significant other  Medications Reviewed Today: Medications Reviewed Today     Reviewed by Anthoney Harada, LPN (Licensed Practical Nurse) on 03/04/23 at 1158  Med List Status: <None>   Medication Order Taking? Sig Documenting Provider Last Dose Status Informant  acetaminophen (TYLENOL) 500 MG tablet [id number] Yes Take 1,000 mg by mouth every 6 (six) hours as needed for moderate pain.  [provider] Taking Active Spouse/Significant Other  amLODipine-benazepril (LOTREL) 10-20 MG capsule [id number] Yes Take 1 capsule by mouth daily. Duanne Limerick, MD Taking Active Spouse/Significant Other  aspirin 81 MG chewable tablet [id number] Yes Chew 1 tablet (81 mg total) by mouth daily. Gillis Santa, MD Taking Active   atorvastatin (LIPITOR) 40 MG tablet [id number] Yes Take 1 tablet (40 mg total) by mouth daily. TAKE 1 TABLET BY MOUTH EVERY DAY Gillis Santa, MD Taking Active   bisacodyl (DULCOLAX) 5 MG EC tablet [id number] Yes Take 2 tablets (10 mg total) by mouth at bedtime. Gillis Santa, MD Taking  Active   buPROPion (WELLBUTRIN XL) 300 MG 24 hr tablet [id number] Yes Take 300 mg by mouth daily.  [provider] Taking Active Spouse/Significant Other           Med Note Deretha Emory, [facility]   Thu Nov 28, 2016  1:45 PM)    busPIRone (BUSPAR) 30 MG tablet [id number] Yes Take 30 mg by mouth 2 (two) times daily.  [provider] Taking Active Spouse/Significant Other           Med Note Deretha Emory, [facility]   Thu Nov 28, 2016  1:45 PM)    clopidogrel (PLAVIX) 75 MG tablet [id number] Yes Take 1 tablet (75 mg total) by mouth daily with breakfast. Gillis Santa, MD Taking Active   cyclobenzaprine (FLEXERIL) 5 MG tablet [id number] Yes Take 1 tablet (5 mg total) by mouth at bedtime. Duanne Limerick, MD Taking Active Spouse/Significant Other           Med Note Sharia Reeve   Fri Feb 28, 2023  3:39 PM) Patient only takes as needed  methylphenidate (RITALIN) 20 MG tablet [id number] Yes Take 10-20 mg by mouth 3 (three) times daily with meals. [provider] Taking Active Spouse/Significant Other           Med Note Sharia Reeve   Fri Feb 28, 2023  3:40 PM) Patient takes 20 mg every morning with breakfast, 20 mg with lunch and 10 mg in the afternoon, as needed  oxyCODONE-acetaminophen (PERCOCET) 5-325 MG tablet [id number] Yes Take 1 tablet by mouth every 4 (four) hours as needed for severe pain (pain score 7-10). Candelaria Stagers, DPM Taking Active Spouse/Significant Other           Med  Note Sharia Reeve   Fri Feb 28, 2023  3:39 PM) New medication, not started yet  pantoprazole (PROTONIX) 40 MG tablet [id number] Yes Take 1 tablet (40 mg total) by mouth 2 (two) times daily. Decrease to daily dosing after 30 days Gillis Santa, MD Taking Active   polyethylene glycol (MIRALAX / GLYCOLAX) 17 g packet [id number] Yes Take 17 g by mouth 2 (two) times daily. Gillis Santa, MD Taking Active             Home Care and Equipment/Supplies: Were Home Health Services Ordered?:  No Any new equipment or medical supplies ordered?: No  Functional Questionnaire: Do you need assistance with bathing/showering or dressing?: No Do you need assistance with meal preparation?: No Do you need assistance with eating?: No Do you have difficulty maintaining continence: No Do you need assistance with getting out of bed/getting out of a chair/moving?: No Do you have difficulty managing or taking your medications?: No  Follow up appointments reviewed: PCP Follow-up appointment confirmed?: Yes Date of PCP follow-up appointment?: 03/07/23 Follow-up Provider: Dr. Elizabeth Sauer Specialist [facility] Follow-up appointment confirmed?: Yes Date of Specialist follow-up appointment?: 03/06/23 Follow-Up Specialty Provider:: Vascular-Dr. Levora Dredge Do you need transportation to your follow-up appointment?: No Do you understand care options if your condition(s) worsen?: Yes-patient verbalized understanding    SIGNATURE Kandis Fantasia, LPN [facility] Health Advisor [place] [facility] You Are. We Are. [facility] Direct Dial [phone]

## 2023-03-04 NOTE — Transitions of Care (Post Inpatient/ED Visit) (Signed)
03/04/2023  Name: Alan Trevino MRN: [id number] DOB: 08-23-1960  Today's TOC FU Call Status: Today's TOC FU Call Status:: Successful TOC FU Call Completed TOC FU Call Complete Date: 03/04/23 Patient's Name and Date of Birth confirmed.  Transition Care Management Follow-up Telephone Call Date of Discharge: 03/03/23 Discharge Facility: [facility]) Type of Discharge: Inpatient Admission Primary Inpatient Discharge Diagnosis:: Critical limb ischemia of right lower extremity s/p SFA Stent How have you been since you were released from the hospital?: Better Any questions or concerns?: No  Items Reviewed: Did you receive and understand the discharge instructions provided?: Yes Medications obtained,verified, and reconciled?: Yes (Medications Reviewed) Any new allergies since your discharge?: No Dietary orders reviewed?: Yes Type of Diet Ordered:: Reg, NAS Heart Healthy Do you have support at home?: Yes People in Home: significant other Name of Support/Comfort Primary Source: [name]  Medications Reviewed Today: Medications Reviewed Today     Reviewed by Johnnette Barrios, RN (Registered Nurse) on 03/04/23 at 1230  Med List Status: <None>   Medication Order Taking? Sig Documenting Provider Last Dose Status Informant  acetaminophen (TYLENOL) 500 MG tablet [id number] Yes Take 1,000 mg by mouth every 6 (six) hours as needed for moderate pain.  [provider] Taking Active Spouse/Significant Other  amLODipine-benazepril (LOTREL) 10-20 MG capsule [id number]  Take 1 capsule by mouth daily. Duanne Limerick, MD  Active Spouse/Significant Other  aspirin 81 MG chewable tablet [id number] Yes Chew 1 tablet (81 mg total) by mouth daily. Gillis Santa, MD Taking Active   atorvastatin (LIPITOR) 40 MG tablet [id number] Yes Take 1 tablet (40 mg total) by mouth daily. TAKE 1 TABLET BY MOUTH EVERY DAY Gillis Santa, MD Taking Active   bisacodyl (DULCOLAX) 5 MG EC tablet  [id number] Yes Take 2 tablets (10 mg total) by mouth at bedtime. Gillis Santa, MD Taking Active   buPROPion (WELLBUTRIN XL) 300 MG 24 hr tablet [id number] Yes Take 300 mg by mouth daily.  [provider] Taking Active Spouse/Significant Other           Med Note Deretha Emory, [facility]   Thu Nov 28, 2016  1:45 PM)    busPIRone (BUSPAR) 30 MG tablet [id number] Yes Take 30 mg by mouth 2 (two) times daily.  [provider] Taking Active Spouse/Significant Other           Med Note Deretha Emory, [facility]   Thu Nov 28, 2016  1:45 PM)    clopidogrel (PLAVIX) 75 MG tablet [id number] Yes Take 1 tablet (75 mg total) by mouth daily with breakfast. Gillis Santa, MD Taking Active   cyclobenzaprine (FLEXERIL) 5 MG tablet [id number] Yes Take 1 tablet (5 mg total) by mouth at bedtime. Duanne Limerick, MD Taking Active Spouse/Significant Other           Med Note Sharia Reeve   Fri Feb 28, 2023  3:39 PM) Patient only takes as needed  methylphenidate (RITALIN) 20 MG tablet [id number] Yes Take 10-20 mg by mouth 3 (three) times daily with meals. [provider] Taking Active Spouse/Significant Other           Med Note Sharia Reeve   Fri Feb 28, 2023  3:40 PM) Patient takes 20 mg every morning with breakfast, 20 mg with lunch and 10 mg in the afternoon, as needed  oxyCODONE-acetaminophen (PERCOCET) 5-325 MG tablet [id number] Yes Take 1 tablet by mouth every 4 (four) hours as needed for severe pain (pain score 7-10). Candelaria Stagers,  DPM Taking Active Spouse/Significant Other           Med Note Sharia Reeve   Fri Feb 28, 2023  3:39 PM) New medication, not started yet  pantoprazole (PROTONIX) 40 MG tablet [id number] Yes Take 1 tablet (40 mg total) by mouth 2 (two) times daily. Decrease to daily dosing after 30 days Gillis Santa, MD Taking Active   polyethylene glycol (MIRALAX / GLYCOLAX) 17 g packet [id number] Yes Take 17 g by mouth 2 (two) times daily. Gillis Santa, MD Taking Active              Home Care and Equipment/Supplies: Were Home Health Services Ordered?: No Any new equipment or medical supplies ordered?: No  Functional Questionnaire: Do you need assistance with bathing/showering or dressing?: No Do you need assistance with meal preparation?: No Do you need assistance with eating?: No Do you have difficulty maintaining continence: No Do you need assistance with getting out of bed/getting out of a chair/moving?: No (He is Independently ambulatory)  Follow up appointments reviewed: PCP Follow-up appointment confirmed?: Yes Date of PCP follow-up appointment?: 03/07/23 Follow-up Provider: PCP Jennette Kettle [facility] Follow-up appointment confirmed?: Yes Date of Specialist follow-up appointment?: 03/06/23 Follow-Up Specialty Provider:: Vascular 11/14, Podiatry , next week - TBS Do you need transportation to your follow-up appointment?: No (He drives) Do you understand care options if your condition(s) worsen?: Yes-patient verbalized understanding  SDOH Interventions Today    Flowsheet Row Most Recent Value  SDOH Interventions   Food Insecurity Interventions Intervention Not Indicated  Housing Interventions Intervention Not Indicated  Transportation Interventions Intervention Not Indicated, Patient Resources (Friends/Family)  [He was during duimg call]  Utilities Interventions Intervention Not Indicated       Discussed VBCI  TOC program and weekly calls to patient to assess condition/status, medication management  and provide support/education as indicated . Patient/ Caregiver voiced understanding and declined enrollment in the 30-day TOC Program.   He is doing well. He is currently driving and running errands, He feels his care is managed He has appt w/ Vascular 11/24, PCP 11/15 and will follow-up with Podiatry next week and with the combination of the Providers and his current Independence he does not fel he needs additional follow-up    The  patient has been provided with contact information for the care management team and has been advised to call with any health related questions or concerns.    Susa Loffler , BSN, RN Care Management Coordinator [facility] [email or web address] Direct Dial: [phone]

## 2023-03-06 ENCOUNTER — Ambulatory Visit (INDEPENDENT_AMBULATORY_CARE_PROVIDER_SITE_OTHER): Payer: BC Managed Care – PPO | Admitting: Vascular Surgery

## 2023-03-06 ENCOUNTER — Ambulatory Visit (INDEPENDENT_AMBULATORY_CARE_PROVIDER_SITE_OTHER): Payer: BC Managed Care – PPO

## 2023-03-06 ENCOUNTER — Encounter (INDEPENDENT_AMBULATORY_CARE_PROVIDER_SITE_OTHER): Payer: Self-pay | Admitting: Vascular Surgery

## 2023-03-06 VITALS — BP 140/78 | HR 82 | Resp 16 | Ht 63.0 in | Wt 183.0 lb

## 2023-03-06 DIAGNOSIS — I10 Essential (primary) hypertension: Secondary | ICD-10-CM

## 2023-03-06 DIAGNOSIS — I7025 Atherosclerosis of native arteries of other extremities with ulceration: Secondary | ICD-10-CM | POA: Insufficient documentation

## 2023-03-06 DIAGNOSIS — Z9889 Other specified postprocedural states: Secondary | ICD-10-CM | POA: Diagnosis not present

## 2023-03-06 DIAGNOSIS — M519 Unspecified thoracic, thoracolumbar and lumbosacral intervertebral disc disorder: Secondary | ICD-10-CM | POA: Diagnosis not present

## 2023-03-06 DIAGNOSIS — I739 Peripheral vascular disease, unspecified: Secondary | ICD-10-CM

## 2023-03-06 DIAGNOSIS — E781 Pure hyperglyceridemia: Secondary | ICD-10-CM

## 2023-03-06 HISTORY — DX: Atherosclerosis of native arteries of other extremities with ulceration: I70.25

## 2023-03-06 LAB — VAS US ABI WITH/WO TBI
Left ABI: 1.05
Right ABI: 1.05

## 2023-03-06 MED ORDER — OXYCODONE-ACETAMINOPHEN 5-325 MG PO TABS: 1.0000 | ORAL_TABLET | Freq: Four times a day (QID) | ORAL | 0 refills | Status: DC | PRN

## 2023-03-06 NOTE — Progress Notes (Signed)
MRN : [id number]  Alan Trevino is a 62 y.o. ([birth year]) male who presents with chief complaint of check circulation.  History of Present Illness:   The patient returns to the office for followup and review status post angiogram with intervention on 02/28/2023.   Procedure:  Percutaneous transluminal angioplasty and stent placement right superficial femoral artery   The patient notes improvement in the lower extremity symptoms. No interval shortening of the patient's claudication distance or rest pain symptoms. No new ulcers or wounds have occurred since the last visit.  There have been no significant changes to the patient's overall health care.  No documented history of amaurosis fugax or recent TIA symptoms. There are no recent neurological changes noted. No documented history of DVT, PE or superficial thrombophlebitis. The patient denies recent episodes of angina or shortness of breath.   ABI's Rt=1.05 and Lt=1.05  (previous ABI's Rt=0.81 and Lt=1.16)   No outpatient medications have been marked as taking for the 03/06/23 encounter (Appointment) with Gilda Crease, Latina Craver, MD.    Past Medical History:  Diagnosis Date   Arthritis    Depression    GERD (gastroesophageal reflux disease)    H/O ulcer disease    Headache    MIGRAINES   Hypertension    SBO (small bowel obstruction) (HCC)    Wears dentures    full upper    Past Surgical History:  Procedure Laterality Date   COLONOSCOPY WITH PROPOFOL N/A 12/29/2015   Procedure: COLONOSCOPY WITH PROPOFOL;  Surgeon: Midge Minium, MD;  Location: [facility];  Service: Endoscopy;  Laterality: N/A;   COLONOSCOPY WITH PROPOFOL N/A 11/29/2019   Procedure: COLONOSCOPY WITH PROPOFOL;  Surgeon: Midge Minium, MD;  Location: [facility];  Service: Endoscopy;  Laterality: N/A;  priority 4   ESOPHAGEAL DILATION     stretching   ESOPHAGEAL DILATION   01/17/2017   Procedure: ESOPHAGEAL DILATION;  Surgeon: Midge Minium, MD;  Location: [facility];  Service: Gastroenterology;;   ESOPHAGOGASTRODUODENOSCOPY (EGD) WITH PROPOFOL N/A 11/29/2016   Procedure: ESOPHAGOGASTRODUODENOSCOPY (EGD) WITH PROPOFOL;  Surgeon: Midge Minium, MD;  Location: [facility];  Service: Endoscopy;  Laterality: N/A;   ESOPHAGOGASTRODUODENOSCOPY (EGD) WITH PROPOFOL N/A 01/17/2017   Procedure: ESOPHAGOGASTRODUODENOSCOPY (EGD) WITH PROPOFOL;  Surgeon: Midge Minium, MD;  Location: [facility];  Service: Gastroenterology;  Laterality: N/A;   HERNIA REPAIR Right    INGUINAL HERNIA REPAIR Left 01/14/2020   Procedure: HERNIA REPAIR INGUINAL ADULT;  Surgeon: Earline Mayotte, MD;  Location: [facility] ORS;  Service: General;  Laterality: Left;   LOWER EXTREMITY ANGIOGRAPHY Right 02/28/2023   Procedure: Lower Extremity Angiography;  Surgeon: Renford Dills, MD;  Location: [facility] INVASIVE CV LAB;  Service: Cardiovascular;  Laterality: Right;   POLYPECTOMY N/A 12/29/2015   Procedure: POLYPECTOMY;  Surgeon: Midge Minium, MD;  Location: [facility];  Service: Endoscopy;  Laterality: N/A;   POLYPECTOMY  01/17/2017   Procedure: POLYPECTOMY;  Surgeon: Midge Minium, MD;  Location: [facility];  Service: Gastroenterology;;   POLYPECTOMY N/A 11/29/2019   Procedure: POLYPECTOMY;  Surgeon: Midge Minium, MD;  Location: [facility];  Service: Endoscopy;  Laterality: N/A;   SHOULDER SURGERY Right 09/20/2009   TOTAL HIP ARTHROPLASTY Right 12/21/2020   Procedure: TOTAL HIP ARTHROPLASTY ANTERIOR APPROACH;  Surgeon: Kennedy Bucker, MD;  Location: [facility] ORS;  Service: Orthopedics;  Laterality: Right;    Social History Social History   Tobacco Use   Smoking status: Former    Current packs/day: 0.00    Average packs/day: 2.0 packs/day for 30.0 years (60.0 ttl pk-yrs)    Types: Cigarettes    Start date: 18    Quit date: 2000    Years since quitting: 24.8    Smokeless tobacco: Current    Types: Chew  Vaping Use   Vaping status: Never Used  Substance Use Topics   Alcohol use: Yes    Alcohol/week: 12.0 standard drinks of alcohol    Types: 12 Cans of beer per week    Comment: BEER once a week   Drug use: No    Family History Family History  Problem Relation Age of Onset   Ovarian cancer Mother    CAD Father     Allergies  Allergen Reactions   Nsaids     Other Reaction(s): gi bleed     REVIEW OF SYSTEMS (Negative unless checked)  Constitutional: [] Weight loss  [] Fever  [] Chills Cardiac: [] Chest pain   [] Chest pressure   [] Palpitations   [] Shortness of breath when laying flat   [] Shortness of breath with exertion. Vascular:  [x] Pain in legs with walking   [] Pain in legs at rest  [] History of DVT   [] Phlebitis   [] Swelling in legs   [] Varicose veins   [] Non-healing ulcers Pulmonary:   [] Uses home oxygen   [] Productive cough   [] Hemoptysis   [] Wheeze  [] COPD   [] Asthma Neurologic:  [] Dizziness   [] Seizures   [] History of stroke   [] History of TIA  [] Aphasia   [] Vissual changes   [] Weakness or numbness in arm   [] Weakness or numbness in leg Musculoskeletal:   [] Joint swelling   [] Joint pain   [] Low back pain Hematologic:  [] Easy bruising  [] Easy bleeding   [] Hypercoagulable state   [] Anemic Gastrointestinal:  [] Diarrhea   [] Vomiting  [] Gastroesophageal reflux/heartburn   [] Difficulty swallowing. Genitourinary:  [] Chronic kidney disease   [] Difficult urination  [] Frequent urination   [] Blood in urine Skin:  [] Rashes   [] Ulcers  Psychological:  [] History of anxiety   []  History of major depression.  Physical Examination  There were no vitals filed for this visit. There is no height or weight on file to calculate BMI. Gen: WD/WN, NAD Head: [place]/AT, No temporalis wasting.  Ear/Nose/Throat: Hearing grossly intact, nares w/o erythema or drainage Eyes: PER, EOMI, sclera nonicteric.  Neck: Supple, no masses.  No bruit or JVD.  Pulmonary:   Good air movement, no audible wheezing, no use of accessory muscles.  Cardiac: RRR, normal S1, S2, no Murmurs. Vascular:  No trophic changes, several open wounds mostly of left great toe but also the fourth toe.  There are also cyanotic changes consistent with residual distal ischemia.  No evidence of cellulitis or infection Vessel Right Left  Radial Palpable Palpable  PT Palpable Palpable  DP Palpable Palpable  Gastrointestinal: soft, non-distended. No guarding/no peritoneal signs.  Musculoskeletal: M/S 5/5 throughout.  No visible deformity.  Neurologic: CN 2-12 intact. Pain and light touch intact in extremities.  Symmetrical.  Speech is fluent. Motor exam as listed above. Psychiatric: Judgment intact, Mood & affect appropriate for pt's clinical situation. Dermatologic: No  rashes or ulcers noted.  No changes consistent with cellulitis.   CBC Lab Results  Component Value Date   WBC 8.8 03/03/2023   HGB 14.1 03/03/2023   HCT 40.8 03/03/2023   MCV 93.2 03/03/2023   PLT 197 03/03/2023    BMET    Component Value Date/Time   NA 137 03/03/2023 0414   NA 141 11/15/2022 1436   NA 139 04/16/2013 1607   K 4.2 03/03/2023 0414   K 4.0 04/16/2013 1607   CL 105 03/03/2023 0414   CL 97 (L) 04/16/2013 1607   CO2 25 03/03/2023 0414   CO2 31 04/16/2013 1607   GLUCOSE 104 (H) 03/03/2023 0414   GLUCOSE 106 (H) 04/16/2013 1607   BUN 13 03/03/2023 0414   BUN 13 11/15/2022 1436   BUN 10 04/16/2013 1607   CREATININE 1.01 03/03/2023 0414   CREATININE 1.12 04/16/2013 1607   CALCIUM 9.2 03/03/2023 0414   CALCIUM 9.6 04/16/2013 1607   GFRNONAA >60 03/03/2023 0414   GFRNONAA >60 04/16/2013 1607   GFRAA 69 03/09/2020 1423   GFRAA >60 04/16/2013 1607   Estimated Creatinine Clearance: 70.7 mL/min (by C-G formula based on SCr of 1.01 mg/dL).  COAG Lab Results  Component Value Date   INR 1.1 02/28/2023    Radiology PERIPHERAL VASCULAR CATHETERIZATION  Result Date: 02/28/2023 See surgical  note for result.  DG Toe Great Right  Result Date: 02/28/2023 CLINICAL DATA:  Pain and swelling in the great toe, discoloration EXAM: RIGHT GREAT TOE COMPARISON:  01/31/2023 FINDINGS: Stable appearance of the lucency with thin rim along the medial base of the proximal phalanx great toe, probably a geode or degenerative subcortical cystic lesion. No overlying cortical destructive findings to suggest osteomyelitis. There is mild spurring at the first MTP joint. The interphalangeal joint appears unremarkable. No significant gas tracking in the soft tissues. No foreign body noted. IMPRESSION: 1. No radiographic findings of osteomyelitis. 2. Mild spurring at the first MTP joint. 3. Stable appearance of the lucency with thin rim along the medial base of the proximal phalanx great toe, probably a geode or degenerative subcortical cystic lesion. Electronically Signed   By: Gaylyn Rong M.D.   On: 02/28/2023 16:51   VAS [place] ABI WITH/WO TBI  Result Date: 02/27/2023  LOWER EXTREMITY DOPPLER STUDY Patient Name:  ASPEN JHA  Date of Exam:   02/27/2023 Medical Rec #: [id number]        Accession #:    [id number] Date of Birth: 13-Nov-1960        Patient Gender: M Patient Age:   56 years Exam Location:  [facility] Procedure:      VAS [place] ABI WITH/WO TBI Referring Phys: Caryn Bee PATEL --------------------------------------------------------------------------------  Indications: Claudication, and peripheral artery disease. High Risk Factors: Hypertension, hyperlipidemia, past history of smoking.  Performing Technologist: Hardie Lora RVT  Examination Guidelines: A complete evaluation includes at minimum, Doppler waveform signals and systolic blood pressure reading at the level of bilateral brachial, anterior tibial, and posterior tibial arteries, when vessel segments are accessible. Bilateral testing is considered an integral part of a complete examination. Photoelectric Plethysmograph (PPG) waveforms and  toe systolic pressure readings are included as required and additional duplex testing as needed. Limited examinations for reoccurring indications may be performed as noted.  ABI Findings: +---------+------------------+-----+----------+--------+ Right    Rt Pressure (mmHg)IndexWaveform  Comment  +---------+------------------+-----+----------+--------+ Brachial 129                                       +---------+------------------+-----+----------+--------+  PTA      109               0.81 monophasic         +---------+------------------+-----+----------+--------+ DP       103               0.77 monophasic         +---------+------------------+-----+----------+--------+ Great Toe77                0.57                    +---------+------------------+-----+----------+--------+ +---------+------------------+-----+---------+-------+ Left     Lt Pressure (mmHg)IndexWaveform Comment +---------+------------------+-----+---------+-------+ Brachial 134                                     +---------+------------------+-----+---------+-------+ PTA      155               1.16 triphasic        +---------+------------------+-----+---------+-------+ DP       135               1.01 triphasic        +---------+------------------+-----+---------+-------+ Kerby Nora               0.87                  +---------+------------------+-----+---------+-------+ +-------+-----------+-----------+------------+------------+ ABI/TBIToday's ABIToday's TBIPrevious ABIPrevious TBI +-------+-----------+-----------+------------+------------+ Right  0.81       0.57                                +-------+-----------+-----------+------------+------------+ Left   1.16       0.87                                +-------+-----------+-----------+------------+------------+   Summary: Right: Resting right ankle-brachial index indicates mild right lower extremity arterial disease.  The right toe-brachial index is abnormal. Limited imaging showed >50% mid right SFA stenosis. Left: Resting left ankle-brachial index is within normal range. The left toe-brachial index is normal. *See table(s) above for measurements and observations.  Electronically signed by Levora Dredge MD on 02/27/2023 at 6:01:54 PM.    Final      Assessment/Plan 1. Atherosclerosis of native arteries of the extremities with ulceration (HCC) Recommend:  The patient is status post successful angiogram with intervention.  The patient reports that the claudication symptoms and leg pain has improved.   The patient denies lifestyle limiting changes at this point in time.  However, there was significant damage done to his toes by the embolic event and although he has had return of palpable pulses of the right foot and both the DP and PT distributions he still has some ulcerations and cyanotic changes of the toes themselves.  This is continuing to cause him severe pain.  He is requesting another refill of his Percocet which I will do.  He is to follow-up with podiatry next week he said.  No further invasive studies, angiography or surgery at this time. The patient should continue walking and begin a more formal exercise program.  The patient should continue antiplatelet therapy and aggressive treatment of the lipid abnormalities  Continued surveillance is indicated as atherosclerosis is likely to progress with time.    Patient should undergo  noninvasive studies as ordered. The patient will follow up with me to review the studies.  - VAS [place] LOWER EXTREMITY ARTERIAL DUPLEX; Future - VAS [place] ABI WITH/WO TBI; Future  2. Essential (primary) hypertension Continue antihypertensive medications as already ordered, these medications have been reviewed and there are no changes at this time.  3. Lumbar disc disease Continue medications to treat the patient's degenerative disease as already ordered, these medications have  been reviewed and there are no changes at this time.  Continued activity and therapy was stressed.  4. Pure hypertriglyceridemia Continue statin as ordered and reviewed, no changes at this time    Levora Dredge, MD  03/06/2023 10:56 AM

## 2023-03-07 ENCOUNTER — Ambulatory Visit: Payer: BC Managed Care – PPO | Admitting: Family Medicine

## 2023-03-07 ENCOUNTER — Encounter: Payer: Self-pay | Admitting: Family Medicine

## 2023-03-07 VITALS — BP 122/70 | HR 72 | Temp 97.8°F | Ht 63.0 in | Wt 183.0 lb

## 2023-03-07 DIAGNOSIS — L03031 Cellulitis of right toe: Secondary | ICD-10-CM

## 2023-03-07 DIAGNOSIS — S300XXA Contusion of lower back and pelvis, initial encounter: Secondary | ICD-10-CM

## 2023-03-07 DIAGNOSIS — I998 Other disorder of circulatory system: Secondary | ICD-10-CM

## 2023-03-07 NOTE — Progress Notes (Signed)
Date:  03/07/2023   Name:  Alan Trevino   DOB:  01/03/1961   MRN:  [id number]   Chief Complaint: Hospitalization Follow-up  Patient is being followed for dogbite.Patient is a 62 year old male who presents for a dog bite exam exam. The patient reports the following problems: right buttock crush injury. Health maintenance has been reviewed up to date.     Follow up Hospitalization  Patient was admitted to [facility] from podiatry on 02/28/23 and discharged on 03/03/23. He was treated for ischemic limb with stent to SFA. Treatment for this included angioplasty/stent. Telephone follow up was done on 11/12 24 He reports excellent compliance with treatment. He reports this condition is improved.  ----------------------------------------------------------------------------------------- -  Lab Results  Component Value Date   NA 137 03/03/2023   K 4.2 03/03/2023   CO2 25 03/03/2023   GLUCOSE 104 (H) 03/03/2023   BUN 13 03/03/2023   CREATININE 1.01 03/03/2023   CALCIUM 9.2 03/03/2023   EGFR 68 11/15/2022   GFRNONAA >60 03/03/2023   Lab Results  Component Value Date   CHOL 163 11/15/2022   HDL 51 11/15/2022   LDLCALC 92 11/15/2022   TRIG 108 11/15/2022   CHOLHDL 5.6 (H) 02/01/2019   No results found for: "TSH" No results found for: "HGBA1C" Lab Results  Component Value Date   WBC 8.8 03/03/2023   HGB 14.1 03/03/2023   HCT 40.8 03/03/2023   MCV 93.2 03/03/2023   PLT 197 03/03/2023   Lab Results  Component Value Date   ALT 34 03/01/2023   AST 36 03/01/2023   ALKPHOS 58 03/01/2023   BILITOT 0.9 03/01/2023   No results found for: "25OHVITD2", "25OHVITD3", "VD25OH"   Review of Systems  Constitutional:  Negative for chills, diaphoresis, fatigue and fever.  HENT:  Negative for dental problem and postnasal drip.   Respiratory:  Negative for chest tightness and wheezing.   Cardiovascular:  Negative for chest pain, palpitations and leg swelling.  Skin:   Positive for wound. Negative for color change, pallor and rash.       ecchymosis    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   History of colonic polyps    Acute peptic ulcer without hemorrhage and perforation    Hiatal hernia    Esophagogastric ulcer    Stricture and stenosis of esophagus    GIB (gastrointestinal bleeding) 11/30/2016   Hematemesis without nausea    Acute esophagogastric ulcer    Upper GI bleed 11/28/2016   Acute recurrent maxillary sinusitis 09/07/2016   Special screening for malignant neoplasms, colon    Benign neoplasm of ascending colon    Benign neoplasm of transverse colon    Benign neoplasm of descending colon    Rectal polyp    Lumbar disc disease 11/03/2015   Iron deficiency anemia 08/25/2014   Acute gastrointestinal bleeding 08/25/2014   Acute back pain with sciatica 08/25/2014   Routine general medical examination at a health care facility 08/25/2014   DDD (degenerative disc disease), lumbosacral 08/25/2014   Recurrent major depressive episodes (HCC) 08/25/2014   Essential (primary) hypertension 08/25/2014   HLD (hyperlipidemia) 08/25/2014    Allergies  Allergen Reactions   Nsaids  Other Reaction(s): gi bleed    Past Surgical History:  Procedure Laterality Date   COLONOSCOPY WITH PROPOFOL N/A 12/29/2015   Procedure: COLONOSCOPY WITH PROPOFOL;  Surgeon: Midge Minium, MD;  Location: [facility];  Service: Endoscopy;  Laterality: N/A;   COLONOSCOPY WITH PROPOFOL N/A 11/29/2019   Procedure: COLONOSCOPY WITH PROPOFOL;  Surgeon: Midge Minium, MD;  Location: [facility];  Service: Endoscopy;  Laterality: N/A;  priority 4   ESOPHAGEAL DILATION      stretching   ESOPHAGEAL DILATION  01/17/2017   Procedure: ESOPHAGEAL DILATION;  Surgeon: Midge Minium, MD;  Location: [facility];  Service: Gastroenterology;;   ESOPHAGOGASTRODUODENOSCOPY (EGD) WITH PROPOFOL N/A 11/29/2016   Procedure: ESOPHAGOGASTRODUODENOSCOPY (EGD) WITH PROPOFOL;  Surgeon: Midge Minium, MD;  Location: [facility] ENDOSCOPY;  Service: Endoscopy;  Laterality: N/A;   ESOPHAGOGASTRODUODENOSCOPY (EGD) WITH PROPOFOL N/A 01/17/2017   Procedure: ESOPHAGOGASTRODUODENOSCOPY (EGD) WITH PROPOFOL;  Surgeon: Midge Minium, MD;  Location: [facility];  Service: Gastroenterology;  Laterality: N/A;   HERNIA REPAIR Right    INGUINAL HERNIA REPAIR Left 01/14/2020   Procedure: HERNIA REPAIR INGUINAL ADULT;  Surgeon: Earline Mayotte, MD;  Location: [facility] ORS;  Service: General;  Laterality: Left;   LOWER EXTREMITY ANGIOGRAPHY Right 02/28/2023   Procedure: Lower Extremity Angiography;  Surgeon: Renford Dills, MD;  Location: [facility] INVASIVE CV LAB;  Service: Cardiovascular;  Laterality: Right;   POLYPECTOMY N/A 12/29/2015   Procedure: POLYPECTOMY;  Surgeon: Midge Minium, MD;  Location: [facility];  Service: Endoscopy;  Laterality: N/A;   POLYPECTOMY  01/17/2017   Procedure: POLYPECTOMY;  Surgeon: Midge Minium, MD;  Location: [facility];  Service: Gastroenterology;;   POLYPECTOMY N/A 11/29/2019   Procedure: POLYPECTOMY;  Surgeon: Midge Minium, MD;  Location: [facility];  Service: Endoscopy;  Laterality: N/A;   SHOULDER SURGERY Right 09/20/2009   TOTAL HIP ARTHROPLASTY Right 12/21/2020   Procedure: TOTAL HIP ARTHROPLASTY ANTERIOR APPROACH;  Surgeon: Kennedy Bucker, MD;  Location: [facility] ORS;  Service: Orthopedics;  Laterality: Right;    Social History   Tobacco Use   Smoking status: Former    Current packs/day: 0.00    Average packs/day: 2.0 packs/day for 30.0 years (60.0 ttl pk-yrs)    Types: Cigarettes    Start date: 78    Quit date: 2000    Years  since quitting: 24.8   Smokeless tobacco: Current    Types: Chew  Vaping Use   Vaping status: Never Used  Substance Use Topics   Alcohol use: Yes    Alcohol/week: 12.0 standard drinks of alcohol    Types: 12 Cans of beer per week    Comment: BEER once a week   Drug use: No     Medication list has been reviewed and updated.  Current Meds  Medication Sig   amLODipine-benazepril (LOTREL) 10-20 MG capsule Take 1 capsule by mouth daily.   aspirin 81 MG chewable tablet Chew 1 tablet (81 mg total) by mouth daily.   atorvastatin (LIPITOR) 40 MG tablet Take 1 tablet (40 mg total) by mouth daily. TAKE 1 TABLET BY MOUTH EVERY DAY   bisacodyl (DULCOLAX) 5 MG EC tablet Take 2 tablets (10 mg total) by mouth at bedtime.   buPROPion (WELLBUTRIN XL) 300 MG 24 hr tablet Take 300 mg by mouth daily.    busPIRone (BUSPAR) 30 MG tablet Take 30 mg by mouth 2 (two) times daily.    clopidogrel (PLAVIX) 75 MG tablet Take 1 tablet (75 mg total) by mouth daily  with breakfast.   cyclobenzaprine (FLEXERIL) 5 MG tablet Take 1 tablet (5 mg total) by mouth at bedtime.   methylphenidate (RITALIN) 20 MG tablet Take 10-20 mg by mouth 3 (three) times daily with meals.   oxyCODONE-acetaminophen (PERCOCET) 5-325 MG tablet Take 1 tablet by mouth every 6 (six) hours as needed for severe pain (pain score 7-10).   pantoprazole (PROTONIX) 40 MG tablet Take 1 tablet (40 mg total) by mouth 2 (two) times daily. Decrease to daily dosing after 30 days   polyethylene glycol (MIRALAX / GLYCOLAX) 17 g packet Take 17 g by mouth 2 (two) times daily.       03/07/2023    1:25 PM 11/15/2022    1:50 PM 05/17/2022    3:08 PM 11/16/2021   10:04 AM  GAD 7 : Generalized Anxiety Score  Nervous, Anxious, on Edge 0 0 0 1  Control/stop worrying 0 0 0 1  Worry too much - different things 0 0 0 0  Trouble relaxing 0 0 0 0  Restless 0 0 0 0  Easily annoyed or irritable 0 0 0 1  Afraid - awful might happen 0 0 0 0  Total GAD 7 Score 0 0 0 3   Anxiety Difficulty Not difficult at all Not difficult at all Not difficult at all Not difficult at all       03/07/2023    1:25 PM 11/15/2022    1:50 PM 05/17/2022    3:08 PM  Depression screen PHQ 2/9  Decreased Interest 0 0 1  Down, Depressed, Hopeless 0 0 1  PHQ - 2 Score 0 0 2  Altered sleeping 0 0 0  Tired, decreased energy 1 0 0  Change in appetite 0 0 0  Feeling bad or failure about yourself  0 0 0  Trouble concentrating 0 0 0  Moving slowly or fidgety/restless 0 0 0  Suicidal thoughts 0 0 0  PHQ-9 Score 1 0 2  Difficult doing work/chores Not difficult at all Not difficult at all Not difficult at all    BP Readings from Last 3 Encounters:  03/07/23 122/70  03/06/23 (!) 140/78  03/03/23 (!) 151/72    Physical Exam Vitals and nursing note reviewed.  HENT:     Head: Normocephalic.     Right Ear: External ear normal.     Left Ear: External ear normal.     Nose: Nose normal.  Eyes:     General: No scleral icterus.       Right eye: No discharge.        Left eye: No discharge.     Conjunctiva/sclera: Conjunctivae normal.     Pupils: Pupils are equal, round, and reactive to light.  Neck:     Thyroid: No thyromegaly.     Vascular: No JVD.     Trachea: No tracheal deviation.  Cardiovascular:     Rate and Rhythm: Normal rate and regular rhythm.     Heart sounds: Normal heart sounds. No murmur heard.    No friction rub. No gallop.  Pulmonary:     Effort: No respiratory distress.     Breath sounds: Normal breath sounds. No wheezing or rales.  Abdominal:     General: Bowel sounds are normal.     Palpations: Abdomen is soft. There is no mass.     Tenderness: There is no abdominal tenderness. There is no guarding or rebound.  Genitourinary:    Comments: Right buttock ecchymosis/canine marks without erythema/with palpable  induration  Musculoskeletal:        General: No tenderness. Normal range of motion.     Cervical back: Normal range of motion and neck supple.   Lymphadenopathy:     Cervical: No cervical adenopathy.  Skin:    General: Skin is warm.     Findings: No rash.     Comments: Cyanotic right great toe/questioanable purulence under toenail.   Neurological:     Mental Status: He is alert and oriented to person, place, and time.     Cranial Nerves: No cranial nerve deficit.     Deep Tendon Reflexes: Reflexes are normal and symmetric.     Wt Readings from Last 3 Encounters:  03/07/23 183 lb (83 kg)  03/06/23 183 lb (83 kg)  02/28/23 179 lb 14.3 oz (81.6 kg)    BP 122/70   Pulse 72   Temp 97.8 F (36.6 C) (Oral)   Ht 5\' 3"  (1.6 m)   Wt 183 lb (83 kg)   SpO2 100%   BMI 32.42 kg/m   Assessment and Plan: 1. Cellulitis of great toe of right foot New onset.  In the setting of his ischemic toe see #2.  In the setting of peripheral vascular disease status post recent stent of SFA.  On evaluation patient has erythema along the toenail line and with gentle pressure it looks like that there is moisture perhaps purulence underneath the toe my concern is that there is a secondary infection under the toenail of the great toe and whether or not this needs to be debrided and cleaned of devitalized tissue.  And also if patient needs to be started initially on antibiotic and discharged on oral antibiotics and we will follow-up but needs to be evaluated at the level of emergency room.  2. Ischemic toe Underlying ischemic toe secondary to severe peripheral vascular disease currently is on dual antiplatelet therapy.  I do not think it would be prudent just to start an antibiotic and whether or not needs to be evaluated at the podiatry level immediately and since this is Friday afternoon and accesses noncommittal and removed recently was seen by podiatry in [place] on the patient does not want to return to that setting so we will return to the emergency room for evaluation and perhaps a podiatrist on-call could take a look.  3. Contusion of right  buttock As if this guy does not have enough problems he got bit in the right buttock by a pit bull whose vaccination ran out but is being watched and is aware of by the sheriff in the area.  In the event that I do not see any infection but there is induration and slight tenderness but no fluctuance that would suggest infection there is minor breakage of skin but this is probably through clothing and that I do not think that there is a underlying infection.  We will continue to watch this I did check to see that his tetanus was up-to-date    Elizabeth Sauer, MD

## 2023-03-08 ENCOUNTER — Other Ambulatory Visit: Payer: Self-pay

## 2023-03-08 ENCOUNTER — Emergency Department
Admission: EM | Admit: 2023-03-08 | Discharge: 2023-03-09 | Disposition: A | Discharge status: Home / Self Care | Payer: BC Managed Care – PPO | Attending: Emergency Medicine | Admitting: Emergency Medicine

## 2023-03-08 DIAGNOSIS — L03031 Cellulitis of right toe: Secondary | ICD-10-CM | POA: Diagnosis not present

## 2023-03-08 DIAGNOSIS — I10 Essential (primary) hypertension: Secondary | ICD-10-CM | POA: Diagnosis not present

## 2023-03-08 DIAGNOSIS — M79674 Pain in right toe(s): Secondary | ICD-10-CM | POA: Diagnosis present

## 2023-03-08 LAB — COMPREHENSIVE METABOLIC PANEL
ALT: 27 U/L (ref 0–44)
AST: 26 U/L (ref 15–41)
Albumin: 4.6 g/dL (ref 3.5–5.0)
Alkaline Phosphatase: 65 U/L (ref 38–126)
Anion gap: 9 (ref 5–15)
BUN: 17 mg/dL (ref 8–23)
CO2: 23 mmol/L (ref 22–32)
Calcium: 9.2 mg/dL (ref 8.9–10.3)
Chloride: 103 mmol/L (ref 98–111)
Creatinine, Ser: 1.32 mg/dL — ABNORMAL HIGH (ref 0.61–1.24)
GFR, Estimated: >60 mL/min (ref >60)
Glucose, Bld: 115 mg/dL — ABNORMAL HIGH (ref 70–99)
Potassium: 3.9 mmol/L (ref 3.5–5.1)
Sodium: 135 mmol/L (ref 135–145)
Total Bilirubin: 0.5 mg/dL (ref <1.2)
Total Protein: 8.3 g/dL — ABNORMAL HIGH (ref 6.5–8.1)

## 2023-03-08 LAB — CBC WITH DIFFERENTIAL/PLATELET
Abs Immature Granulocytes: 0.03 10*3/uL (ref 0.00–0.07)
Basophils Absolute: 0.1 10*3/uL (ref 0.0–0.1)
Basophils Relative: 1 %
Eosinophils Absolute: 0.4 10*3/uL (ref 0.0–0.5)
Eosinophils Relative: 3 %
HCT: 44.8 % (ref 39.0–52.0)
Hemoglobin: 15.5 g/dL (ref 13.0–17.0)
Immature Granulocytes: 0 %
Lymphocytes Relative: 17 %
Lymphs Abs: 1.8 10*3/uL (ref 0.7–4.0)
MCH: 32.3 pg (ref 26.0–34.0)
MCHC: 34.6 g/dL (ref 30.0–36.0)
MCV: 93.3 fL (ref 80.0–100.0)
Monocytes Absolute: 0.9 10*3/uL (ref 0.1–1.0)
Monocytes Relative: 8 %
Neutro Abs: 7.5 10*3/uL (ref 1.7–7.7)
Neutrophils Relative %: 71 %
Platelets: 277 10*3/uL (ref 150–400)
RBC: 4.8 MIL/uL (ref 4.22–5.81)
RDW: 11.6 % (ref 11.5–15.5)
WBC: 10.7 10*3/uL — ABNORMAL HIGH (ref 4.0–10.5)
nRBC: 0 % (ref 0.0–0.2)

## 2023-03-08 LAB — URINALYSIS, W/ REFLEX TO CULTURE (INFECTION SUSPECTED)
Bacteria, UA: NONE SEEN
Bilirubin Urine: NEGATIVE
Glucose, UA: NEGATIVE mg/dL
Ketones, ur: NEGATIVE mg/dL
Leukocytes,Ua: NEGATIVE
Nitrite: NEGATIVE
Protein, ur: NEGATIVE mg/dL
Specific Gravity, Urine: 1.015 (ref 1.005–1.030)
Squamous Epithelial / HPF: 0 /[HPF] (ref 0–5)
pH: 5 (ref 5.0–8.0)

## 2023-03-08 LAB — LACTIC ACID, PLASMA: Lactic Acid, Venous: 1.9 mmol/L (ref 0.5–1.9)

## 2023-03-08 NOTE — ED Triage Notes (Signed)
Pt to ed from home via POV for post op problem. He had a stent placed in his right leg last week. Pt went to PCP yesterday and they are concerned his toe is infect4ed. He went to the vein doctor on Thursday and all was well. Pt toe is painful and red but appears to be normal based on his procedure and what it looked like before. Pt is caox4, in no acute distress and ambulatory in triage.

## 2023-03-08 NOTE — ED Notes (Signed)
Patient up to stat desk states he is considering leaving.  Patient encouraged to stay and has agreed to stay a bit longer.

## 2023-03-09 MED ORDER — CEPHALEXIN 500 MG PO CAPS
500.0000 mg | ORAL_CAPSULE | Freq: Once | ORAL | Status: AC
  Administered 2023-03-09: 500 mg via ORAL
  Filled 2023-03-09: qty 1

## 2023-03-09 MED ORDER — CEPHALEXIN 500 MG PO CAPS: 500.0000 mg | ORAL_CAPSULE | Freq: Four times a day (QID) | ORAL | 0 refills | Status: AC

## 2023-03-09 NOTE — ED Provider Notes (Signed)
[facility] Provider Note    Event Date/Time   First MD Initiated Contact with Patient 03/08/23 2337     (approximate)   History   Post-op Problem   HPI  Alan Trevino is a 62 y.o. male with history of hypertension, hyperlipidemia, GERD, GI bleed, peripheral artery disease, limb ischemia who presents with redness and pain to his right great toe after a recent angioplasty and stent to the right leg.  The patient states that he was admitted last week after having an acute blockage and had a stent done.  Pain to his leg improved, but he started having pain and redness to the right great toe which was thought to be due to emboli.  He was seen by Dr. Gilda Crease from vascular surgery for follow-up 2 days ago, and was noted to be improving.  However he saw his primary care provider yesterday who was concerned about acute cellulitis in the toe and advised him to come to the ED for more urgent evaluation including possible podiatry consultation.  The patient states that he has been soaking the foot in Epsom salt.  He states that the redness has improved.  He states that there was some drainage of pus or liquid from under the toe before that has also resolved.  He has no redness or pain going up the foot or leg.  He has no fever or chills.  He states he overall is feeling better.  I reviewed the past medical records per the patient was admitted earlier this month with acute right lower limb ischemia and was treated with angioplasty and a stent insertion on 11/8.  He was seen by Dr. Gilda Crease for follow-up on 11/14 and was noted to have significantly improved symptoms but was noted to have ulcerations and cyanotic changes of the toes after he had an acute embolic event.  Subsequently he was seen by his PMD Dr. Yetta Barre on 11/15 at which time he was diagnosed with cellulitis of the great toe, and ischemic toe, and was recommended to go to the ED.  He also had a dog bite to the right  buttock at that time.   Physical Exam   Triage Vital Signs: ED Triage Vitals  Encounter Vitals Group     BP 03/08/23 1915 (!) 169/89     Systolic BP Percentile --      Diastolic BP Percentile --      Pulse Rate 03/08/23 1915 80     Resp 03/08/23 1915 14     Temp 03/08/23 1915 98 F (36.7 C)     Temp Source 03/08/23 1915 Oral     SpO2 03/08/23 1915 100 %     Weight 03/08/23 1912 182 lb 15.7 oz (83 kg)     Height 03/08/23 1912 5\' 3"  (1.6 m)     Head Circumference --      Peak Flow --      Pain Score 03/08/23 1912 4     Pain Loc --      Pain Education --      Exclude from Growth Chart --     Most recent vital signs: Vitals:   03/09/23 0030 03/09/23 0100  BP: (!) 143/76   Pulse: 76   Resp: 16 16  Temp:    SpO2: 97%      General: Awake, no distress.  CV:  Good peripheral perfusion.  Resp:  Normal effort.  Abd:  No distention.  Other:  2+ DP  pulse on the right.  Right great toe with slight erythema and induration.  No open wounds or ulcers.  No purulent drainage.  Normal cap refill.  No erythema or induration going up the foot or leg.   ED Results / Procedures / Treatments   Labs (all labs ordered are listed, but only abnormal results are displayed) Labs Reviewed  COMPREHENSIVE METABOLIC PANEL - Abnormal; Notable for the following components:      Result Value   Glucose, Bld 115 (*)    Creatinine, Ser 1.32 (*)    Total Protein 8.3 (*)    All other components within normal limits  CBC WITH DIFFERENTIAL/PLATELET - Abnormal; Notable for the following components:   WBC 10.7 (*)    All other components within normal limits  URINALYSIS, W/ REFLEX TO CULTURE (INFECTION SUSPECTED) - Abnormal; Notable for the following components:   Color, Urine YELLOW (*)    APPearance CLEAR (*)    Hgb urine dipstick SMALL (*)    All other components within normal limits  LACTIC ACID, PLASMA     EKG   RADIOLOGY   PROCEDURES:  Critical Care performed:  No  Procedures   MEDICATIONS ORDERED IN ED: Medications  cephALEXin (KEFLEX) capsule 500 mg (500 mg Oral Given 03/09/23 0123)     IMPRESSION / MDM / ASSESSMENT AND PLAN / ED COURSE  I reviewed the triage vital signs and the nursing notes.  62 year old male with PMH as noted above presents with redness and pain to his right great toe after angioplasty and stent last week; he was seen by his vascular surgeon 2 days ago and was thought to be improving, but then his PMD saw him yesterday and advised him to come to ED for evaluation of cellulitis and for possible podiatry evaluation.  Differential diagnosis includes, but is not limited to, cellulitis, postischemic pain.  Based on the current exam, there is no evidence of ongoing limb ischemia.  The patient has a strong DP pulse, good cap refill in the toe, and no concerning discoloration.  There does appear to be cellulitis in the toe although there are no open wounds and no purulent drainage.  It appears improved compared to what the PMD documented yesterday, and the patient also feels that is improving.  Lab workup was obtained and is also reassuring.  CBC shows minimal leukocytosis.  Lactate is normal.  I will consult vascular surgery to discuss further management.  Based on the current evaluation, there is no indication for emergent podiatry consultation or any procedural intervention at this time.  Patient's presentation is most consistent with acute presentation with potential threat to life or bodily function.  ----------------------------------------- 1:20 AM on 03/09/2023 -----------------------------------------  I consulted and discussed the case with Dr. Evie Lacks from vascular surgery.  She agrees that based on the current clinical exam, lab workup, and trajectory of the symptoms, she is not concern for acute limb ischemia.  She agrees that there is no indication for emergent podiatry consultation or procedural intervention.  She  recommends starting the patient on antibiotic, having follow-up with podiatry this week, and discharge him home.  The patient feels well and is eager to go home.  I counseled him on the results of the lab workup and on my discussion with vascular and the recommendations for follow-up.  He is in agreement.  I also gave him strict return precautions and he expressed understanding.  I prescribed a course of Keflex.  He is stable for discharge home  at this time.   FINAL CLINICAL IMPRESSION(S) / ED DIAGNOSES   Final diagnoses:  Cellulitis of great toe of right foot     Rx / DC Orders   ED Discharge Orders          Ordered    cephALEXin (KEFLEX) 500 MG capsule  4 times daily        03/09/23 0119             Note:  This document was prepared using Dragon voice recognition software and may include unintentional dictation errors.    Dionne Bucy, MD 03/09/23 0221

## 2023-03-09 NOTE — Discharge Instructions (Signed)
Take the antibiotic as prescribed.  Make an appointment to follow-up with your podiatrist this week.  Return to the ER immediately for new, worsening, or persistent severe pain, redness or swelling spreading up the foot or into the leg, pus drainage from the toe, open wounds or lesions, fever or chills, or any other new or worsening symptoms that concern you.

## 2023-03-10 ENCOUNTER — Telehealth: Payer: Self-pay | Admitting: Family Medicine

## 2023-03-10 NOTE — Telephone Encounter (Signed)
Called pt left him know that [facility] put that he can go back to work today 03/10/23. Told pt if he feels like he needs to be out of work longer to call [facility]. Pt verbalized understanding.  KP

## 2023-03-10 NOTE — Telephone Encounter (Signed)
Copied from CRM [id number]. Topic: General - Inquiry >> Mar 10, 2023 10:30 AM De Blanch wrote: Reason for CRM: Pt is calling requesting an appointment for ED 11/16 f/u of right foot. Stated would like a work note to be out of work for the rest of the week.    Please advise.

## 2023-03-11 ENCOUNTER — Telehealth (INDEPENDENT_AMBULATORY_CARE_PROVIDER_SITE_OTHER): Payer: Self-pay

## 2023-03-11 ENCOUNTER — Telehealth (INDEPENDENT_AMBULATORY_CARE_PROVIDER_SITE_OTHER): Payer: Self-pay | Admitting: Vascular Surgery

## 2023-03-11 ENCOUNTER — Encounter (INDEPENDENT_AMBULATORY_CARE_PROVIDER_SITE_OTHER): Payer: Self-pay | Admitting: Nurse Practitioner

## 2023-03-11 NOTE — Telephone Encounter (Signed)
Sent note to his mycahrt

## 2023-03-11 NOTE — Telephone Encounter (Signed)
Pt.notified

## 2023-03-11 NOTE — Progress Notes (Addendum)
Date:  03/07/2023   Name:  Alan Trevino   DOB:  1960/09/05   MRN:  [id number]   Chief Complaint: Hospitalization Follow-up  Follow up Hospitalization  Patient was admitted to [facility] on 11/ 8 and discharged on 11/11 ischemic limb. He was treated for ischemic melanoma. Treatment for this included stent to right SFA. Telephone follow up was done on 11/12 He reports good compliance with treatment. He reports this condition is .  ----------------------------------------------------------------------------------------- -       Lab Results  Component Value Date   NA 135 03/08/2023   K 3.9 03/08/2023   CO2 23 03/08/2023   GLUCOSE 115 (H) 03/08/2023   BUN 17 03/08/2023   CREATININE 1.32 (H) 03/08/2023   CALCIUM 9.2 03/08/2023   EGFR 68 11/15/2022   GFRNONAA >60 03/08/2023   Lab Results  Component Value Date   CHOL 163 11/15/2022   HDL 51 11/15/2022   LDLCALC 92 11/15/2022   TRIG 108 11/15/2022   CHOLHDL 5.6 (H) 02/01/2019   No results found for: "TSH" No results found for: "HGBA1C" Lab Results  Component Value Date   WBC 10.7 (H) 03/08/2023   HGB 15.5 03/08/2023   HCT 44.8 03/08/2023   MCV 93.3 03/08/2023   PLT 277 03/08/2023   Lab Results  Component Value Date   ALT 27 03/08/2023   AST 26 03/08/2023   ALKPHOS 65 03/08/2023   BILITOT 0.5 03/08/2023   No results found for: "25OHVITD2", "25OHVITD3", "VD25OH"   Review of Systems  Constitutional:  Negative for chills and fever.  HENT:  Negative for drooling, ear discharge, ear pain and sore throat.   Respiratory:  Negative for cough, shortness of breath and wheezing.   Cardiovascular:  Negative for chest pain, palpitations and leg swelling.  Gastrointestinal:  Negative for abdominal pain, blood in stool, constipation, diarrhea and nausea.  Endocrine: Negative for polydipsia.  Genitourinary:  Negative for dysuria, frequency, hematuria and urgency.  Musculoskeletal:  Negative for back pain,  myalgias and neck pain.  Skin:  Negative for rash.  Allergic/Immunologic: Negative for environmental allergies.  Neurological:  Negative for dizziness and headaches.  Hematological:  Does not bruise/bleed easily.  Psychiatric/Behavioral:  Negative for suicidal ideas. The patient is not nervous/anxious.     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   History of colonic polyps    Acute peptic ulcer without hemorrhage and perforation    Hiatal hernia    Esophagogastric ulcer    Stricture and stenosis of esophagus    GIB (gastrointestinal bleeding) 11/30/2016   Hematemesis without nausea    Acute esophagogastric ulcer    Upper GI bleed 11/28/2016   Acute recurrent maxillary sinusitis 09/07/2016   Special screening for malignant neoplasms, colon    Benign neoplasm of ascending colon    Benign neoplasm of transverse colon    Benign neoplasm of descending colon    Rectal polyp    Lumbar disc disease 11/03/2015   Iron deficiency anemia 08/25/2014   Acute gastrointestinal bleeding 08/25/2014   Acute back pain with sciatica 08/25/2014   Routine general medical examination at a health care facility 08/25/2014   DDD (degenerative disc disease), lumbosacral 08/25/2014  Recurrent major depressive episodes (HCC) 08/25/2014   Essential (primary) hypertension 08/25/2014   HLD (hyperlipidemia) 08/25/2014    Allergies  Allergen Reactions   Nsaids     Other Reaction(s): gi bleed    Past Surgical History:  Procedure Laterality Date   COLONOSCOPY WITH PROPOFOL N/A 12/29/2015   Procedure: COLONOSCOPY WITH PROPOFOL;  Surgeon: Midge Minium, MD;  Location: [facility];  Service: Endoscopy;   Laterality: N/A;   COLONOSCOPY WITH PROPOFOL N/A 11/29/2019   Procedure: COLONOSCOPY WITH PROPOFOL;  Surgeon: Midge Minium, MD;  Location: [facility];  Service: Endoscopy;  Laterality: N/A;  priority 4   ESOPHAGEAL DILATION     stretching   ESOPHAGEAL DILATION  01/17/2017   Procedure: ESOPHAGEAL DILATION;  Surgeon: Midge Minium, MD;  Location: [facility];  Service: Gastroenterology;;   ESOPHAGOGASTRODUODENOSCOPY (EGD) WITH PROPOFOL N/A 11/29/2016   Procedure: ESOPHAGOGASTRODUODENOSCOPY (EGD) WITH PROPOFOL;  Surgeon: Midge Minium, MD;  Location: [facility] ENDOSCOPY;  Service: Endoscopy;  Laterality: N/A;   ESOPHAGOGASTRODUODENOSCOPY (EGD) WITH PROPOFOL N/A 01/17/2017   Procedure: ESOPHAGOGASTRODUODENOSCOPY (EGD) WITH PROPOFOL;  Surgeon: Midge Minium, MD;  Location: [facility];  Service: Gastroenterology;  Laterality: N/A;   HERNIA REPAIR Right    INGUINAL HERNIA REPAIR Left 01/14/2020   Procedure: HERNIA REPAIR INGUINAL ADULT;  Surgeon: Earline Mayotte, MD;  Location: [facility] ORS;  Service: General;  Laterality: Left;   LOWER EXTREMITY ANGIOGRAPHY Right 02/28/2023   Procedure: Lower Extremity Angiography;  Surgeon: Renford Dills, MD;  Location: [facility] INVASIVE CV LAB;  Service: Cardiovascular;  Laterality: Right;   POLYPECTOMY N/A 12/29/2015   Procedure: POLYPECTOMY;  Surgeon: Midge Minium, MD;  Location: [facility] SUR[facility]ERY [facility];  Service: Endoscopy;  Laterality: N/A;   POLYPECTOMY  01/17/2017   Procedure: POLYPECTOMY;  Surgeon: Midge Minium, MD;  Location: [facility];  Service: Gastroenterology;;   POLYPECTOMY N/A 11/29/2019   Procedure: POLYPECTOMY;  Surgeon: Midge Minium, MD;  Location: [facility] SURGERY CNTR;  Service: Endoscopy;  Laterality: N/A;   SHOULDER SURGERY Right 09/20/2009   TOTAL HIP ARTHROPLASTY Right 12/21/2020   Procedure: TOTAL HIP ARTHROPLASTY ANTERIOR APPROACH;  Surgeon: Kennedy Bucker, MD;  Location: [facility] ORS;  Service: Orthopedics;  Laterality:  Right;    Social History   Tobacco Use   Smoking status: Former    Current packs/day: 0.00    Average packs/day: 2.0 packs/day for 30.0 years (60.0 ttl pk-yrs)    Types: Cigarettes    Start date: 42    Quit date: 2000    Years since quitting: 24.9   Smokeless tobacco: Current    Types: Chew  Vaping Use   Vaping status: Never Used  Substance Use Topics   Alcohol use: Yes    Alcohol/week: 12.0 standard drinks of alcohol    Types: 12 Cans of beer per week    Comment: BEER once a week   Drug use: No     Medication list has been reviewed and updated.  Current Meds  Medication Sig   amLODipine-benazepril (LOTREL) 10-20 MG capsule Take 1 capsule by mouth daily.   aspirin 81 MG chewable tablet Chew 1 tablet (81 mg total) by mouth daily.   atorvastatin (LIPITOR) 40 MG tablet Take 1 tablet (40 mg total) by mouth daily. TAKE 1 TABLET BY MOUTH EVERY DAY   bisacodyl (DULCOLAX) 5 MG EC tablet Take 2 tablets (10 mg total) by mouth at bedtime.   buPROPion (WELLBUTRIN XL) 300 MG 24 hr tablet Take 300 mg by mouth daily.  busPIRone (BUSPAR) 30 MG tablet Take 30 mg by mouth 2 (two) times daily.    clopidogrel (PLAVIX) 75 MG tablet Take 1 tablet (75 mg total) by mouth daily with breakfast.   cyclobenzaprine (FLEXERIL) 5 MG tablet Take 1 tablet (5 mg total) by mouth at bedtime.   methylphenidate (RITALIN) 20 MG tablet Take 10-20 mg by mouth 3 (three) times daily with meals.   oxyCODONE-acetaminophen (PERCOCET) 5-325 MG tablet Take 1 tablet by mouth every 6 (six) hours as needed for severe pain (pain score 7-10).   pantoprazole (PROTONIX) 40 MG tablet Take 1 tablet (40 mg total) by mouth 2 (two) times daily. Decrease to daily dosing after 30 days   polyethylene glycol (MIRALAX / GLYCOLAX) 17 g packet Take 17 g by mouth 2 (two) times daily.       03/07/2023    1:25 PM 11/15/2022    1:50 PM 05/17/2022    3:08 PM 11/16/2021   10:04 AM  GAD 7 : Generalized Anxiety Score  Nervous, Anxious, on  Edge 0 0 0 1  Control/stop worrying 0 0 0 1  Worry too much - different things 0 0 0 0  Trouble relaxing 0 0 0 0  Restless 0 0 0 0  Easily annoyed or irritable 0 0 0 1  Afraid - awful might happen 0 0 0 0  Total GAD 7 Score 0 0 0 3  Anxiety Difficulty Not difficult at all Not difficult at all Not difficult at all Not difficult at all       03/07/2023    1:25 PM 11/15/2022    1:50 PM 05/17/2022    3:08 PM  Depression screen PHQ 2/9  Decreased Interest 0 0 1  Down, Depressed, Hopeless 0 0 1  PHQ - 2 Score 0 0 2  Altered sleeping 0 0 0  Tired, decreased energy 1 0 0  Change in appetite 0 0 0  Feeling bad or failure about yourself  0 0 0  Trouble concentrating 0 0 0  Moving slowly or fidgety/restless 0 0 0  Suicidal thoughts 0 0 0  PHQ-9 Score 1 0 2  Difficult doing work/chores Not difficult at all Not difficult at all Not difficult at all    BP Readings from Last 3 Encounters:  03/09/23 (!) 143/76  03/07/23 122/70  03/06/23 (!) 140/78    Physical Exam Vitals and nursing note reviewed.  HENT:     Head: Normocephalic.     Right Ear: External ear normal.     Left Ear: External ear normal.     Nose: Nose normal.  Eyes:     General: No scleral icterus.       Right eye: No discharge.        Left eye: No discharge.     Conjunctiva/sclera: Conjunctivae normal.     Pupils: Pupils are equal, round, and reactive to light.  Neck:     Thyroid: No thyromegaly.     Vascular: No JVD.     Trachea: No tracheal deviation.  Cardiovascular:     Rate and Rhythm: Normal rate and regular rhythm.     Heart sounds: Normal heart sounds. No murmur heard.    No friction rub. No gallop.  Pulmonary:     Effort: No respiratory distress.     Breath sounds: Normal breath sounds. No wheezing or rales.  Abdominal:     General: Bowel sounds are normal.     Palpations: Abdomen is soft. There is  no mass.     Tenderness: There is no abdominal tenderness. There is no guarding or rebound.   Musculoskeletal:        General: No tenderness. Normal range of motion.     Cervical back: Normal range of motion and neck supple.  Lymphadenopathy:     Cervical: No cervical adenopathy.  Skin:    General: Skin is warm.     Findings: No rash.  Neurological:     Mental Status: He is alert and oriented to person, place, and time.     Cranial Nerves: No cranial nerve deficit.     Deep Tendon Reflexes: Reflexes are normal and symmetric.     Wt Readings from Last 3 Encounters:  03/08/23 182 lb 15.7 oz (83 kg)  03/07/23 183 lb (83 kg)  03/06/23 183 lb (83 kg)    BP 122/70   Pulse 72   Temp 97.8 F (36.6 C) (Oral)   Ht 5\' 3"  (1.6 m)   Wt 183 lb (83 kg)   SpO2 100%   BMI 32.42 kg/m  [facility]IC[facility] AT [facility]                                   Transitional Care [facility] Discharge Acute Issues Care Follow Up                                                                        Patient Demographics  Alan Trevino, is a 62 y.o. male  DOB 1961-04-01  MRN [id number].  Primary MD  Duanne Limerick, MD   Reason for TCC follow Up -follow-up with ischemic leg for evaluation status post stenting and stabilization of procedure.   Past Medical History:  Diagnosis Date   Arthritis    Depression    GERD (gastroesophageal reflux disease)    H/O ulcer disease    Headache    MIGRAINES   Hypertension    SBO (small bowel obstruction) (HCC)    Wears dentures    full upper    Past Surgical History:  Procedure Laterality Date   COLONOSCOPY WITH PROPOFOL N/A 12/29/2015   Procedure: COLONOSCOPY WITH PROPOFOL;  Surgeon: Midge Minium, MD;  Location: [facility];  Service: Endoscopy;  Laterality: N/A;   COLONOSCOPY WITH PROPOFOL N/A 11/29/2019   Procedure: COLONOSCOPY WITH PROPOFOL;  Surgeon: Midge Minium, MD;  Location: [facility];  Service: Endoscopy;  Laterality: N/A;  priority 4    ESOPHAGEAL DILATION     stretching   ESOPHAGEAL DILATION  01/17/2017   Procedure: ESOPHAGEAL DILATION;  Surgeon: Midge Minium, MD;  Location: [facility];  Service: Gastroenterology;;   ESOPHAGOGASTRODUODENOSCOPY (EGD) WITH PROPOFOL N/A 11/29/2016   Procedure: ESOPHAGOGASTRODUODENOSCOPY (EGD) WITH PROPOFOL;  Surgeon: Midge Minium, MD;  Location: [facility] ENDOSCOPY;  Service: Endoscopy;  Laterality: N/A;   ESOPHAGOGASTRODUODENOSCOPY (EGD) WITH PROPOFOL N/A 01/17/2017   Procedure: ESOPHAGOGASTRODUODENOSCOPY (EGD) WITH PROPOFOL;  Surgeon: Midge Minium, MD;  Location: [facility] SURGERY CNTR;  Service: Gastroenterology;  Laterality: N/A;   HERNIA REPAIR Right    INGUINAL HERNIA REPAIR Left  01/14/2020   Procedure: HERNIA REPAIR INGUINAL ADULT;  Surgeon: Earline Mayotte, MD;  Location: [facility] ORS;  Service: General;  Laterality: Left;   LOWER EXTREMITY ANGIOGRAPHY Right 02/28/2023   Procedure: Lower Extremity Angiography;  Surgeon: Renford Dills, MD;  Location: [facility] INVASIVE CV LAB;  Service: Cardiovascular;  Laterality: Right;   POLYPECTOMY N/A 12/29/2015   Procedure: POLYPECTOMY;  Surgeon: Midge Minium, MD;  Location: [facility] SURGERY CNTR;  Service: Endoscopy;  Laterality: N/A;   POLYPECTOMY  01/17/2017   Procedure: POLYPECTOMY;  Surgeon: Midge Minium, MD;  Location: [facility] SURGERY [facility];  Service: Gastroenterology;;   POLYPECTOMY N/A 11/29/2019   Procedure: POLYPECTOMY;  Surgeon: Midge Minium, MD;  Location: [facility];  Service: Endoscopy;  Laterality: N/A;   SHOULDER SURGERY Right 09/20/2009   TOTAL HIP ARTHROPLASTY Right 12/21/2020   Procedure: TOTAL HIP ARTHROPLASTY ANTERIOR APPROACH;  Surgeon: Kennedy Bucker, MD;  Location: [facility] ORS;  Service: Orthopedics;  Laterality: Right;  Hospital course patient is doing well status post discharge from stent to SFA of the right leg.  Decreased pain without fever or chills.  Area looks cyanotic but there is good pulse at the popliteal.  Post  Hospital acute care issue to be followed in clinic follow-up for patency of stent and continuance of recovery of ischemic limb.    Subjective:   Louay Tennessen today has, No headache, No chest pain, No abdominal pain - No Nausea, No new weakness tingling or numbness, No Cough - SOB.  No fever chills no significant increase in discomfort to right lower leg.  Assessment & Plan    1. Cellulitis of great toe of right foot Gradual improvement of the cellulitic area of the great toe.  There is mild tenderness there is moisture under the toe which may be infected patient has been instructed to go to the emergency room for evaluation of possible infection and perhaps need to have podiatry involved for emergence removal of nail and cleaning of the area above.  2. Ischemic toe New onset.  Resolving.  Status post stent to SFA.  Patient is slowly recovering but there may be some setback of infection.  3. Contusion of right buttock This patient's luck would have it he was getting the mail and a dog ran out and bit him.  There is a contusional area but no significant break of skin of canine insertion.  Will continue to monitor.    Reason for frequent admissions/ER visits patient may have infection of the area of local cellulitis and may need podiatry consult prior to anticipated appointment.      Objective:   Vitals:   03/07/23 1316  BP: 122/70  Pulse: 72  Temp: 97.8 F (36.6 C)  TempSrc: Oral  SpO2: 100%  Weight: 183 lb (83 kg)  Height: 5\' 3"  (1.6 m)    Wt Readings from Last 3 Encounters:  03/08/23 182 lb 15.7 oz (83 kg)  03/07/23 183 lb (83 kg)  03/06/23 183 lb (83 kg)    Allergies as of 03/07/2023       Reactions   Nsaids    Other Reaction(s): gi bleed        Medication List        Accurate as of March 07, 2023 11:59 PM. If you have any questions, ask your nurse or doctor.          amLODipine-benazepril 10-20 MG capsule Commonly known as: LOTREL Take 1  capsule by mouth daily.   aspirin 81 MG chewable  tablet Chew 1 tablet (81 mg total) by mouth daily.   atorvastatin 40 MG tablet Commonly known as: LIPITOR Take 1 tablet (40 mg total) by mouth daily. TAKE 1 TABLET BY MOUTH EVERY DAY   bisacodyl 5 MG EC tablet Commonly known as: DULCOLAX Take 2 tablets (10 mg total) by mouth at bedtime.   buPROPion 300 MG 24 hr tablet Commonly known as: WELLBUTRIN XL Take 300 mg by mouth daily.   busPIRone 30 MG tablet Commonly known as: BUSPAR Take 30 mg by mouth 2 (two) times daily.   clopidogrel 75 MG tablet Commonly known as: PLAVIX Take 1 tablet (75 mg total) by mouth daily with breakfast.   cyclobenzaprine 5 MG tablet Commonly known as: FLEXERIL Take 1 tablet (5 mg total) by mouth at bedtime.   methylphenidate 20 MG tablet Commonly known as: RITALIN Take 10-20 mg by mouth 3 (three) times daily with meals.   oxyCODONE-acetaminophen 5-325 MG tablet Commonly known as: Percocet Take 1 tablet by mouth every 6 (six) hours as needed for severe pain (pain score 7-10).   pantoprazole 40 MG tablet Commonly known as: PROTONIX Take 1 tablet (40 mg total) by mouth 2 (two) times daily. Decrease to daily dosing after 30 days   polyethylene glycol 17 g packet Commonly known as: MIRALAX / GLYCOLAX Take 17 g by mouth 2 (two) times daily.         Physical Exam: Constitutional: Patient appears well-developed and well-nourished. Not in obvious distress. HENT: Normocephalic, atraumatic, External right and left ear normal. Oropharynx is clear and moist.  Eyes: Conjunctivae and EOM are normal. PERRLA, no scleral icterus. Neck: Normal ROM. Neck supple. No JVD. No tracheal deviation. No thyromegaly. CVS: RRR, S1/S2 +, no murmurs, no gallops, no carotid bruit.  Pulmonary: Effort and breath sounds normal, no stridor, rhonchi, wheezes, rales.  Abdominal: Soft. BS +, no distension, tenderness, rebound or guarding.  Musculoskeletal: Normal range of  motion. No edema and no tenderness.  Lymphadenopathy: No lymphadenopathy noted, cervical, inguinal or axillary Neuro: Alert. Normal reflexes, muscle tone coordination. No cranial nerve deficit. Skin: Skin is warm and dry. No rash noted. Not diaphoretic. No erythema. No pallor. Psychiatric: Normal mood and affect. Behavior, judgment, thought content normal.   Data Review   Micro Results No results found for this or any previous visit (from the past 240 hour(s)).   CBC Recent Labs  Lab 03/08/23 1914  WBC 10.7*  HGB 15.5  HCT 44.8  PLT 277  MCV 93.3  MCH 32.3  MCHC 34.6  RDW 11.6  LYMPHSABS 1.8  MONOABS 0.9  EOSABS 0.4  BASOSABS 0.1    Chemistries  Recent Labs  Lab 03/08/23 1914  NA 135  K 3.9  CL 103  CO2 23  GLUCOSE 115*  BUN 17  CREATININE 1.32*  CALCIUM 9.2  AST 26  ALT 27  ALKPHOS 65  BILITOT 0.5   ------------------------------------------------------------------------------------------------------------------ estimated creatinine clearance is 54.5 mL/min (A) (by C-G formula based on SCr of 1.32 mg/dL (H)). ------------------------------------------------------------------------------------------------------------------ No results for input(s): "HGBA1C" in the last 72 hours. ------------------------------------------------------------------------------------------------------------------ No results for input(s): "CHOL", "HDL", "LDLCALC", "TRIG", "CHOLHDL", "LDLDIRECT" in the last 72 hours. ------------------------------------------------------------------------------------------------------------------ No results for input(s): "TSH", "T4TOTAL", "T3FREE", "THYROIDAB" in the last 72 hours.  Invalid input(s): "FREET3" ------------------------------------------------------------------------------------------------------------------ No results for input(s): "VITAMINB12", "FOLATE", "FERRITIN", "TIBC", "IRON", "RETICCTPCT" in the last 72 hours.  Coagulation  profile No results for input(s): "INR", "PROTIME" in the last 168 hours.  No results for input(s): "DDIMER" in  the last 72 hours.  Cardiac Enzymes No results for input(s): "CKMB", "TROPONINI", "MYOGLOBIN" in the last 168 hours.  Invalid input(s): "CK" ------------------------------------------------------------------------------------------------------------------ Invalid input(s): "POCBNP" Time spent in minutes: 30     Elizabeth Sauer M.D on 03/11/2023 at 1:19 PM   Disclaimer: This note may have been dictated with voice recognition software. Similar sounding words can inadvertently be transcribed and this note may contain transcription errors which may not have been corrected upon publication of note.        Elizabeth Sauer, MD

## 2023-03-11 NOTE — Addendum Note (Signed)
Addended by: Elizabeth Sauer C on: 03/11/2023 01:27 PM   Modules accepted: Level of Service

## 2023-03-11 NOTE — Telephone Encounter (Signed)
Patient called wanting a note to be out of work this week. He stated Dr.Schneir gave him a note last week to return to work this week, but he feels like he jumped ahead and he's not ready yet.   Please advise

## 2023-03-11 NOTE — Telephone Encounter (Signed)
Patient dropped off FMLA paperwork. Placed in nurses box. Please call patient when ready for pickup

## 2023-03-12 NOTE — Telephone Encounter (Signed)
Patient asked if he could also get a release letter to return back to work sent through his MyChart.

## 2023-03-14 ENCOUNTER — Encounter (INDEPENDENT_AMBULATORY_CARE_PROVIDER_SITE_OTHER): Payer: Self-pay | Admitting: Nurse Practitioner

## 2023-03-14 ENCOUNTER — Encounter (INDEPENDENT_AMBULATORY_CARE_PROVIDER_SITE_OTHER): Payer: Self-pay | Admitting: Vascular Surgery

## 2023-03-25 ENCOUNTER — Ambulatory Visit
Admission: EM | Admit: 2023-03-25 | Discharge: 2023-03-25 | Disposition: A | Discharge status: Home / Self Care | Payer: BC Managed Care – PPO | Attending: Physician Assistant | Admitting: Physician Assistant

## 2023-03-25 ENCOUNTER — Ambulatory Visit: Payer: BC Managed Care – PPO

## 2023-03-25 DIAGNOSIS — N410 Acute prostatitis: Secondary | ICD-10-CM | POA: Diagnosis not present

## 2023-03-25 DIAGNOSIS — R0602 Shortness of breath: Secondary | ICD-10-CM | POA: Insufficient documentation

## 2023-03-25 DIAGNOSIS — R3915 Urgency of urination: Secondary | ICD-10-CM | POA: Diagnosis not present

## 2023-03-25 DIAGNOSIS — R509 Fever, unspecified: Secondary | ICD-10-CM | POA: Insufficient documentation

## 2023-03-25 DIAGNOSIS — R051 Acute cough: Secondary | ICD-10-CM | POA: Insufficient documentation

## 2023-03-25 LAB — URINALYSIS, W/ REFLEX TO CULTURE (INFECTION SUSPECTED)
Glucose, UA: 100 mg/dL — AB
Leukocytes,Ua: NEGATIVE
Nitrite: NEGATIVE
Protein, ur: 100 mg/dL — AB
RBC / HPF: >50 RBC/hpf (ref 0–5)
Specific Gravity, Urine: >1.03 — ABNORMAL HIGH (ref 1.005–1.030)
pH: 5.5 (ref 5.0–8.0)

## 2023-03-25 LAB — SARS CORONAVIRUS 2 BY RT PCR: SARS Coronavirus 2 by RT PCR: NEGATIVE

## 2023-03-25 MED ORDER — CIPROFLOXACIN HCL 500 MG PO TABS: 500.0000 mg | ORAL_TABLET | Freq: Two times a day (BID) | ORAL | 0 refills | Status: AC

## 2023-03-25 NOTE — ED Triage Notes (Signed)
Pt c/o urinary freq & fatigue x3 days. Denies any hematuria.

## 2023-03-25 NOTE — ED Provider Notes (Signed)
[facility]    CSN: [id number] Arrival date & time: 03/25/23  1329      History   Chief Complaint Chief Complaint  Patient presents with   Dysuria   Fatigue    HPI Alan Trevino is a 62 y.o. male presenting for 3-day history of low grade fever, fatigue, urinary frequency/urgency/dysuria, soft stools, cough and SOB.  Denies sore throat, nasal congestion, chest pain, flank pain, hematuria, abdominal pain, vomiting.  No report of penile discharge, testicular pain or concern for STIs reported.  No sick contacts.  Has been taking antipyretics for fever but has not had anything in several hours.  HPI  Past Medical History:  Diagnosis Date   Arthritis    Depression    GERD (gastroesophageal reflux disease)    H/O ulcer disease    Headache    MIGRAINES   Hypertension    SBO (small bowel obstruction) (HCC)    Wears dentures    full upper    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   History of colonic polyps    Acute peptic ulcer without hemorrhage and perforation    Hiatal hernia    Esophagogastric ulcer    Stricture and stenosis of esophagus    GIB (gastrointestinal bleeding) 11/30/2016   Hematemesis without nausea    Acute esophagogastric ulcer    Upper GI bleed 11/28/2016   Acute recurrent maxillary sinusitis 09/07/2016   Special screening for malignant neoplasms, colon    Benign neoplasm of ascending colon    Benign neoplasm of transverse colon    Benign neoplasm of descending colon    Rectal polyp    Lumbar disc disease 11/03/2015   Iron deficiency anemia 08/25/2014   Acute gastrointestinal bleeding 08/25/2014   Acute back pain with sciatica 08/25/2014    Routine general medical examination at a health care facility 08/25/2014   DDD (degenerative disc disease), lumbosacral 08/25/2014   Recurrent major depressive episodes (HCC) 08/25/2014   Essential (primary) hypertension 08/25/2014   HLD (hyperlipidemia) 08/25/2014    Past Surgical History:  Procedure Laterality Date   COLONOSCOPY WITH PROPOFOL N/A 12/29/2015   Procedure: COLONOSCOPY WITH PROPOFOL;  Surgeon: Midge Minium, MD;  Location: [facility];  Service: Endoscopy;  Laterality: N/A;   COLONOSCOPY WITH PROPOFOL N/A 11/29/2019   Procedure: COLONOSCOPY WITH PROPOFOL;  Surgeon: Midge Minium, MD;  Location: [facility];  Service: Endoscopy;  Laterality: N/A;  priority 4   ESOPHAGEAL DILATION     stretching   ESOPHAGEAL DILATION  01/17/2017   Procedure: ESOPHAGEAL DILATION;  Surgeon: Midge Minium, MD;  Location: [facility];  Service: Gastroenterology;;   ESOPHAGOGASTRODUODENOSCOPY (EGD) WITH PROPOFOL N/A 11/29/2016   Procedure: ESOPHAGOGASTRODUODENOSCOPY (EGD) WITH PROPOFOL;  Surgeon: Midge Minium, MD;  Location: [facility] ENDOSCOPY;  Service: Endoscopy;  Laterality: N/A;   ESOPHAGOGASTRODUODENOSCOPY (EGD) WITH PROPOFOL N/A 01/17/2017   Procedure: ESOPHAGOGASTRODUODENOSCOPY (EGD) WITH PROPOFOL;  Surgeon: Midge Minium, MD;  Location: [facility];  Service: Gastroenterology;  Laterality: N/A;   HERNIA REPAIR Right    INGUINAL HERNIA REPAIR Left 01/14/2020   Procedure: HERNIA REPAIR INGUINAL ADULT;  Surgeon: Earline Mayotte, MD;  Location: [facility] ORS;  Service: General;  Laterality: Left;  LOWER EXTREMITY ANGIOGRAPHY Right 02/28/2023   Procedure: Lower Extremity Angiography;  Surgeon: Renford Dills, MD;  Location: [facility] INVASIVE CV LAB;  Service: Cardiovascular;  Laterality: Right;   POLYPECTOMY N/A 12/29/2015   Procedure: POLYPECTOMY;  Surgeon: Midge Minium, MD;  Location: [facility];  Service: Endoscopy;  Laterality: N/A;   POLYPECTOMY  01/17/2017    Procedure: POLYPECTOMY;  Surgeon: Midge Minium, MD;  Location: [facility];  Service: Gastroenterology;;   POLYPECTOMY N/A 11/29/2019   Procedure: POLYPECTOMY;  Surgeon: Midge Minium, MD;  Location: [facility];  Service: Endoscopy;  Laterality: N/A;   SHOULDER SURGERY Right 09/20/2009   TOTAL HIP ARTHROPLASTY Right 12/21/2020   Procedure: TOTAL HIP ARTHROPLASTY ANTERIOR APPROACH;  Surgeon: Kennedy Bucker, MD;  Location: [facility] ORS;  Service: Orthopedics;  Laterality: Right;       Home Medications    Prior to Admission medications   Medication Sig Start Date End Date Taking? Authorizing Provider  amLODipine-benazepril (LOTREL) 10-20 MG capsule Take 1 capsule by mouth daily. 11/15/22  Yes Duanne Limerick, MD  aspirin 81 MG chewable tablet Chew 1 tablet (81 mg total) by mouth daily. 03/04/23 03/03/24 Yes Gillis Santa, MD  atorvastatin (LIPITOR) 40 MG tablet Take 1 tablet (40 mg total) by mouth daily. TAKE 1 TABLET BY MOUTH EVERY DAY 03/03/23 03/02/24 Yes Gillis Santa, MD  bisacodyl (DULCOLAX) 5 MG EC tablet Take 2 tablets (10 mg total) by mouth at bedtime. 03/03/23  Yes Gillis Santa, MD  buPROPion (WELLBUTRIN XL) 300 MG 24 hr tablet Take 300 mg by mouth daily.  10/11/13  Yes [provider]  busPIRone (BUSPAR) 30 MG tablet Take 30 mg by mouth 2 (two) times daily.    Yes [provider]  ciprofloxacin (CIPRO) 500 MG tablet Take 1 tablet (500 mg total) by mouth every 12 (twelve) hours for 10 days. 03/25/23 04/04/23 Yes Shirlee Latch, PA-C  clopidogrel (PLAVIX) 75 MG tablet Take 1 tablet (75 mg total) by mouth daily with breakfast. 03/04/23 03/03/24 Yes Gillis Santa, MD  cyclobenzaprine (FLEXERIL) 5 MG tablet Take 1 tablet (5 mg total) by mouth at bedtime. 11/15/22  Yes Duanne Limerick, MD  methylphenidate (RITALIN) 20 MG tablet Take 10-20 mg by mouth 3 (three) times daily with meals.   Yes [provider]  oxyCODONE-acetaminophen (PERCOCET) 5-325 MG tablet  Take 1 tablet by mouth every 6 (six) hours as needed for severe pain (pain score 7-10). 03/06/23  Yes Schnier, Latina Craver, MD  pantoprazole (PROTONIX) 40 MG tablet Take 1 tablet (40 mg total) by mouth 2 (two) times daily. Decrease to daily dosing after 30 days 03/03/23 03/02/24 Yes Gillis Santa, MD  polyethylene glycol (MIRALAX / GLYCOLAX) 17 g packet Take 17 g by mouth 2 (two) times daily. 03/03/23  Yes Gillis Santa, MD    Family History Family History  Problem Relation Age of Onset   Ovarian cancer Mother    CAD Father     Social History Social History   Tobacco Use   Smoking status: Former    Current packs/day: 0.00    Average packs/day: 2.0 packs/day for 30.0 years (60.0 ttl pk-yrs)    Types: Cigarettes    Start date: 52    Quit date: 2000    Years since quitting: 24.9   Smokeless tobacco: Current    Types: Chew  Vaping Use   Vaping status: Never Used  Substance Use Topics   Alcohol use: Yes    Alcohol/week: 12.0 standard drinks  of alcohol    Types: 12 Cans of beer per week    Comment: BEER once a week   Drug use: No     Allergies   Nsaids   Review of Systems Review of Systems  Constitutional:  Positive for fatigue and fever.  HENT:  Negative for congestion, rhinorrhea and sore throat.   Eyes:  Negative for discharge.  Respiratory:  Positive for cough and shortness of breath.   Cardiovascular:  Negative for chest pain.  Gastrointestinal:  Positive for nausea. Negative for abdominal pain, constipation, diarrhea and vomiting.  Genitourinary:  Positive for dysuria, frequency and urgency. Negative for genital sores, hematuria, penile discharge, penile pain, penile swelling, scrotal swelling and testicular pain.  Musculoskeletal:  Negative for arthralgias and back pain.  Skin:  Negative for rash.  Neurological:  Negative for weakness.     Physical Exam Triage Vital Signs ED Triage Vitals  Encounter Vitals Group     BP      Systolic BP Percentile       Diastolic BP Percentile      Pulse      Resp      Temp      Temp src      SpO2      Weight      Height      Head Circumference      Peak Flow      Pain Score      Pain Loc      Pain Education      Exclude from Growth Chart    No data found.  Updated Vital Signs BP (!) 164/77 (BP Location: Left Arm)   Pulse 87   Temp (!) 100.5 F (38.1 C) (Oral)   Resp 16   Ht 5\' 3"  (1.6 m)   Wt 182 lb 15.7 oz (83 kg)   SpO2 94%   BMI 32.41 kg/m    Physical Exam Vitals and nursing note reviewed.  Constitutional:      General: He is not in acute distress.    Appearance: Normal appearance. He is well-developed. He is ill-appearing.  HENT:     Head: Normocephalic and atraumatic.     Nose: Nose normal.     Mouth/Throat:     Mouth: Mucous membranes are moist.     Pharynx: Oropharynx is clear.  Eyes:     General: No scleral icterus.    Conjunctiva/sclera: Conjunctivae normal.  Cardiovascular:     Rate and Rhythm: Normal rate and regular rhythm.     Heart sounds: Normal heart sounds.  Pulmonary:     Effort: Pulmonary effort is normal. No respiratory distress.     Breath sounds: Normal breath sounds.  Abdominal:     Palpations: Abdomen is soft.     Tenderness: There is no abdominal tenderness. There is no right CVA tenderness or left CVA tenderness.  Musculoskeletal:     Cervical back: Neck supple.  Skin:    General: Skin is warm and dry.     Capillary Refill: Capillary refill takes less than 2 seconds.  Neurological:     General: No focal deficit present.     Mental Status: He is alert. Mental status is at baseline.     Motor: No weakness.     Gait: Gait normal.  Psychiatric:        Mood and Affect: Mood normal.        Behavior: Behavior normal.      UC Treatments /  Results  Labs (all labs ordered are listed, but only abnormal results are displayed) Labs Reviewed  URINALYSIS, W/ REFLEX TO CULTURE (INFECTION SUSPECTED) - Abnormal; Notable for the following components:       Result Value   APPearance HAZY (*)    Specific Gravity, Urine >1.030 (*)    Glucose, UA 100 (*)    Hgb urine dipstick MODERATE (*)    Bilirubin Urine SMALL (*)    Ketones, ur TRACE (*)    Protein, ur 100 (*)    Bacteria, UA RARE (*)    All other components within normal limits  SARS CORONAVIRUS 2 BY RT PCR  URINE CULTURE    EKG   Radiology DG Abdomen 1 View  Result Date: 03/25/2023 CLINICAL DATA:  Urinary frequency.  Fatigue for 3 days. EXAM: ABDOMEN - 1 VIEW COMPARISON:  CT, 02/03/2010. FINDINGS: Normal bowel gas pattern. No evidence of renal or ureteral stones. Scattered iliac artery vascular calcifications. Partly imaged right total hip arthroplasty appears well seated and aligned. No acute skeletal abnormality. IMPRESSION: 1. No acute findings.  Normal bowel gas pattern. Electronically Signed   By: Amie Portland M.D.   On: 03/25/2023 15:36   DG Chest 2 View  Result Date: 03/25/2023 CLINICAL DATA:  Cough, fever and shortness of breath. EXAM: CHEST - 2 VIEW COMPARISON:  01/30/2018. FINDINGS: Cardiac silhouette is normal in size. Small to moderate-sized hiatal hernia. No mediastinal or hilar masses. No evidence of adenopathy. Lungs are hyperexpanded, but clear. No pleural effusion or pneumothorax. Skeletal structures are intact. IMPRESSION: No active cardiopulmonary disease. Electronically Signed   By: Amie Portland M.D.   On: 03/25/2023 15:35    Procedures Procedures (including critical care time)  Medications Ordered in UC Medications - No data to display  Initial Impression / Assessment and Plan / UC Course  I have reviewed the triage vital signs and the nursing notes.  Pertinent labs & imaging results that were available during my care of the patient were reviewed by me and considered in my medical decision making (see chart for details).   62 year old male presents for fever, dysuria/urinary frequency/urgency, soft stools, cough, SOB and fatigue x 3 days.  Temp  100.5 degrees.  On exam abdomen soft and nontender.  No CVA tenderness.  Chest clear.  Urinalysis obtained.  UA shows greater than 1.030 specific gravity, glucose, moderate hemoglobin, bilirubin, ketones, protein, bacteria.  Will send urine for culture.  Chest x-ray obtained to assess for possible pneumonia given fever, cough and complaints of shortness of breath.  KUB ordered given microscopic hemoglobin on urinalysis, urinary frequency complaints and amorphous crystals seen on urinalysis.  Suspect possible kidney stone.  COVID test performed and negative.  Chest x-ray and KUB were negative.  Reviewed all results with patient.  Given that the workup is negative, clinical presentation seems to most likely suggest acute prostatitis.  He is presenting with low-grade temps, some mild nausea, fatigue, aches, dysuria, frequency and urgency.  Suspect he also may have viral URI.  Suggested treatment with antibiotics at this time.  He is agreeable.  Sent Cipro to pharmacy.  Encouraged him to increase his rest and fluid intake.  Explained that he is a little dehydrated.  Explained to patient that he should be hopefully breaking his fever and starting to feel better and regain some energy in the next 2 to 3 days.  If he is not or symptoms worsen in any way he should have a low threshold to go to  the emergency department.  Patient is agreeable.  Acute illness with systemic symptoms.   Final Clinical Impressions(s) / UC Diagnoses   Final diagnoses:  Acute prostatitis  Fever, unspecified  Urinary urgency  Acute cough  Shortness of breath     Discharge Instructions      -You are negative for COVID and your chest x-ray was negative for pneumonia. - The urinalysis was not consistent with a urinary tract infection.  I am going to send it for culture. - I have concerns that your symptoms are consistent with acute bacterial prostate infection.  I sent antibiotics to the pharmacy. -You should be  breaking this fever in the next 2 days or so.  If you are not or you are feeling worse or any new symptoms develop you need to go to the emergency department for further evaluation.  If you begin to have higher fever, flank pain, chest pain, increased breathing difficulty, abdominal pain, blood in urine, increased weakness please do not hesitate to go ER                                                                                                                                                             ED Prescriptions     Medication Sig Dispense Auth. Provider   ciprofloxacin (CIPRO) 500 MG tablet Take 1 tablet (500 mg total) by mouth every 12 (twelve) hours for 10 days. 20 tablet Gareth Morgan      PDMP not reviewed this encounter.   Shirlee Latch, PA-C 03/25/23 [phone]

## 2023-03-25 NOTE — Discharge Instructions (Addendum)
-  You are negative for COVID and your chest x-ray was negative for pneumonia. - The urinalysis was not consistent with a urinary tract infection.  I am going to send it for culture. - I have concerns that your symptoms are consistent with acute bacterial prostate infection.  I sent antibiotics to the pharmacy. -You should be breaking this fever in the next 2 days or so.  If you are not or you are feeling worse or any new symptoms develop you need to go to the emergency department for further evaluation.  If you begin to have higher fever, flank pain, chest pain, increased breathing difficulty, abdominal pain, blood in urine, increased weakness please do not hesitate to go ER

## 2023-03-26 ENCOUNTER — Telehealth (INDEPENDENT_AMBULATORY_CARE_PROVIDER_SITE_OTHER): Payer: Self-pay

## 2023-03-26 NOTE — Telephone Encounter (Signed)
Error- was trying to print labels

## 2023-03-27 ENCOUNTER — Encounter: Payer: Self-pay | Admitting: Podiatry

## 2023-03-27 ENCOUNTER — Ambulatory Visit (INDEPENDENT_AMBULATORY_CARE_PROVIDER_SITE_OTHER): Payer: BC Managed Care – PPO | Admitting: Podiatry

## 2023-03-27 VITALS — Ht 63.0 in | Wt 183.0 lb

## 2023-03-27 DIAGNOSIS — I75021 Atheroembolism of right lower extremity: Secondary | ICD-10-CM | POA: Diagnosis not present

## 2023-03-27 DIAGNOSIS — I999 Unspecified disorder of circulatory system: Secondary | ICD-10-CM

## 2023-03-27 LAB — URINE CULTURE: Culture: >=100000 — AB

## 2023-03-27 NOTE — Progress Notes (Signed)
Subjective:  Patient ID: Alan Trevino, male    DOB: February 29, 1961,  MRN: [id number]  Chief Complaint  Patient presents with   Foot Injury    Pt is here to f/u on right foot/toe problem pt states area around right greater toe still hurts.    62 y.o. male presents with the above complaint.  Patient presents with follow-up of right purple toe syndrome/vascular abnormality.  He states is looking a lot better.  He went to the hospital had angiogram done.   Review of Systems: Negative except as noted in the HPI. Denies N/V/F/Ch.  Past Medical History:  Diagnosis Date   Arthritis    Depression    GERD (gastroesophageal reflux disease)    H/O ulcer disease    Headache    MIGRAINES   Hypertension    SBO (small bowel obstruction) (HCC)    Wears dentures    full upper    Current Outpatient Medications:    amLODipine-benazepril (LOTREL) 10-20 MG capsule, Take 1 capsule by mouth daily., Disp: 90 capsule, Rfl: 1   aspirin 81 MG chewable tablet, Chew 1 tablet (81 mg total) by mouth daily., Disp: 30 tablet, Rfl: 11   atorvastatin (LIPITOR) 40 MG tablet, Take 1 tablet (40 mg total) by mouth daily. TAKE 1 TABLET BY MOUTH EVERY DAY, Disp: 30 tablet, Rfl: 11   bisacodyl (DULCOLAX) 5 MG EC tablet, Take 2 tablets (10 mg total) by mouth at bedtime., Disp: 30 tablet, Rfl: 0   buPROPion (WELLBUTRIN XL) 300 MG 24 hr tablet, Take 300 mg by mouth daily. , Disp: , Rfl:    busPIRone (BUSPAR) 30 MG tablet, Take 30 mg by mouth 2 (two) times daily. , Disp: , Rfl:    ciprofloxacin (CIPRO) 500 MG tablet, Take 1 tablet (500 mg total) by mouth every 12 (twelve) hours for 10 days., Disp: 20 tablet, Rfl: 0   clopidogrel (PLAVIX) 75 MG tablet, Take 1 tablet (75 mg total) by mouth daily with breakfast., Disp: 30 tablet, Rfl: 11   cyclobenzaprine (FLEXERIL) 5 MG tablet, Take 1 tablet (5 mg total) by mouth at bedtime., Disp: 90 tablet, Rfl: 1   methylphenidate (RITALIN) 20 MG tablet, Take 10-20 mg by mouth 3 (three)  times daily with meals., Disp: , Rfl:    oxyCODONE-acetaminophen (PERCOCET) 5-325 MG tablet, Take 1 tablet by mouth every 6 (six) hours as needed for severe pain (pain score 7-10)., Disp: 40 tablet, Rfl: 0   pantoprazole (PROTONIX) 40 MG tablet, Take 1 tablet (40 mg total) by mouth 2 (two) times daily. Decrease to daily dosing after 30 days, Disp: 60 tablet, Rfl: 11   polyethylene glycol (MIRALAX / GLYCOLAX) 17 g packet, Take 17 g by mouth 2 (two) times daily., Disp: 14 each, Rfl: 0  Social History   Tobacco Use  Smoking Status Former   Current packs/day: 0.00   Average packs/day: 2.0 packs/day for 30.0 years (60.0 ttl pk-yrs)   Types: Cigarettes   Start date: 30   Quit date: 2000   Years since quitting: 24.9  Smokeless Tobacco Current   Types: Chew    Allergies  Allergen Reactions   Nsaids     Other Reaction(s): gi bleed   Objective:  There were no vitals filed for this visit. Body mass index is 32.41 kg/m. Constitutional Well developed. Well nourished.  Vascular Dorsalis pedis pulses palpable right side Posterior tibial pulses palpable will Capillary refill improved Some cyanosis noted. Pedal hair no growth  Neurologic Normal speech. Oriented  to person, place, and time. Epicritic sensation to light touch grossly present bilaterally.  Dermatologic Nails well groomed and normal in appearance. No open wounds. No skin lesions.  Orthopedic: Manual muscle strength 5 out of 5   Radiographs: None Assessment:   1. 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 Vascular abnormality     Plan:  Patient was evaluated and treated and all questions answered.  Right purple toe syndrome/peripheral vascular disease -All questions and concerns were discussed with the patient in extensive detail -Clinically improved.  Patient underwent hospitalization where he went angiogram for revascularization after that his discoloration has improved considerably.  His toe has improved.  I  encouraged him to continue monitoring if any signs of poor circulation is noted to go to the emergency room right away he states understanding -Weightbearing as tolerated  No follow-ups on file.

## 2023-04-10 ENCOUNTER — Emergency Department: Payer: BC Managed Care – PPO

## 2023-04-10 ENCOUNTER — Encounter: Payer: Self-pay | Admitting: Emergency Medicine

## 2023-04-10 ENCOUNTER — Emergency Department
Admission: EM | Admit: 2023-04-10 | Discharge: 2023-04-10 | Disposition: A | Discharge status: Home / Self Care | Payer: BC Managed Care – PPO | Attending: Emergency Medicine | Admitting: Emergency Medicine

## 2023-04-10 ENCOUNTER — Ambulatory Visit: Admission: EM | Admit: 2023-04-10 | Discharge: 2023-04-10 | Payer: BC Managed Care – PPO

## 2023-04-10 ENCOUNTER — Other Ambulatory Visit: Payer: Self-pay

## 2023-04-10 DIAGNOSIS — M79604 Pain in right leg: Secondary | ICD-10-CM

## 2023-04-10 DIAGNOSIS — M7989 Other specified soft tissue disorders: Secondary | ICD-10-CM | POA: Diagnosis present

## 2023-04-10 DIAGNOSIS — R600 Localized edema: Secondary | ICD-10-CM

## 2023-04-10 LAB — CBC WITH DIFFERENTIAL/PLATELET
Abs Immature Granulocytes: 0.03 10*3/uL (ref 0.00–0.07)
Basophils Absolute: 0.1 10*3/uL (ref 0.0–0.1)
Basophils Relative: 1 %
Eosinophils Absolute: 0.3 10*3/uL (ref 0.0–0.5)
Eosinophils Relative: 3 %
HCT: 42.3 % (ref 39.0–52.0)
Hemoglobin: 13.8 g/dL (ref 13.0–17.0)
Immature Granulocytes: 0 %
Lymphocytes Relative: 18 %
Lymphs Abs: 1.7 10*3/uL (ref 0.7–4.0)
MCH: 31.7 pg (ref 26.0–34.0)
MCHC: 32.6 g/dL (ref 30.0–36.0)
MCV: 97.2 fL (ref 80.0–100.0)
Monocytes Absolute: 1 10*3/uL (ref 0.1–1.0)
Monocytes Relative: 10 %
Neutro Abs: 6.7 10*3/uL (ref 1.7–7.7)
Neutrophils Relative %: 68 %
Platelets: 284 10*3/uL (ref 150–400)
RBC: 4.35 MIL/uL (ref 4.22–5.81)
RDW: 12.5 % (ref 11.5–15.5)
WBC: 9.9 10*3/uL (ref 4.0–10.5)
nRBC: 0 % (ref 0.0–0.2)

## 2023-04-10 LAB — BASIC METABOLIC PANEL
Anion gap: 11 (ref 5–15)
BUN: 9 mg/dL (ref 8–23)
CO2: 26 mmol/L (ref 22–32)
Calcium: 9 mg/dL (ref 8.9–10.3)
Chloride: 105 mmol/L (ref 98–111)
Creatinine, Ser: 1.17 mg/dL (ref 0.61–1.24)
GFR, Estimated: >60 mL/min (ref >60)
Glucose, Bld: 98 mg/dL (ref 70–99)
Potassium: 3.4 mmol/L — ABNORMAL LOW (ref 3.5–5.1)
Sodium: 142 mmol/L (ref 135–145)

## 2023-04-10 MED ORDER — HYDROCHLOROTHIAZIDE 25 MG PO TABS: 25.0000 mg | ORAL_TABLET | Freq: Every day | ORAL | 0 refills | Status: DC

## 2023-04-10 NOTE — Discharge Instructions (Addendum)
Please use compression stockings and a short course of mild diuretics for the next 5 days Please follow-up with you're primary care physicians as well as your vascular surgeon as soon as possible

## 2023-04-10 NOTE — ED Provider Notes (Signed)
[facility] Provider Note   Event Date/Time   First MD Initiated Contact with Patient 04/10/23 2217     (approximate) History  Leg Swelling  HPI HOLDON Alan Trevino is a 62 y.o. male with a stated past medical history of peripheral arterial disease with recent catheterization of the right lower extremity approximately 1 month prior to arrival who presents complaining of bilateral lower extremity swelling worse on the right than on the left.  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  Patient also has associated tightness with pain but has not tried any medications for this pain. ROS: Patient currently denies any vision changes, tinnitus, difficulty speaking, facial droop, sore throat, chest pain, shortness of breath, abdominal pain, nausea/vomiting/diarrhea, dysuria, or weakness/numbness/paresthesias in any extremity   Physical Exam  Triage Vital Signs: ED Triage Vitals  Encounter Vitals Group     BP 04/10/23 1847 137/75     Systolic BP Percentile --      Diastolic BP Percentile --      Pulse Rate 04/10/23 1847 75     Resp 04/10/23 1847 18     Temp 04/10/23 1847 98.4 F (36.9 C)     Temp Source 04/10/23 1847 Oral     SpO2 04/10/23 1847 97 %     Weight 04/10/23 1846 180 lb (81.6 kg)     Height 04/10/23 1846 5\' 3"  (1.6 m)     Head Circumference --      Peak Flow --      Pain Score 04/10/23 1846 4     Pain Loc --      Pain Education --      Exclude from Growth Chart --    Most recent vital signs: Vitals:   04/10/23 1847  BP: 137/75  Pulse: 75  Resp: 18  Temp: 98.4 F (36.9 C)  SpO2: 97%   General: Awake, oriented x4. CV:  Good peripheral perfusion.  Resp:  Normal effort.  Abd:  No distention.  Other:  Elderly obese Caucasian male resting comfortably in no acute distress.  Bilateral lower extremity edema worsened on the right than the  left.  No evidence of ecchymosis, color change, or decreased sensation on the right extremity ED Results / Procedures / Treatments  Labs (all labs ordered are listed, but only abnormal results are displayed) Labs Reviewed  BASIC METABOLIC PANEL - Abnormal; Notable for the following components:      Result Value   Potassium 3.4 (*)    All other components within normal limits  CBC WITH DIFFERENTIAL/PLATELET   RADIOLOGY ED MD interpretation: Doppler ultrasound of the right lower extremity independently interpreted and shows no evidence of deep venous thrombosis -Agree with radiology assessment Official radiology report(s): US Venous Img Lower Unilateral Right Result Date: 04/10/2023 CLINICAL DATA:  Right leg pain and swelling for 1 week EXAM: RIGHT LOWER EXTREMITY VENOUS DOPPLER ULTRASOUND TECHNIQUE: Gray-scale sonography with graded compression, as well as color Doppler and duplex ultrasound were performed to evaluate the lower extremity deep venous systems from the level of the common femoral vein and including the common femoral, femoral, profunda femoral, popliteal and calf veins including the posterior tibial, peroneal and gastrocnemius veins when visible. The superficial great saphenous vein was also interrogated. Spectral Doppler was utilized to evaluate flow at rest and with distal augmentation maneuvers in the common femoral, femoral  and popliteal veins. COMPARISON:  None Available. FINDINGS: Contralateral Common Femoral Vein: Respiratory phasicity is normal and symmetric with the symptomatic side. No evidence of thrombus. Normal compressibility. Common Femoral Vein: No evidence of thrombus. Normal compressibility, respiratory phasicity and response to augmentation. Saphenofemoral Junction: No evidence of thrombus. Normal compressibility and flow on color Doppler imaging. Profunda Femoral Vein: No evidence of thrombus. Normal compressibility and flow on color Doppler imaging. Femoral Vein: No  evidence of thrombus. Normal compressibility, respiratory phasicity and response to augmentation. Popliteal Vein: No evidence of thrombus. Normal compressibility, respiratory phasicity and response to augmentation. Calf Veins: No evidence of thrombus. Normal compressibility and flow on color Doppler imaging. Superficial Great Saphenous Vein: No evidence of thrombus. Normal compressibility. Venous Reflux:  None. Other Findings:  None. IMPRESSION: No evidence of deep venous thrombosis. Electronically Signed   By: Alcide Clever M.D.   On: 04/10/2023 21:55   PROCEDURES: Critical Care performed: No .1-3 Lead EKG Interpretation  Performed by: Merwyn Katos, MD Authorized by: Merwyn Katos, MD     Interpretation: normal     ECG rate:  71   ECG rate assessment: normal     Rhythm: sinus rhythm     Ectopy: none     Conduction: normal    MEDICATIONS ORDERED IN ED: Medications - No data to display IMPRESSION / MDM / ASSESSMENT AND PLAN / ED COURSE  I reviewed the triage vital signs and the nursing notes.                             The patient is on the cardiac monitor to evaluate for evidence of arrhythmia and/or significant heart rate changes. Patient's presentation is most consistent with acute presentation with potential threat to life or bodily function. Patient is 62 year old male that presents for bilateral LE edema worsened on the right than the left Denies Non adherence to medication regimen  Workup: CBC, BMP, right lower extremity Doppler ultrasound Findings:  Based on history, exam and findings, presentation most consistent with bilateral lower extremity edema. Low suspicion for PNA, ACS, tamponade, aortic dissection, DVT, arterial occlusion, or cellulitis Patient encouraged to use a short course of mild diuretics (hydrochlorothiazide) as well as using compression stockings prior to follow-up with his PCP and vascular surgery. Disposition (Stable): Discharge    FINAL CLINICAL  IMPRESSION(S) / ED DIAGNOSES   Final diagnoses:  Bilateral lower extremity edema   Rx / DC Orders   ED Discharge Orders          Ordered    hydrochlorothiazide (HYDRODIURIL) 25 MG tablet  Daily        04/10/23 2224           Note:  This document was prepared using Dragon voice recognition software and may include unintentional dictation errors.   Merwyn Katos, MD 04/10/23 [phone]

## 2023-04-10 NOTE — ED Triage Notes (Signed)
Pt states he had a stent put in his right leg last month by [facility]. For the past week he has had swelling in his right leg.

## 2023-04-10 NOTE — ED Provider Notes (Addendum)
  [facility]    CSN: [id number] Arrival date & time: 04/10/23  1734   Alan Trevino is a 62 y.o. male.    [name] presents for worsening right leg pain and swelling for the past week.  States that he had a procedure on his right leg a month ago at [facility] at the vein and vascular Center.  Notes that he had a stent put in.  Vitals are stable.  Patient with visible edema of his right lower extremity.  Recommended patient be evaluated in the emergency department because I am unable to do a ultrasound here.  He may need a vascular surgery evaluation which I do not have access to here either.  He and his wife will travel to [facility] ED.     Katha Cabal, DO      Muddy, Seward Meth, DO 04/10/23 [phone]

## 2023-04-10 NOTE — Discharge Instructions (Addendum)

## 2023-04-10 NOTE — ED Triage Notes (Signed)
Patient to ED via POV for right leg swelling. Swelling started last week- had stent placed last month. 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

## 2023-04-10 NOTE — ED Notes (Signed)
 Patient discharged from ED by provider. Discharge instructions reviewed with patient and all questions answered. Patient ambulatory from ED in NAD.

## 2023-04-10 NOTE — ED Notes (Signed)
Patient is being discharged from the Urgent Care and sent to the Emergency Department via PV . Per Dr Rachael Darby, patient is in need of higher level of care due to Leg swelling. Patient is aware and verbalizes understanding of plan of care.  Vitals:   04/10/23 1804  BP: 132/71  Pulse: 71  Resp: 16  Temp: 98.7 F (37.1 C)  SpO2: 96%

## 2023-05-01 ENCOUNTER — Ambulatory Visit: Payer: BC Managed Care – PPO | Admitting: Family Medicine

## 2023-05-09 ENCOUNTER — Ambulatory Visit: Payer: BC Managed Care – PPO | Admitting: Family Medicine

## 2023-07-01 NOTE — Progress Notes (Deleted)
 MRN : [id number]  Alan Trevino is a 63 y.o. (1960/06/07) male who presents with chief complaint of check circulation.  History of Present Illness:   The patient returns to the office for followup and review status post angiogram with intervention on 02/28/2023.    Procedure:  Percutaneous transluminal angioplasty and stent placement right superficial femoral artery    The patient notes improvement in the lower extremity symptoms. No interval shortening of the patient's claudication distance or rest pain symptoms. No new ulcers or wounds have occurred since the last visit.   There have been no significant changes to the patient's overall health care.   No documented history of amaurosis fugax or recent TIA symptoms. There are no recent neurological changes noted. No documented history of DVT, PE or superficial thrombophlebitis. The patient denies recent episodes of angina or shortness of breath.    ABI's Rt=1.05 and Lt=1.05  (previous ABI's Rt=0.81 and Lt=1.16)  No outpatient medications have been marked as taking for the 07/03/23 encounter (Appointment) with Gilda Crease, Latina Craver, MD.    Past Medical History:  Diagnosis Date   Arthritis    Depression    GERD (gastroesophageal reflux disease)    H/O ulcer disease    Headache    MIGRAINES   Hypertension    SBO (small bowel obstruction) (HCC)    Wears dentures    full upper    Past Surgical History:  Procedure Laterality Date   COLONOSCOPY WITH PROPOFOL N/A 12/29/2015   Procedure: COLONOSCOPY WITH PROPOFOL;  Surgeon: Midge Minium, MD;  Location: [facility];  Service: Endoscopy;  Laterality: N/A;   COLONOSCOPY WITH PROPOFOL N/A 11/29/2019   Procedure: COLONOSCOPY WITH PROPOFOL;  Surgeon: Midge Minium, MD;  Location: [facility];  Service: Endoscopy;  Laterality: N/A;  priority 4   ESOPHAGEAL DILATION     stretching   ESOPHAGEAL DILATION   01/17/2017   Procedure: ESOPHAGEAL DILATION;  Surgeon: Midge Minium, MD;  Location: [facility];  Service: Gastroenterology;;   ESOPHAGOGASTRODUODENOSCOPY (EGD) WITH PROPOFOL N/A 11/29/2016   Procedure: ESOPHAGOGASTRODUODENOSCOPY (EGD) WITH PROPOFOL;  Surgeon: Midge Minium, MD;  Location: [facility] END[facility];  Service: Endoscopy;  Laterality: N/A;   ESOPHAGOGASTRODUODENOSCOPY (EGD) WITH PROPOFOL N/A 01/17/2017   Procedure: ESOPHAGOGASTRODUODENOSCOPY (EGD) WITH PROPOFOL;  Surgeon: Midge Minium, MD;  Location: [facility];  Service: Gastroenterology;  Laterality: N/A;   HERNIA REPAIR Right    INGUINAL HERNIA REPAIR Left 01/14/2020   Procedure: HERNIA REPAIR INGUINAL ADULT;  Surgeon: Earline Mayotte, MD;  Location: [facility];  Service: General;  Laterality: Left;   LOWER EXTREMITY ANGIOGRAPHY Right 02/28/2023   Procedure: Lower Extremity Angiography;  Surgeon: Renford Dills, MD;  Location: [facility];  Service: Cardiovascular;  Laterality: Right;   POLYPECTOMY N/A 12/29/2015   Procedure: POLYPECTOMY;  Surgeon: Midge Minium, MD;  Location: [facility];  Service: Endoscopy;  Laterality: N/A;   POLYPECTOMY  01/17/2017   Procedure: POLYPECTOMY;  Surgeon: Midge Minium, MD;  Location: [facility];  Service: Gastroenterology;;   POLYPECTOMY N/A 11/29/2019   Procedure: POLYPECTOMY;  Surgeon: Midge Minium,  MD;  Location: [facility];  Service: Endoscopy;  Laterality: N/A;   SHOULDER SURGERY Right 09/20/2009   TOTAL HIP ARTHROPLASTY Right 12/21/2020   Procedure: TOTAL HIP ARTHROPLASTY ANTERIOR APPROACH;  Surgeon: Kennedy Bucker, MD;  Location: [facility];  Service: Orthopedics;  Laterality: Right;    Social History Social History   Tobacco Use   Smoking status: Former    Current packs/day: 0.00    Average packs/day: 2.0 packs/day for 30.0 years (60.0 ttl pk-yrs)    Types: Cigarettes    Start date: 59    Quit date: 2000    Years since quitting: 25.2    Smokeless tobacco: Current    Types: Chew  Vaping Use   Vaping status: Never Used  Substance Use Topics   Alcohol use: Yes    Alcohol/week: 12.0 standard drinks of alcohol    Types: 12 Cans of beer per week    Comment: BEER once a week   Drug use: No    Family History Family History  Problem Relation Age of Onset   Ovarian cancer Mother    CAD Father     Allergies  Allergen Reactions   Nsaids     Other Reaction(s): gi bleed     REVIEW OF SYSTEMS (Negative unless checked)  Constitutional: [] Weight loss  [] Fever  [] Chills Cardiac: [] Chest pain   [] Chest pressure   [] Palpitations   [] Shortness of breath when laying flat   [] Shortness of breath with exertion. Vascular:  [x] Pain in legs with walking   [] Pain in legs at rest  [] History of DVT   [] Phlebitis   [] Swelling in legs   [] Varicose veins   [] Non-healing ulcers Pulmonary:   [] Uses home oxygen   [] Productive cough   [] Hemoptysis   [] Wheeze  [] COPD   [] Asthma Neurologic:  [] Dizziness   [] Seizures   [] History of stroke   [] History of TIA  [] Aphasia   [] Vissual changes   [] Weakness or numbness in arm   [x] Weakness or numbness in leg Musculoskeletal:   [] Joint swelling   [x] Joint pain   [x] Low back pain Hematologic:  [] Easy bruising  [] Easy bleeding   [] Hypercoagulable state   [] Anemic Gastrointestinal:  [] Diarrhea   [] Vomiting  [x] Gastroesophageal reflux/heartburn   [] Difficulty swallowing. Genitourinary:  [] Chronic kidney disease   [] Difficult urination  [] Frequent urination   [] Blood in urine Skin:  [] Rashes   [] Ulcers  Psychological:  [] History of anxiety   []  History of major depression.  Physical Examination  There were no vitals filed for this visit. There is no height or weight on file to calculate BMI. Gen: WD/WN, NAD Head: [place]/AT, No temporalis wasting.  Ear/Nose/Throat: Hearing grossly intact, nares w/o erythema or drainage Eyes: PER, EOMI, sclera nonicteric.  Neck: Supple, no masses.  No bruit or JVD.   Pulmonary:  Good air movement, no audible wheezing, no use of accessory muscles.  Cardiac: RRR, normal S1, S2, no Murmurs. Vascular:  mild trophic changes, no open wounds Vessel Right Left  Radial Palpable Palpable  PT Not Palpable Not Palpable  DP Not Palpable Not Palpable  Gastrointestinal: soft, non-distended. No guarding/no peritoneal signs.  Musculoskeletal: M/S 5/5 throughout.  No visible deformity.  Neurologic: CN 2-12 intact. Pain and light touch intact in extremities.  Symmetrical.  Speech is fluent. Motor exam as listed above. Psychiatric: Judgment intact, Mood & affect appropriate for pt's clinical situation. Dermatologic: No rashes or ulcers noted.  No changes consistent with cellulitis.   CBC Lab Results  Component Value Date  WBC 9.9 04/10/2023   HGB 13.8 04/10/2023   HCT 42.3 04/10/2023   MCV 97.2 04/10/2023   PLT 284 04/10/2023    BMET    Component Value Date/Time   NA 142 04/10/2023 1848   NA 141 11/15/2022 1436   NA 139 04/16/2013 1607   K 3.4 (L) 04/10/2023 1848   K 4.0 04/16/2013 1607   CL 105 04/10/2023 1848   CL 97 (L) 04/16/2013 1607   CO2 26 04/10/2023 1848   CO2 31 04/16/2013 1607   GLUCOSE 98 04/10/2023 1848   GLUCOSE 106 (H) 04/16/2013 1607   BUN 9 04/10/2023 1848   BUN 13 11/15/2022 1436   BUN 10 04/16/2013 1607   CREATININE 1.17 04/10/2023 1848   CREATININE 1.12 04/16/2013 1607   CALCIUM 9.0 04/10/2023 1848   CALCIUM 9.6 04/16/2013 1607   GFRNONAA >60 04/10/2023 1848   GFRNONAA >60 04/16/2013 1607   GFRAA 69 03/09/2020 1423   GFRAA >60 04/16/2013 1607   CrCl cannot be calculated (Patient's most recent lab result is older than the maximum 21 days allowed.).  COAG Lab Results  Component Value Date   INR 1.1 02/28/2023    Radiology No results found.   Assessment/Plan There are no diagnoses linked to this encounter.   Levora Dredge, MD  07/01/2023 9:40 AM

## 2023-07-03 ENCOUNTER — Ambulatory Visit (INDEPENDENT_AMBULATORY_CARE_PROVIDER_SITE_OTHER): Payer: BC Managed Care – PPO | Admitting: Vascular Surgery

## 2023-07-03 ENCOUNTER — Ambulatory Visit (INDEPENDENT_AMBULATORY_CARE_PROVIDER_SITE_OTHER): Payer: BC Managed Care – PPO

## 2023-07-03 ENCOUNTER — Ambulatory Visit: Admitting: Family Medicine

## 2023-07-03 DIAGNOSIS — I7025 Atherosclerosis of native arteries of other extremities with ulceration: Secondary | ICD-10-CM

## 2023-07-03 DIAGNOSIS — E781 Pure hyperglyceridemia: Secondary | ICD-10-CM

## 2023-07-03 DIAGNOSIS — I10 Essential (primary) hypertension: Secondary | ICD-10-CM

## 2023-07-03 DIAGNOSIS — M51379 Other intervertebral disc degeneration, lumbosacral region without mention of lumbar back pain or lower extremity pain: Secondary | ICD-10-CM

## 2023-07-03 LAB — VAS US ABI WITH/WO TBI
Left ABI: 1.11
Right ABI: 1.11

## 2023-07-07 ENCOUNTER — Encounter: Payer: Self-pay | Admitting: Family Medicine

## 2023-07-07 ENCOUNTER — Ambulatory Visit (INDEPENDENT_AMBULATORY_CARE_PROVIDER_SITE_OTHER): Admitting: Family Medicine

## 2023-07-07 VITALS — BP 118/62 | HR 62 | Ht 63.0 in | Wt 186.0 lb

## 2023-07-07 DIAGNOSIS — E876 Hypokalemia: Secondary | ICD-10-CM | POA: Diagnosis not present

## 2023-07-07 DIAGNOSIS — M519 Unspecified thoracic, thoracolumbar and lumbosacral intervertebral disc disorder: Secondary | ICD-10-CM | POA: Diagnosis not present

## 2023-07-07 DIAGNOSIS — I10 Essential (primary) hypertension: Secondary | ICD-10-CM

## 2023-07-07 MED ORDER — CYCLOBENZAPRINE HCL 5 MG PO TABS: 5.0000 mg | ORAL_TABLET | Freq: Every day | ORAL | 1 refills | Status: DC

## 2023-07-07 MED ORDER — AMLODIPINE BESY-BENAZEPRIL HCL 10-20 MG PO CAPS: 1.0000 | ORAL_CAPSULE | Freq: Every day | ORAL | 1 refills | Status: DC

## 2023-07-07 NOTE — Progress Notes (Signed)
 Date:  07/07/2023   Name:  Alan Trevino   DOB:  [birth year]   MRN:  [id number]   Chief Complaint: No chief complaint on file.  Hypertension This is a chronic problem. The current episode started more than 1 year ago. The problem has been gradually improving since onset. The problem is controlled. Pertinent negatives include no anxiety, blurred vision, chest pain, headaches, malaise/fatigue, neck pain, orthopnea, palpitations, peripheral edema, PND, shortness of breath or sweats. There are no associated agents to hypertension. Risk factors for coronary artery disease include dyslipidemia. The current treatment provides moderate improvement. There are no compliance problems.  There is no history of CAD/MI or CVA. There is no history of chronic renal disease, a hypertension causing med or renovascular disease.  Back Pain This is a chronic problem. The current episode started more than 1 year ago. The problem has been waxing and waning since onset. The pain is present in the lumbar spine. The quality of the pain is described as aching. The pain is at a severity of 5/10. The symptoms are aggravated by standing. Pertinent negatives include no abdominal pain, bladder incontinence, bowel incontinence, chest pain or headaches.    Lab Results  Component Value Date   NA 142 04/10/2023   K 3.4 (L) 04/10/2023   CO2 26 04/10/2023   GLUCOSE 98 04/10/2023   BUN 9 04/10/2023   CREATININE 1.17 04/10/2023   CALCIUM 9.0 04/10/2023   EGFR 68 11/15/2022   GFRNONAA >60 04/10/2023   Lab Results  Component Value Date   CHOL 163 11/15/2022   HDL 51 11/15/2022   LDLCALC 92 11/15/2022   TRIG 108 11/15/2022   CHOLHDL 5.6 (H) 02/01/2019   No results found for: "TSH" No results found for: "HGBA1C" Lab Results  Component Value Date   WBC 9.9 04/10/2023   HGB 13.8 04/10/2023   HCT 42.3 04/10/2023   MCV 97.2 04/10/2023   PLT 284 04/10/2023   Lab Results  Component Value Date   ALT 27 03/08/2023    AST 26 03/08/2023   ALKPHOS 65 03/08/2023   BILITOT 0.5 03/08/2023   No results found for: "25OHVITD2", "25OHVITD3", "VD25OH"   Review of Systems  Constitutional:  Negative for malaise/fatigue.  Eyes:  Negative for blurred vision and visual disturbance.  Respiratory:  Negative for cough, chest tightness, shortness of breath and wheezing.   Cardiovascular:  Negative for chest pain, palpitations, orthopnea and PND.  Gastrointestinal:  Negative for abdominal pain, blood in stool and bowel incontinence.  Endocrine: Negative for polydipsia and polyuria.  Genitourinary:  Negative for bladder incontinence, difficulty urinating, frequency, hematuria and urgency.  Musculoskeletal:  Positive for back pain. Negative for arthralgias and neck pain.  Neurological:  Negative for headaches.    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   History of colonic polyps    Acute peptic ulcer without hemorrhage and perforation    Hiatal hernia    Esophagogastric ulcer    Stricture and stenosis of esophagus    GIB (gastrointestinal bleeding) 11/30/2016   Hematemesis without nausea    Acute esophagogastric ulcer    Upper GI bleed 11/28/2016   Acute recurrent maxillary sinusitis 09/07/2016   Special screening  for malignant neoplasms, colon    Benign neoplasm of ascending colon    Benign neoplasm of transverse colon    Benign neoplasm of descending colon    Rectal polyp    Lumbar disc disease 11/03/2015   Iron deficiency anemia 08/25/2014   Acute gastrointestinal bleeding 08/25/2014   Acute back pain with sciatica 08/25/2014   Routine general medical examination at a health care facility 08/25/2014   DDD  (degenerative disc disease), lumbosacral 08/25/2014   Recurrent major depressive episodes (HCC) 08/25/2014   Essential (primary) hypertension 08/25/2014   HLD (hyperlipidemia) 08/25/2014   Verruca vulgaris 11/12/2008   Myoclonus 08/23/2008   Restless leg syndrome 07/27/2008    Allergies  Allergen Reactions   Simvastatin     Other Reaction(s): myalgia arthralgia   Nsaids     Other Reaction(s): gi bleed    Past Surgical History:  Procedure Laterality Date   COLONOSCOPY WITH PROPOFOL N/A 12/29/2015   Procedure: COLONOSCOPY WITH PROPOFOL;  Surgeon: Midge Minium, MD;  Location: [facility];  Service: Endoscopy;  Laterality: N/A;   COLONOSCOPY WITH PROPOFOL N/A 11/29/2019   Procedure: COLONOSCOPY WITH PROPOFOL;  Surgeon: Midge Minium, MD;  Location: [facility];  Service: Endoscopy;  Laterality: N/A;  priority 4   ESOPHAGEAL DILATION     stretching   ESOPHAGEAL DILATION  01/17/2017   Procedure: ESOPHAGEAL DILATION;  Surgeon: Midge Minium, MD;  Location: [facility];  Service: Gastroenterology;;   ESOPHAGOGASTRODUODENOSCOPY (EGD) WITH PROPOFOL N/A 11/29/2016   Procedure: ESOPHAGOGASTRODUODENOSCOPY (EGD) WITH PROPOFOL;  Surgeon: Midge Minium, MD;  Location: [facility];  Service: Endoscopy;  Laterality: N/A;   ESOPHAGOGASTRODUODENOSCOPY (EGD) WITH PROPOFOL N/A 01/17/2017   Procedure: ESOPHAGOGASTRODUODENOSCOPY (EGD) WITH PROPOFOL;  Surgeon: Midge Minium, MD;  Location: [facility];  Service: Gastroenterology;  Laterality: N/A;   HERNIA REPAIR Right    INGUINAL HERNIA REPAIR Left 01/14/2020   Procedure: HERNIA REPAIR INGUINAL ADULT;  Surgeon: Earline Mayotte, MD;  Location: [facility];  Service: General;  Laterality: Left;   LOWER EXTREMITY ANGIOGRAPHY Right 02/28/2023   Procedure: Lower Extremity Angiography;  Surgeon: Renford Dills, MD;  Location: [facility];  Service: Cardiovascular;  Laterality: Right;   POLYPECTOMY N/A 12/29/2015    Procedure: POLYPECTOMY;  Surgeon: Midge Minium, MD;  Location: [facility];  Service: Endoscopy;  Laterality: N/A;   POLYPECTOMY  01/17/2017   Procedure: POLYPECTOMY;  Surgeon: Midge Minium, MD;  Location: [facility];  Service: Gastroenterology;;   POLYPECTOMY N/A 11/29/2019   Procedure: POLYPECTOMY;  Surgeon: Midge Minium, MD;  Location: [facility];  Service: Endoscopy;  Laterality: N/A;   SHOULDER SURGERY Right 09/20/2009   TOTAL HIP ARTHROPLASTY Right 12/21/2020   Procedure: TOTAL HIP ARTHROPLASTY ANTERIOR APPROACH;  Surgeon: Kennedy Bucker, MD;  Location: [facility];  Service: Orthopedics;  Laterality: Right;    Social History   Tobacco Use   Smoking status: Former    Current packs/day: 0.00    Average packs/day: 2.0 packs/day for 30.0 years (60.0 ttl pk-yrs)    Types: Cigarettes    Start date: 17    Quit date: 2000    Years since quitting: 25.2   Smokeless tobacco: Current    Types: Chew  Vaping Use   Vaping status: Never Used  Substance Use Topics   Alcohol use: Yes    Alcohol/week: 12.0 standard drinks of alcohol    Types: 12 Cans of beer per week    Comment: BEER once a week   Drug  use: No     Medication list has been reviewed and updated.  Current Meds  Medication Sig   amLODipine-benazepril (LOTREL) 10-20 MG capsule Take 1 capsule by mouth daily.   aspirin 81 MG chewable tablet Chew 1 tablet (81 mg total) by mouth daily.   atorvastatin (LIPITOR) 40 MG tablet Take 1 tablet (40 mg total) by mouth daily. TAKE 1 TABLET BY MOUTH EVERY DAY   bisacodyl (DULCOLAX) 5 MG EC tablet Take 2 tablets (10 mg total) by mouth at bedtime.   buPROPion (WELLBUTRIN XL) 300 MG 24 hr tablet Take 300 mg by mouth daily.    busPIRone (BUSPAR) 30 MG tablet Take 30 mg by mouth 2 (two) times daily.    clopidogrel (PLAVIX) 75 MG tablet Take 1 tablet (75 mg total) by mouth daily with breakfast.   cyclobenzaprine (FLEXERIL) 5 MG tablet Take 1 tablet (5 mg total) by mouth at  bedtime.   methylphenidate (RITALIN) 20 MG tablet Take 10-20 mg by mouth 3 (three) times daily with meals.   oxyCODONE-acetaminophen (PERCOCET) 5-325 MG tablet Take 1 tablet by mouth every 6 (six) hours as needed for severe pain (pain score 7-10).   pantoprazole (PROTONIX) 40 MG tablet Take 1 tablet (40 mg total) by mouth 2 (two) times daily. Decrease to daily dosing after 30 days   polyethylene glycol (MIRALAX / GLYCOLAX) 17 g packet Take 17 g by mouth 2 (two) times daily.       07/07/2023    8:27 AM 03/07/2023    1:25 PM 11/15/2022    1:50 PM 05/17/2022    3:08 PM  GAD 7 : Generalized Anxiety Score  Nervous, Anxious, on Edge 0 0 0 0  Control/stop worrying 0 0 0 0  Worry too much - different things 0 0 0 0  Trouble relaxing 0 0 0 0  Restless 0 0 0 0  Easily annoyed or irritable 0 0 0 0  Afraid - awful might happen 0 0 0 0  Total GAD 7 Score 0 0 0 0  Anxiety Difficulty Not difficult at all Not difficult at all Not difficult at all Not difficult at all       07/07/2023    8:27 AM 03/07/2023    1:25 PM 11/15/2022    1:50 PM  Depression screen PHQ 2/9  Decreased Interest 0 0 0  Down, Depressed, Hopeless 0 0 0  PHQ - 2 Score 0 0 0  Altered sleeping 0 0 0  Tired, decreased energy 0 1 0  Change in appetite 0 0 0  Feeling bad or failure about yourself  0 0 0  Trouble concentrating 0 0 0  Moving slowly or fidgety/restless 0 0 0  Suicidal thoughts 0 0 0  PHQ-9 Score 0 1 0  Difficult doing work/chores Not difficult at all Not difficult at all Not difficult at all    BP Readings from Last 3 Encounters:  07/07/23 118/62  04/10/23 137/75  04/10/23 132/71    Physical Exam Vitals and nursing note reviewed.  Constitutional:      Appearance: He is well-developed.  HENT:     Head: Normocephalic and atraumatic.     Right Ear: Tympanic membrane, ear canal and external ear normal.     Left Ear: Tympanic membrane, ear canal and external ear normal.     Nose: Nose normal.      Mouth/Throat:     Dentition: Normal dentition.  Eyes:     General: Lids are normal.  No scleral icterus.    Conjunctiva/sclera: Conjunctivae normal.     Pupils: Pupils are equal, round, and reactive to light.  Neck:     Thyroid: No thyromegaly.     Vascular: No carotid bruit, hepatojugular reflux or JVD.     Trachea: No tracheal deviation.  Cardiovascular:     Rate and Rhythm: Normal rate and regular rhythm.     Heart sounds: Normal heart sounds.  Pulmonary:     Effort: Pulmonary effort is normal.     Breath sounds: Normal breath sounds. No wheezing, rhonchi or rales.  Abdominal:     General: Bowel sounds are normal.     Palpations: Abdomen is soft. There is no hepatomegaly, splenomegaly or mass.     Tenderness: There is no abdominal tenderness.     Hernia: There is no hernia in the left inguinal area.  Musculoskeletal:        General: Normal range of motion.     Cervical back: Normal range of motion and neck supple.     Lumbar back: Spasms present.  Lymphadenopathy:     Cervical: No cervical adenopathy.  Skin:    General: Skin is warm and dry.     Findings: No rash.  Neurological:     Mental Status: He is alert and oriented to person, place, and time.     Sensory: No sensory deficit.     Deep Tendon Reflexes: Reflexes are normal and symmetric.  Psychiatric:        Mood and Affect: Mood is not anxious or depressed.     Wt Readings from Last 3 Encounters:  07/07/23 186 lb (84.4 kg)  04/10/23 180 lb (81.6 kg)  03/27/23 182 lb 15.7 oz (83 kg)    BP 118/62   Pulse 62   Ht 5\' 3"  (1.6 m)   Wt 186 lb (84.4 kg)   SpO2 95%   BMI 32.95 kg/m   Assessment and Plan: 1. Essential hypertension (Primary) Chronic.  Controlled.  Stable.  Asymptomatic.  Blood pressure 118/62.  Asymptomatic.  Tolerating medication well.  Will continue amlodipine benazepril 10-20 mg once a day.  Will check renal function panel for GFR and electrolytes. - amLODipine-benazepril (LOTREL) 10-20 MG  capsule; Take 1 capsule by mouth daily.  Dispense: 90 capsule; Refill: 1 - Renal Function Panel  2. Lumbar disc disease Chronic.  Episodic.  Patient has chronic history of lower back pain that is responding to current therapy including cyclobenzaprine 5 mg nightly.  Will continue with this and will recheck in 6 months. - cyclobenzaprine (FLEXERIL) 5 MG tablet; Take 1 tablet (5 mg total) by mouth at bedtime.  Dispense: 90 tablet; Refill: 1  3. Hypokalemia Previous electrolytes noted decrease in potassium.  Patient is currently not taking hydrochlorothiazide or any other diuretic.  We will check renal function panel for current level electrolytes and GFR as well.  Will recheck patient in 6 months. - Renal Function Panel     Elizabeth Sauer, MD

## 2023-07-08 ENCOUNTER — Encounter: Payer: Self-pay | Admitting: Family Medicine

## 2023-07-08 LAB — RENAL FUNCTION PANEL
Albumin: 4.3 g/dL (ref 3.9–4.9)
BUN/Creatinine Ratio: 9 — ABNORMAL LOW (ref 10–24)
BUN: 11 mg/dL (ref 8–27)
CO2: 23 mmol/L (ref 20–29)
Calcium: 9.5 mg/dL (ref 8.6–10.2)
Chloride: 105 mmol/L (ref 96–106)
Creatinine, Ser: 1.23 mg/dL (ref 0.76–1.27)
Glucose: 150 mg/dL — ABNORMAL HIGH (ref 70–99)
Phosphorus: 2.7 mg/dL — ABNORMAL LOW (ref 2.8–4.1)
Potassium: 4.3 mmol/L (ref 3.5–5.2)
Sodium: 142 mmol/L (ref 134–144)
eGFR: 66 mL/min/{1.73_m2} (ref >59)

## 2023-07-20 DIAGNOSIS — I70219 Atherosclerosis of native arteries of extremities with intermittent claudication, unspecified extremity: Secondary | ICD-10-CM | POA: Insufficient documentation

## 2023-07-20 NOTE — Progress Notes (Unsigned)
 MRN : [id number]  Alan Trevino is a 63 y.o. (April 09, 1961) male who presents with chief complaint of check circulation.  History of Present Illness:   The patient returns to the office for followup and review status post angiogram with intervention on 02/28/2023.    Procedure:  Percutaneous transluminal angioplasty and stent placement right superficial femoral artery    The patient notes improvement in the lower extremity symptoms. No interval shortening of the patient's claudication distance or rest pain symptoms. No new ulcers or wounds have occurred since the last visit.   There have been no significant changes to the patient's overall health care.   No documented history of amaurosis fugax or recent TIA symptoms. There are no recent neurological changes noted. No documented history of DVT, PE or superficial thrombophlebitis. The patient denies recent episodes of angina or shortness of breath.    ABI's Rt=1.11(Triphasic) and Lt=1.11(Triphasic)  (previous ABI's Rt=1.05 and Lt=1.05 ).  Duplex ultrasound of the right lower extremity dated 07/03/2023 demonstrates uniform velocities throughout the femoral-popliteal system.  The stent is widely patent.  Distal runoff is preserved  No outpatient medications have been marked as taking for the 07/21/23 encounter (Appointment) with Gilda Crease, Latina Craver, MD.    Past Medical History:  Diagnosis Date   Arthritis    Depression    GERD (gastroesophageal reflux disease)    H/O ulcer disease    Headache    MIGRAINES   Hypertension    SBO (small bowel obstruction) (HCC)    Wears dentures    full upper    Past Surgical History:  Procedure Laterality Date   COLONOSCOPY WITH PROPOFOL N/A 12/29/2015   Procedure: COLONOSCOPY WITH PROPOFOL;  Surgeon: Midge Minium, MD;  Location: [facility];  Service: Endoscopy;  Laterality: N/A;   COLONOSCOPY WITH PROPOFOL N/A  11/29/2019   Procedure: COLONOSCOPY WITH PROPOFOL;  Surgeon: Midge Minium, MD;  Location: [facility];  Service: Endoscopy;  Laterality: N/A;  priority 4   ESOPHAGEAL DILATION     stretching   ESOPHAGEAL DILATION  01/17/2017   Procedure: ESOPHAGEAL DILATION;  Surgeon: Midge Minium, MD;  Location: [facility];  Service: Gastroenterology;;   ESOPHAGOGASTRODUODENOSCOPY (EGD) WITH PROPOFOL N/A 11/29/2016   Procedure: ESOPHAGOGASTRODUODENOSCOPY (EGD) WITH PROPOFOL;  Surgeon: Midge Minium, MD;  Location: [facility] ENDOSCOPY;  Service: Endoscopy;  Laterality: N/A;   ESOPHAGOGASTRODUODENOSCOPY (EGD) WITH PROPOFOL N/A 01/17/2017   Procedure: ESOPHAGOGASTRODUODENOSCOPY (EGD) WITH PROPOFOL;  Surgeon: Midge Minium, MD;  Location: [facility];  Service: Gastroenterology;  Laterality: N/A;   HERNIA REPAIR Right    INGUINAL HERNIA REPAIR Left 01/14/2020   Procedure: HERNIA REPAIR INGUINAL ADULT;  Surgeon: Earline Mayotte, MD;  Location: [facility] ORS;  Service: General;  Laterality: Left;   LOWER EXTREMITY ANGIOGRAPHY Right 02/28/2023   Procedure: Lower Extremity Angiography;  Surgeon: Renford Dills, MD;  Location: [facility] INVASIVE CV LAB;  Service: Cardiovascular;  Laterality: Right;   POLYPECTOMY N/A 12/29/2015   Procedure: POLYPECTOMY;  Surgeon: Midge Minium, MD;  Location: [facility];  Service: Endoscopy;  Laterality: N/A;   POLYPECTOMY  01/17/2017  Procedure: POLYPECTOMY;  Surgeon: Midge Minium, MD;  Location: [facility];  Service: Gastroenterology;;   POLYPECTOMY N/A 11/29/2019   Procedure: POLYPECTOMY;  Surgeon: Midge Minium, MD;  Location: [facility];  Service: Endoscopy;  Laterality: N/A;   SHOULDER SURGERY Right 09/20/2009   TOTAL HIP ARTHROPLASTY Right 12/21/2020   Procedure: TOTAL HIP ARTHROPLASTY ANTERIOR APPROACH;  Surgeon: Kennedy Bucker, MD;  Location: [facility] ORS;  Service: Orthopedics;  Laterality: Right;    Social History Social History    Tobacco Use   Smoking status: Former    Current packs/day: 0.00    Average packs/day: 2.0 packs/day for 30.0 years (60.0 ttl pk-yrs)    Types: Cigarettes    Start date: 49    Quit date: 2000    Years since quitting: 25.2   Smokeless tobacco: Current    Types: Chew  Vaping Use   Vaping status: Never Used  Substance Use Topics   Alcohol use: Yes    Alcohol/week: 12.0 standard drinks of alcohol    Types: 12 Cans of beer per week    Comment: BEER once a week   Drug use: No    Family History Family History  Problem Relation Age of Onset   Ovarian cancer Mother    CAD Father     Allergies  Allergen Reactions   Simvastatin     Other Reaction(s): myalgia arthralgia   Nsaids     Other Reaction(s): gi bleed     REVIEW OF SYSTEMS (Negative unless checked)  Constitutional: [] Weight loss  [] Fever  [] Chills Cardiac: [] Chest pain   [] Chest pressure   [] Palpitations   [] Shortness of breath when laying flat   [] Shortness of breath with exertion. Vascular:  [x] Pain in legs with walking   [] Pain in legs at rest  [] History of DVT   [] Phlebitis   [] Swelling in legs   [] Varicose veins   [] Non-healing ulcers Pulmonary:   [] Uses home oxygen   [] Productive cough   [] Hemoptysis   [] Wheeze  [] COPD   [] Asthma Neurologic:  [] Dizziness   [] Seizures   [] History of stroke   [] History of TIA  [] Aphasia   [] Vissual changes   [] Weakness or numbness in arm   [] Weakness or numbness in leg Musculoskeletal:   [] Joint swelling   [] Joint pain   [] Low back pain Hematologic:  [] Easy bruising  [] Easy bleeding   [] Hypercoagulable state   [] Anemic Gastrointestinal:  [] Diarrhea   [] Vomiting  [] Gastroesophageal reflux/heartburn   [] Difficulty swallowing. Genitourinary:  [] Chronic kidney disease   [] Difficult urination  [] Frequent urination   [] Blood in urine Skin:  [] Rashes   [] Ulcers  Psychological:  [] History of anxiety   []  History of major depression.  Physical Examination  There were no vitals filed  for this visit. There is no height or weight on file to calculate BMI. Gen: WD/WN, NAD Head: [place]/AT, No temporalis wasting.  Ear/Nose/Throat: Hearing grossly intact, nares w/o erythema or drainage Eyes: PER, EOMI, sclera nonicteric.  Neck: Supple, no masses.  No bruit or JVD.  Pulmonary:  Good air movement, no audible wheezing, no use of accessory muscles.  Cardiac: RRR, normal S1, S2, no Murmurs. Vascular:  mild trophic changes, no open wounds Vessel Right Left  Radial Palpable Palpable  PT Palpable Palpable  DP  Palpable  Palpable  Gastrointestinal: soft, non-distended. No guarding/no peritoneal signs.  Musculoskeletal: M/S 5/5 throughout.  No visible deformity.  Neurologic: CN 2-12 intact. Pain and light touch intact in extremities.  Symmetrical.  Speech is fluent. Motor  exam as listed above. Psychiatric: Judgment intact, Mood & affect appropriate for pt's clinical situation. Dermatologic: No rashes or ulcers noted.  No changes consistent with cellulitis.   CBC Lab Results  Component Value Date   WBC 9.9 04/10/2023   HGB 13.8 04/10/2023   HCT 42.3 04/10/2023   MCV 97.2 04/10/2023   PLT 284 04/10/2023    BMET    Component Value Date/Time   NA 142 07/07/2023 0902   NA 139 04/16/2013 1607   K 4.3 07/07/2023 0902   K 4.0 04/16/2013 1607   CL 105 07/07/2023 0902   CL 97 (L) 04/16/2013 1607   CO2 23 07/07/2023 0902   CO2 31 04/16/2013 1607   GLUCOSE 150 (H) 07/07/2023 0902   GLUCOSE 98 04/10/2023 1848   GLUCOSE 106 (H) 04/16/2013 1607   BUN 11 07/07/2023 0902   BUN 10 04/16/2013 1607   CREATININE 1.23 07/07/2023 0902   CREATININE 1.12 04/16/2013 1607   CALCIUM 9.5 07/07/2023 0902   CALCIUM 9.6 04/16/2013 1607   GFRNONAA >60 04/10/2023 1848   GFRNONAA >60 04/16/2013 1607   GFRAA 69 03/09/2020 1423   GFRAA >60 04/16/2013 1607   Estimated Creatinine Clearance: 59 mL/min (by C-G formula based on SCr of 1.23 mg/dL).  COAG Lab Results  Component Value Date   INR 1.1  02/28/2023    Radiology VAS [place] LOWER EXTREMITY ARTERIAL DUPLEX Result Date: 07/03/2023 LOWER EXTREMITY ARTERIAL DUPLEX STUDY Patient Name:  MARSH HECKLER  Date of Exam:   07/03/2023 Medical Rec #: [id number]        Accession #:    [id number] Date of Birth: November 08, 1960        Patient Gender: M Patient Age:   31 years Exam Location:  [facility] Procedure:      VAS [place] LOWER EXTREMITY ARTERIAL DUPLEX Referring Phys: Earl Lites Edword Cu --------------------------------------------------------------------------------   Current ABI: rt = 1.10 Performing Technologist: Salvadore Farber RVT  Examination Guidelines: A complete evaluation includes B-mode imaging, spectral Doppler, color Doppler, and power Doppler as needed of all accessible portions of each vessel. Bilateral testing is considered an integral part of a complete examination. Limited examinations for reoccurring indications may be performed as noted.  +----------+--------+-----+--------+---------+--------+ RIGHT     PSV cm/sRatioStenosisWaveform Comments +----------+--------+-----+--------+---------+--------+ CFA Mid   103                  biphasic          +----------+--------+-----+--------+---------+--------+ DFA       66                   biphasic          +----------+--------+-----+--------+---------+--------+ SFA Prox  116                  triphasicstent    +----------+--------+-----+--------+---------+--------+ SFA Mid   103                  triphasicstent    +----------+--------+-----+--------+---------+--------+ SFA Distal81                   triphasicstent    +----------+--------+-----+--------+---------+--------+ POP Distal111                  triphasic         +----------+--------+-----+--------+---------+--------+ ATA Distal71                   triphasic         +----------+--------+-----+--------+---------+--------+ PTA Distal57  biphasic           +----------+--------+-----+--------+---------+--------+  Summary: Right: Right LEA native system and stents are patent  See table(s) above for measurements and observations. Electronically signed by Levora Dredge MD on 07/03/2023 at 3:59:27 PM.    Final    VAS [place] ABI WITH/WO TBI Result Date: 07/03/2023  LOWER EXTREMITY DOPPLER STUDY Patient Name:  TOMMEY BARRET  Date of Exam:   07/03/2023 Medical Rec #: [id number]        Accession #:    [id number] Date of Birth: [birth year]        Patient Gender: M Patient Age:   33 years Exam Location:  [facility] Procedure:      VAS [place] ABI WITH/WO TBI Referring Phys: --------------------------------------------------------------------------------  Indications: Claudication, and peripheral artery disease. High Risk Factors: Hypertension, hyperlipidemia.  Vascular Interventions: 02/28/2023 Right SFA stent. Comparison Study: 02/2023 Performing Technologist: Salvadore Farber RVT  Examination Guidelines: A complete evaluation includes at minimum, Doppler waveform signals and systolic blood pressure reading at the level of bilateral brachial, anterior tibial, and posterior tibial arteries, when vessel segments are accessible. Bilateral testing is considered an integral part of a complete examination. Photoelectric Plethysmograph (PPG) waveforms and toe systolic pressure readings are included as required and additional duplex testing as needed. Limited examinations for reoccurring indications may be performed as noted.  ABI Findings: +---------+------------------+-----+---------+--------+ Right    Rt Pressure (mmHg)IndexWaveform Comment  +---------+------------------+-----+---------+--------+ Brachial 120                                      +---------+------------------+-----+---------+--------+ PTA      137               1.11 triphasic         +---------+------------------+-----+---------+--------+ DP       135               1.10 triphasic          +---------+------------------+-----+---------+--------+ [place] Amy               0.92 Normal            +---------+------------------+-----+---------+--------+ +---------+------------------+-----+---------+-------+ Left     Lt Pressure (mmHg)IndexWaveform Comment +---------+------------------+-----+---------+-------+ Brachial 123                                     +---------+------------------+-----+---------+-------+ PTA      136               1.11 triphasic        +---------+------------------+-----+---------+-------+ DP       129               1.05 triphasic        +---------+------------------+-----+---------+-------+ Pearson Grippe               1.09 Normal           +---------+------------------+-----+---------+-------+ +-------+-----------+-----------+------------+------------+ ABI/TBIToday's ABIToday's TBIPrevious ABIPrevious TBI +-------+-----------+-----------+------------+------------+ Right  1.11       .92        1.05        .60          +-------+-----------+-----------+------------+------------+ Left   1.11       1.09       1.05        .71          +-------+-----------+-----------+------------+------------+  Right TBIs appear increased compared to prior study on 02/2023.  Summary: Right: Resting right ankle-brachial index is within normal range. The right toe-brachial index is normal. Left: Resting left ankle-brachial index is within normal range. The left toe-brachial index is normal. *See table(s) above for measurements and observations.  Electronically signed by Levora Dredge MD on 07/03/2023 at 3:59:07 PM.    Final      Assessment/Plan 1. Atherosclerosis of native artery of both lower extremities with intermittent claudication (HCC) (Primary) Recommend:  The patient is status post successful angiogram with intervention.  The patient reports that the claudication symptoms and leg pain has improved.   The patient denies lifestyle limiting  changes at this point in time.  No further invasive studies, angiography or surgery at this time. The patient should continue walking and begin a more formal exercise program.  The patient should continue antiplatelet therapy and aggressive treatment of the lipid abnormalities  Continued surveillance is indicated as atherosclerosis is likely to progress with time.    Patient should undergo noninvasive studies as ordered. The patient will follow up with me to review the studies.  - VAS [place] LOWER EXTREMITY ARTERIAL DUPLEX; Future - VAS [place] ABI WITH/WO TBI; Future  2. Essential (primary) hypertension Continue antihypertensive medications as already ordered, these medications have been reviewed and there are no changes at this time.  3. Degeneration of intervertebral disc of lumbosacral region, unspecified whether pain present Continue medications to treat the patient's degenerative disease as already ordered, these medications have been reviewed and there are no changes at this time.  Continued activity and therapy was stressed.  4. Pure hypertriglyceridemia Continue statin as ordered and reviewed, no changes at this time    Levora Dredge, MD  07/20/2023 1:59 PM

## 2023-07-21 ENCOUNTER — Ambulatory Visit (INDEPENDENT_AMBULATORY_CARE_PROVIDER_SITE_OTHER): Admitting: Vascular Surgery

## 2023-07-21 ENCOUNTER — Encounter (INDEPENDENT_AMBULATORY_CARE_PROVIDER_SITE_OTHER): Payer: Self-pay | Admitting: Vascular Surgery

## 2023-07-21 VITALS — BP 159/85 | HR 66 | Resp 16 | Wt 188.4 lb

## 2023-07-21 DIAGNOSIS — E781 Pure hyperglyceridemia: Secondary | ICD-10-CM

## 2023-07-21 DIAGNOSIS — I70213 Atherosclerosis of native arteries of extremities with intermittent claudication, bilateral legs: Secondary | ICD-10-CM

## 2023-07-21 DIAGNOSIS — I10 Essential (primary) hypertension: Secondary | ICD-10-CM

## 2023-07-21 DIAGNOSIS — M51379 Other intervertebral disc degeneration, lumbosacral region without mention of lumbar back pain or lower extremity pain: Secondary | ICD-10-CM

## 2023-09-09 ENCOUNTER — Encounter (INDEPENDENT_AMBULATORY_CARE_PROVIDER_SITE_OTHER): Payer: Self-pay

## 2023-12-19 ENCOUNTER — Ambulatory Visit (INDEPENDENT_AMBULATORY_CARE_PROVIDER_SITE_OTHER): Admitting: Physician Assistant

## 2023-12-19 VITALS — BP 120/78 | HR 76 | Temp 98.2°F | Ht 63.0 in | Wt 183.0 lb

## 2023-12-19 DIAGNOSIS — R739 Hyperglycemia, unspecified: Secondary | ICD-10-CM

## 2023-12-19 DIAGNOSIS — I10 Essential (primary) hypertension: Secondary | ICD-10-CM

## 2023-12-19 DIAGNOSIS — E782 Mixed hyperlipidemia: Secondary | ICD-10-CM

## 2023-12-19 DIAGNOSIS — D509 Iron deficiency anemia, unspecified: Secondary | ICD-10-CM

## 2023-12-19 DIAGNOSIS — Z125 Encounter for screening for malignant neoplasm of prostate: Secondary | ICD-10-CM

## 2023-12-19 MED ORDER — AMLODIPINE BESY-BENAZEPRIL HCL 10-20 MG PO CAPS: 1.0000 | ORAL_CAPSULE | Freq: Every day | ORAL | 1 refills | Status: DC

## 2023-12-19 NOTE — Assessment & Plan Note (Signed)
 Well-controlled with current therapy, continue.  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

## 2023-12-19 NOTE — Patient Instructions (Signed)

## 2023-12-19 NOTE — Progress Notes (Signed)
 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 Cathryne Molt.  Desires refill on his blood pressure medicine.   He is also followed by vascular surgery due to known peripheral artery disease requiring stent to the right femoral artery last year and ongoing DAPT.  He has a follow-up with them 01-22-24  He is due for routine labs today.   Medication list has been reviewed and updated.  Current Meds  Medication Sig   aspirin  81 MG chewable tablet Chew 1 tablet (81 mg total) by mouth daily.   atorvastatin  (LIPITOR) 40 MG tablet Take 1 tablet (40 mg total) by mouth daily. TAKE 1 TABLET BY MOUTH EVERY DAY   buPROPion  (WELLBUTRIN  XL) 300 MG 24 hr tablet Take 300 mg by mouth daily.    busPIRone  (BUSPAR ) 30 MG tablet Take 30 mg by mouth 2 (two) times daily.    clopidogrel  (PLAVIX ) 75 MG tablet Take 1 tablet (75 mg total) by mouth daily with breakfast.   methylphenidate  (RITALIN ) 20 MG tablet Take 10-20 mg by mouth 3 (three) times daily with meals.   pantoprazole  (PROTONIX ) 40 MG tablet Take 1 tablet (40 mg total) by mouth 2 (two) times daily. Decrease to daily dosing after 30 days   [DISCONTINUED] amLODipine -benazepril  (LOTREL) 10-20 MG capsule Take 1 capsule by mouth daily.     Review of Systems  Patient Active Problem List   Diagnosis Date Noted   Atherosclerosis of native arteries of extremity with intermittent claudication (HCC) 07/20/2023   History of GI bleed 02/28/2023   History of colonic polyps    Hiatal hernia    Iron deficiency anemia 08/25/2014   DDD (degenerative disc disease), lumbosacral 08/25/2014   Recurrent major depressive  episodes (HCC) 08/25/2014   Essential (primary) hypertension 08/25/2014   HLD (hyperlipidemia) 08/25/2014   Restless leg syndrome 07/27/2008    Allergies  Allergen Reactions   Simvastatin     Other Reaction(s): myalgia arthralgia   Nsaids     Other Reaction(s): gi bleed    Immunization History  Administered Date(s) Administered   Influenza,inj,Quad PF,6+ Mos 03/28/2017, 01/02/2018, 02/01/2019, 03/09/2020   Influenza-Unspecified 01/20/2022   Moderna Sars-Covid-2 Vaccination 10/16/2019, 11/13/2019   Tdap 01/02/2018    Past Surgical History:  Procedure Laterality Date   COLONOSCOPY WITH PROPOFOL  N/A 12/29/2015   Procedure: COLONOSCOPY WITH PROPOFOL ;  Surgeon: Rogelia Copping, MD;  Location: [facility];  Service: Endoscopy;  Laterality: N/A;   COLONOSCOPY WITH PROPOFOL  N/A 11/29/2019   Procedure: COLONOSCOPY WITH PROPOFOL ;  Surgeon: Copping Rogelia, MD;  Location: [facility];  Service: Endoscopy;  Laterality: N/A;  priority 4   ESOPHAGEAL DILATION     stretching   ESOPHAGEAL DILATION  01/17/2017   Procedure: ESOPHAGEAL DILATION;  Surgeon: Copping Rogelia, MD;  Location: [facility];  Service: Gastroenterology;;   ESOPHAGOGASTRODUODENOSCOPY (EGD) WITH PROPOFOL  N/A 11/29/2016   Procedure: ESOPHAGOGASTRODUODENOSCOPY (EGD) WITH PROPOFOL ;  Surgeon: Copping Rogelia, MD;  Location: [facility] ENDOSCOPY;  Service: Endoscopy;  Laterality: N/A;   ESOPHAGOGASTRODUODENOSCOPY (EGD) WITH PROPOFOL  N/A 01/17/2017   Procedure: ESOPHAGOGASTRODUODENOSCOPY (EGD) WITH PROPOFOL ;  Surgeon: Copping Rogelia, MD;  Location: [facility];  Service: Gastroenterology;  Laterality: N/A;   HERNIA REPAIR Right    INGUINAL HERNIA REPAIR Left 01/14/2020   Procedure: HERNIA REPAIR INGUINAL ADULT;  Surgeon: Dessa Reyes ORN, MD;  Location: [facility] ORS;  Service: General;  Laterality: Left;   LOWER EXTREMITY ANGIOGRAPHY Right 02/28/2023   Procedure: Lower Extremity Angiography;  Surgeon: Jama Cordella MATSU,  MD;  Location: [facility] INVASIVE CV LAB;  Service: Cardiovascular;  Laterality: Right;   POLYPECTOMY N/A 12/29/2015   Procedure: POLYPECTOMY;  Surgeon: Rogelia Copping, MD;  Location: [facility];  Service: Endoscopy;  Laterality: N/A;   POLYPECTOMY  01/17/2017   Procedure: POLYPECTOMY;  Surgeon: Copping Rogelia, MD;  Location: [facility];  Service: Gastroenterology;;   POLYPECTOMY N/A 11/29/2019   Procedure: POLYPECTOMY;  Surgeon: Copping Rogelia, MD;  Location: [facility];  Service: Endoscopy;  Laterality: N/A;   SHOULDER SURGERY Right 09/20/2009   TOTAL HIP ARTHROPLASTY Right 12/21/2020   Procedure: TOTAL HIP ARTHROPLASTY ANTERIOR APPROACH;  Surgeon: Kathlynn Sharper, MD;  Location: [facility] ORS;  Service: Orthopedics;  Laterality: Right;    Social History   Tobacco Use   Smoking status: Former    Current packs/day: 0.00    Average packs/day: 2.0 packs/day for 30.0 years (60.0 ttl pk-yrs)    Types: Cigarettes    Start date: 71    Quit date: 2000    Years since quitting: 25.6   Smokeless tobacco: Current    Types: Chew  Vaping Use   Vaping status: Never Used  Substance Use Topics   Alcohol use: Yes    Alcohol/week: 12.0 standard drinks of alcohol    Types: 12 Cans of beer per week    Comment: BEER once a week   Drug use: No    Family History  Problem Relation Age of Onset   Ovarian cancer Mother    CAD Father         12/19/2023    7:56 AM 07/07/2023    8:27 AM 03/07/2023    1:25 PM 11/15/2022    1:50 PM  GAD 7 : Generalized Anxiety Score  Nervous, Anxious, on Edge 0 0 0 0  Control/stop worrying 0 0 0 0  Worry too much - different things 0 0 0 0  Trouble relaxing 0 0 0 0  Restless 0 0 0 0  Easily annoyed or irritable 0 0 0 0  Afraid - awful might happen 0 0 0 0  Total GAD 7 Score 0 0 0 0  Anxiety Difficulty Not difficult at all Not difficult at all Not difficult at all Not difficult at all       12/19/2023    7:56 AM 07/07/2023    8:27 AM 03/07/2023     1:25 PM  Depression screen PHQ 2/9  Decreased Interest 0 0 0  Down, Depressed, Hopeless 0 0 0  PHQ - 2 Score 0 0 0  Altered sleeping 0 0 0  Tired, decreased energy 0 0 1  Change in appetite 0 0 0  Feeling bad or failure about yourself  0 0 0  Trouble concentrating 0 0 0  Moving slowly or fidgety/restless 0 0 0  Suicidal thoughts 0 0 0  PHQ-9 Score 0 0 1  Difficult doing work/chores Not difficult at all Not difficult at all Not difficult at all    BP Readings from Last 3 Encounters:  12/19/23 120/78  07/21/23 (!) 159/85  07/07/23 118/62    Wt Readings from Last 3 Encounters:  12/19/23 183 lb (83 kg)  07/21/23 188 lb 6.4 oz (85.5 kg)  07/07/23 186 lb (84.4 kg)    BP 120/78   Pulse 76   Temp 98.2 F (36.8 C) (Oral)   Ht 5' 3 (1.6 m)   Wt 183 lb (83 kg)   SpO2 98%   BMI 32.42 kg/m   Physical Exam Vitals and nursing note reviewed.  Constitutional:      Appearance: Normal appearance.  Neck:     Vascular: No carotid bruit.  Cardiovascular:     Rate and Rhythm: Normal rate and regular rhythm.     Heart sounds: No murmur heard.    No friction rub. No gallop.  Pulmonary:     Effort: Pulmonary effort is normal.     Breath sounds: Normal breath sounds.  Abdominal:     General: There is no distension.  Musculoskeletal:        General: Normal range of motion.  Skin:    General: Skin is warm and dry.  Neurological:     Mental Status: He is alert and oriented to person, place, and time.     Gait: Gait is intact.  Psychiatric:        Mood and Affect: Mood normal. Affect is flat.     Recent Labs     Component Value Date/Time   NA 142 07/07/2023 0902   NA 139 04/16/2013 1607   K 4.3 07/07/2023 0902   K 4.0 04/16/2013 1607   CL 105 07/07/2023 0902   CL 97 (L) 04/16/2013 1607   CO2 23 07/07/2023 0902   CO2 31 04/16/2013 1607   GLUCOSE 150 (H) 07/07/2023 0902   GLUCOSE 98 04/10/2023 1848   GLUCOSE 106 (H) 04/16/2013 1607   BUN 11 07/07/2023 0902   BUN 10  04/16/2013 1607   CREATININE 1.23 07/07/2023 0902   CREATININE 1.12 04/16/2013 1607   CALCIUM  9.5 07/07/2023 0902   CALCIUM  9.6 04/16/2013 1607   PROT 8.3 (H) 03/08/2023 1914   PROT 6.8 11/15/2022 1436   PROT 8.2 04/16/2013 1607   ALBUMIN 4.3 07/07/2023 0902   ALBUMIN 3.8 04/16/2013 1607   AST 26 03/08/2023 1914   AST 21 04/16/2013 1607   ALT 27 03/08/2023 1914   ALT 32 04/16/2013 1607   ALKPHOS 65 03/08/2023 1914   ALKPHOS 74 04/16/2013 1607   BILITOT 0.5 03/08/2023 1914   BILITOT 0.5 11/15/2022 1436   BILITOT 0.3 04/16/2013 1607   GFRNONAA >60 04/10/2023 1848   GFRNONAA >60 04/16/2013 1607   GFRAA 69 03/09/2020 1423   GFRAA >60 04/16/2013 1607    Lab Results  Component Value Date   WBC 9.9 04/10/2023   HGB 13.8 04/10/2023   HCT 42.3 04/10/2023   MCV 97.2 04/10/2023   PLT 284 04/10/2023   No results found for: HGBA1C Lab Results  Component Value Date   CHOL 163 11/15/2022   HDL 51 11/15/2022   LDLCALC 92 11/15/2022   TRIG 108 11/15/2022   CHOLHDL 5.6 (H) 02/01/2019   No results found for: TSH    Assessment and Plan:  Essential (primary) hypertension Assessment & Plan: Well-controlled with current therapy, continue.  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  Orders: -     amLODIPine  Besy-Benazepril  HCl; Take 1 capsule by mouth daily.  Dispense: 90 capsule; Refill: 1 -     CBC with Differential/Platelet -     Comprehensive metabolic  panel with GFR  Mixed hyperlipidemia Assessment & Plan: Due to check fasting labs.  Continue atorvastatin  40 mg as prescribed  Orders: -     Lipid panel  Iron deficiency anemia, unspecified iron deficiency anemia type Assessment & Plan: Repeat CBC.   Orders: -     CBC with Differential/Platelet  Hyperglycemia -     Hemoglobin A1c  Screening PSA (prostate specific antigen) -     PSA     Return in about 6 months (around  06/19/2024) for OV f/u chronic conditions.    Rolan Hoyle, PA-C, DMSc, Nutritionist [facility] [phone]

## 2023-12-19 NOTE — Assessment & Plan Note (Signed)
 Repeat CBC.

## 2023-12-19 NOTE — Assessment & Plan Note (Signed)
 Due to check fasting labs.  Continue atorvastatin  40 mg as prescribed

## 2024-01-19 ENCOUNTER — Telehealth (INDEPENDENT_AMBULATORY_CARE_PROVIDER_SITE_OTHER): Payer: Self-pay | Admitting: Vascular Surgery

## 2024-01-19 ENCOUNTER — Encounter (INDEPENDENT_AMBULATORY_CARE_PROVIDER_SITE_OTHER): Payer: Self-pay

## 2024-01-19 NOTE — Telephone Encounter (Signed)
 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 Jama. Pt has an appt on Thursday please advise.

## 2024-01-19 NOTE — Telephone Encounter (Signed)
 Call to patient for clarification on below msg, he states that he is needing documentation to state that it is ok to drive while taking his atorvastatin  for his upcoming physical. Advised him that the only medication I see that Dr Jama prescribed for him was a pain medication in 2024, and his atorvastatin  was from the hospitalist MD and not out office specifically. Advised patient he may want to contact his primary care for this documentation as well as further refills. Pt voiced understanding, to let us  know if he has any further concerns or questions when he comes in next week for his appointment.

## 2024-01-21 ENCOUNTER — Other Ambulatory Visit (INDEPENDENT_AMBULATORY_CARE_PROVIDER_SITE_OTHER): Payer: Self-pay | Admitting: Vascular Surgery

## 2024-01-21 DIAGNOSIS — I70213 Atherosclerosis of native arteries of extremities with intermittent claudication, bilateral legs: Secondary | ICD-10-CM

## 2024-01-22 ENCOUNTER — Other Ambulatory Visit (INDEPENDENT_AMBULATORY_CARE_PROVIDER_SITE_OTHER)

## 2024-01-22 ENCOUNTER — Encounter (INDEPENDENT_AMBULATORY_CARE_PROVIDER_SITE_OTHER): Payer: Self-pay | Admitting: Nurse Practitioner

## 2024-01-22 ENCOUNTER — Ambulatory Visit (INDEPENDENT_AMBULATORY_CARE_PROVIDER_SITE_OTHER)

## 2024-01-22 ENCOUNTER — Ambulatory Visit (INDEPENDENT_AMBULATORY_CARE_PROVIDER_SITE_OTHER): Admitting: Vascular Surgery

## 2024-01-22 DIAGNOSIS — I70213 Atherosclerosis of native arteries of extremities with intermittent claudication, bilateral legs: Secondary | ICD-10-CM

## 2024-01-23 LAB — VAS US ABI WITH/WO TBI
Left ABI: 1.17
Right ABI: 1.24

## 2024-02-12 ENCOUNTER — Encounter (INDEPENDENT_AMBULATORY_CARE_PROVIDER_SITE_OTHER): Payer: Self-pay | Admitting: Vascular Surgery

## 2024-02-12 ENCOUNTER — Ambulatory Visit (INDEPENDENT_AMBULATORY_CARE_PROVIDER_SITE_OTHER): Admitting: Vascular Surgery

## 2024-02-12 VITALS — BP 143/81 | HR 80 | Resp 18 | Wt 184.2 lb

## 2024-02-12 DIAGNOSIS — I70213 Atherosclerosis of native arteries of extremities with intermittent claudication, bilateral legs: Secondary | ICD-10-CM

## 2024-02-12 DIAGNOSIS — I10 Essential (primary) hypertension: Secondary | ICD-10-CM | POA: Diagnosis not present

## 2024-02-12 DIAGNOSIS — M51379 Other intervertebral disc degeneration, lumbosacral region without mention of lumbar back pain or lower extremity pain: Secondary | ICD-10-CM | POA: Diagnosis not present

## 2024-02-12 DIAGNOSIS — E781 Pure hyperglyceridemia: Secondary | ICD-10-CM

## 2024-02-22 ENCOUNTER — Encounter (INDEPENDENT_AMBULATORY_CARE_PROVIDER_SITE_OTHER): Payer: Self-pay | Admitting: Vascular Surgery

## 2024-02-22 NOTE — Progress Notes (Signed)
 MRN : [id number]  Alan Trevino is a 63 y.o. ([birth year]) male who presents with chief complaint of check circulation.  History of Present Illness:   The patient returns to the office for followup and review status post angiogram with intervention on 02/28/2023.    Procedure:  Percutaneous transluminal angioplasty and stent placement right superficial femoral artery    The patient notes improvement in the lower extremity symptoms. No interval shortening of the patient's claudication distance or rest pain symptoms. No new ulcers or wounds have occurred since the last visit.   There have been no significant changes to the patient's overall health care.   No documented history of amaurosis fugax or recent TIA symptoms. There are no recent neurological changes noted. No documented history of DVT, PE or superficial thrombophlebitis. The patient denies recent episodes of angina or shortness of breath.    ABI's Rt=1.24(Triphasic) and Lt=1.17(Triphasic)  (previous ABI's Rt=1.11(Triphasic) and Lt=1.11(Triphasic)).   Duplex ultrasound of the right lower extremity dated 07/03/2023 demonstrates uniform velocities throughout the femoral-popliteal system.  The stent is widely patent.  Distal runoff is preserved  Current Meds  Medication Sig   amLODipine -benazepril  (LOTREL) 10-20 MG capsule Take 1 capsule by mouth daily.   aspirin  81 MG chewable tablet Chew 1 tablet (81 mg total) by mouth daily.   atorvastatin  (LIPITOR) 40 MG tablet Take 1 tablet (40 mg total) by mouth daily. TAKE 1 TABLET BY MOUTH EVERY DAY   buPROPion  (WELLBUTRIN  XL) 300 MG 24 hr tablet Take 300 mg by mouth daily.    busPIRone  (BUSPAR ) 30 MG tablet Take 30 mg by mouth 2 (two) times daily.    clopidogrel  (PLAVIX ) 75 MG tablet Take 1 tablet (75 mg total) by mouth daily with breakfast.   methylphenidate  (RITALIN ) 20 MG tablet Take 10-20 mg by mouth 3 (three)  times daily with meals.    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   Stricture and stenosis of esophagus    Upper GI bleed 11/28/2016   Wears dentures    full upper    Past Surgical History:  Procedure Laterality Date   COLONOSCOPY WITH PROPOFOL  N/A 12/29/2015   Procedure: COLONOSCOPY WITH PROPOFOL ;  Surgeon: Rogelia Copping, MD;  Location: [facility];  Service: Endoscopy;  Laterality: N/A;   COLONOSCOPY WITH PROPOFOL  N/A 11/29/2019   Procedure: COLONOSCOPY WITH PROPOFOL ;  Surgeon: Copping Rogelia, MD;  Location: [facility];  Service: Endoscopy;  Laterality: N/A;  priority 4   ESOPHAGEAL DILATION     stretching   ESOPHAGEAL DILATION  01/17/2017   Procedure: ESOPHAGEAL DILATION;  Surgeon:  Jinny Carmine, MD;  Location: [facility];  Service: Gastroenterology;;   ESOPHAGOGASTRODUODENOSCOPY (EGD) WITH PROPOFOL  N/A 11/29/2016   Procedure: ESOPHAGOGASTRODUODENOSCOPY (EGD) WITH PROPOFOL ;  Surgeon: Jinny Carmine, MD;  Location: [facility] ENDOSCOPY;  Service: Endoscopy;  Laterality: N/A;   ESOPHAGOGASTRODUODENOSCOPY (EGD) WITH PROPOFOL  N/A 01/17/2017   Procedure: ESOPHAGOGASTRODUODENOSCOPY (EGD) WITH PROPOFOL ;  Surgeon: Jinny Carmine, MD;  Location: [facility];  Service: Gastroenterology;  Laterality: N/A;   HERNIA REPAIR Right    INGUINAL HERNIA REPAIR Left 01/14/2020   Procedure: HERNIA REPAIR INGUINAL ADULT;  Surgeon: Dessa Reyes ORN, MD;  Location: [facility] ORS;  Service: General;  Laterality: Left;   LOWER EXTREMITY ANGIOGRAPHY  Right 02/28/2023   Procedure: Lower Extremity Angiography;  Surgeon: Jama Cordella MATSU, MD;  Location: [facility] INVASIVE CV LAB;  Service: Cardiovascular;  Laterality: Right;   POLYPECTOMY N/A 12/29/2015   Procedure: POLYPECTOMY;  Surgeon: Carmine Jinny, MD;  Location: [facility];  Service: Endoscopy;  Laterality: N/A;   POLYPECTOMY  01/17/2017   Procedure: POLYPECTOMY;  Surgeon: Jinny Carmine, MD;  Location: [facility];  Service: Gastroenterology;;   POLYPECTOMY N/A 11/29/2019   Procedure: POLYPECTOMY;  Surgeon: Jinny Carmine, MD;  Location: [facility];  Service: Endoscopy;  Laterality: N/A;   SHOULDER SURGERY Right 09/20/2009   TOTAL HIP ARTHROPLASTY Right 12/21/2020   Procedure: TOTAL HIP ARTHROPLASTY ANTERIOR APPROACH;  Surgeon: Kathlynn Sharper, MD;  Location: [facility] ORS;  Service: Orthopedics;  Laterality: Right;    Social History Social History   Tobacco Use   Smoking status: Former    Current packs/day: 0.00    Average packs/day: 2.0 packs/day for 30.0 years (60.0 ttl pk-yrs)    Types: Cigarettes    Start date: 20    Quit date: 2000    Years since quitting: 25.8   Smokeless tobacco: Current    Types: Chew  Vaping Use   Vaping status: Never Used  Substance Use Topics   Alcohol use: Yes    Alcohol/week: 12.0 standard drinks of alcohol    Types: 12 Cans of beer per week    Comment: BEER once a week   Drug use: No    Family History Family History  Problem Relation Age of Onset   Ovarian cancer Mother    CAD Father     Allergies  Allergen Reactions   Simvastatin     Other Reaction(s): myalgia arthralgia   Nsaids     Other Reaction(s): gi bleed     REVIEW OF SYSTEMS (Negative unless checked)  Constitutional: [] Weight loss  [] Fever  [] Chills Cardiac: [] Chest pain   [] Chest pressure   [] Palpitations   [] Shortness of breath when laying flat   [] Shortness of breath with exertion. Vascular:  [x] Pain in legs with walking   [] Pain in legs at rest   [] History of DVT   [] Phlebitis   [] Swelling in legs   [] Varicose veins   [] Non-healing ulcers Pulmonary:   [] Uses home oxygen   [] Productive cough   [] Hemoptysis   [] Wheeze  [] COPD   [] Asthma Neurologic:  [] Dizziness   [] Seizures   [] History of stroke   [] History of TIA  [] Aphasia   [] Vissual changes   [] Weakness or numbness in arm   [] Weakness or numbness in leg Musculoskeletal:   [] Joint swelling   [] Joint pain   [] Low back pain Hematologic:  [] Easy bruising  [] Easy bleeding   [] Hypercoagulable state   [] Anemic Gastrointestinal:  [] Diarrhea   [] Vomiting  [] Gastroesophageal reflux/heartburn   [] Difficulty swallowing. Genitourinary:  [] Chronic kidney  disease   [] Difficult urination  [] Frequent urination   [] Blood in urine Skin:  [] Rashes   [] Ulcers  Psychological:  [] History of anxiety   []  History of major depression.  Physical Examination  Vitals:   02/12/24 1129  BP: (!) 143/81  Pulse: 80  Resp: 18  Weight: 184 lb 3.2 oz (83.6 kg)   Body mass index is 32.63 kg/m. Gen: WD/WN, NAD Head: [place]/AT, No temporalis wasting.  Ear/Nose/Throat: Hearing grossly intact, nares w/o erythema or drainage Eyes: PER, EOMI, sclera nonicteric.  Neck: Supple, no masses.  No bruit or JVD.  Pulmonary:  Good air movement, no audible wheezing, no use of accessory muscles.  Cardiac: RRR, normal S1, S2, no Murmurs. Vascular:  mild trophic changes, no open wounds Vessel Right Left  Radial Palpable Palpable  PT Not Palpable Not Palpable  DP Not Palpable Not Palpable  Gastrointestinal: soft, non-distended. No guarding/no peritoneal signs.  Musculoskeletal: M/S 5/5 throughout.  No visible deformity.  Neurologic: CN 2-12 intact. Pain and light touch intact in extremities.  Symmetrical.  Speech is fluent. Motor exam as listed above. Psychiatric: Judgment intact, Mood & affect appropriate for pt's clinical situation. Dermatologic: No rashes or ulcers noted.  No changes consistent with cellulitis.   CBC Lab  Results  Component Value Date   WBC 9.9 04/10/2023   HGB 13.8 04/10/2023   HCT 42.3 04/10/2023   MCV 97.2 04/10/2023   PLT 284 04/10/2023    BMET    Component Value Date/Time   NA 142 07/07/2023 0902   NA 139 04/16/2013 1607   K 4.3 07/07/2023 0902   K 4.0 04/16/2013 1607   CL 105 07/07/2023 0902   CL 97 (L) 04/16/2013 1607   CO2 23 07/07/2023 0902   CO2 31 04/16/2013 1607   GLUCOSE 150 (H) 07/07/2023 0902   GLUCOSE 98 04/10/2023 1848   GLUCOSE 106 (H) 04/16/2013 1607   BUN 11 07/07/2023 0902   BUN 10 04/16/2013 1607   CREATININE 1.23 07/07/2023 0902   CREATININE 1.12 04/16/2013 1607   CALCIUM  9.5 07/07/2023 0902   CALCIUM  9.6 04/16/2013 1607   GFRNONAA >60 04/10/2023 1848   GFRNONAA >60 04/16/2013 1607   GFRAA 69 03/09/2020 1423   GFRAA >60 04/16/2013 1607   CrCl cannot be calculated (Patient's most recent lab result is older than the maximum 21 days allowed.).  COAG Lab Results  Component Value Date   INR 1.1 02/28/2023    Radiology No results found.   Assessment/Plan 1. Atherosclerosis of native artery of both lower extremities with intermittent claudication (Primary)  Recommend:  The patient has evidence of atherosclerosis of the lower extremities with claudication.  The patient does not voice lifestyle limiting changes at this point in time.  Noninvasive studies do not suggest clinically significant change.  No invasive studies, angiography or surgery at this time The patient should continue walking and begin a more formal exercise program.  The patient should continue antiplatelet therapy and aggressive treatment of the lipid abnormalities  No changes in the patient's medications at this time  Continued surveillance is indicated as atherosclerosis is likely to progress with time.    The patient will continue follow up with noninvasive studies as ordered.  - VAS US  LOWER EXTREMITY ARTERIAL DUPLEX; Future - VAS US  ABI WITH/WO TBI; Future  2.  Essential (primary) hypertension Continue antihypertensive medications as already ordered, these medications have been reviewed and there are no changes at this time.  3. Pure hypertriglyceridemia Continue statin as  ordered and reviewed, no changes at this time  4. Degeneration of intervertebral disc of lumbosacral region, unspecified whether pain present Continue medications to treat the patient's degenerative disease as already ordered, these medications have been reviewed and there are no changes at this time.  Continued activity and therapy was stressed.    Cordella Shawl, MD  02/22/2024 1:56 PM

## 2024-03-24 ENCOUNTER — Other Ambulatory Visit: Payer: Self-pay | Admitting: Physician Assistant

## 2024-03-24 ENCOUNTER — Other Ambulatory Visit: Payer: Self-pay

## 2024-03-24 MED ORDER — CYCLOBENZAPRINE HCL 5 MG PO TABS: 5.0000 mg | ORAL_TABLET | Freq: Three times a day (TID) | ORAL | 1 refills | Status: AC | PRN

## 2024-03-24 NOTE — Progress Notes (Signed)
 Refill for cyclobenzaprine  received via fax.

## 2024-05-13 ENCOUNTER — Encounter
Admission: EM | Disposition: A | Discharge status: Home / Self Care | Payer: Self-pay | Source: Home / Self Care | Attending: Internal Medicine

## 2024-05-13 ENCOUNTER — Inpatient Hospital Stay
Admission: EM | Admit: 2024-05-13 | Discharge: 2024-05-14 | DRG: 272 | Disposition: A | Discharge status: Home / Self Care | Attending: Osteopathic Medicine | Admitting: Osteopathic Medicine

## 2024-05-13 ENCOUNTER — Other Ambulatory Visit: Payer: Self-pay

## 2024-05-13 DIAGNOSIS — Z96641 Presence of right artificial hip joint: Secondary | ICD-10-CM | POA: Diagnosis present

## 2024-05-13 DIAGNOSIS — I743 Embolism and thrombosis of arteries of the lower extremities: Secondary | ICD-10-CM | POA: Diagnosis present

## 2024-05-13 DIAGNOSIS — Z8041 Family history of malignant neoplasm of ovary: Secondary | ICD-10-CM

## 2024-05-13 DIAGNOSIS — Z79899 Other long term (current) drug therapy: Secondary | ICD-10-CM | POA: Diagnosis not present

## 2024-05-13 DIAGNOSIS — E785 Hyperlipidemia, unspecified: Secondary | ICD-10-CM | POA: Diagnosis present

## 2024-05-13 DIAGNOSIS — M79604 Pain in right leg: Principal | ICD-10-CM

## 2024-05-13 DIAGNOSIS — Z886 Allergy status to analgesic agent status: Secondary | ICD-10-CM

## 2024-05-13 DIAGNOSIS — Z87891 Personal history of nicotine dependence: Secondary | ICD-10-CM

## 2024-05-13 DIAGNOSIS — R0989 Other specified symptoms and signs involving the circulatory and respiratory systems: Secondary | ICD-10-CM

## 2024-05-13 DIAGNOSIS — E66811 Obesity, class 1: Secondary | ICD-10-CM | POA: Diagnosis present

## 2024-05-13 DIAGNOSIS — Z888 Allergy status to other drugs, medicaments and biological substances status: Secondary | ICD-10-CM | POA: Diagnosis not present

## 2024-05-13 DIAGNOSIS — I70221 Atherosclerosis of native arteries of extremities with rest pain, right leg: Secondary | ICD-10-CM | POA: Diagnosis present

## 2024-05-13 DIAGNOSIS — E782 Mixed hyperlipidemia: Secondary | ICD-10-CM

## 2024-05-13 DIAGNOSIS — I10 Essential (primary) hypertension: Secondary | ICD-10-CM | POA: Diagnosis present

## 2024-05-13 DIAGNOSIS — I998 Other disorder of circulatory system: Secondary | ICD-10-CM | POA: Diagnosis present

## 2024-05-13 DIAGNOSIS — F32A Depression, unspecified: Secondary | ICD-10-CM | POA: Diagnosis present

## 2024-05-13 DIAGNOSIS — Z6830 Body mass index (BMI) 30.0-30.9, adult: Secondary | ICD-10-CM

## 2024-05-13 DIAGNOSIS — F419 Anxiety disorder, unspecified: Secondary | ICD-10-CM | POA: Diagnosis present

## 2024-05-13 DIAGNOSIS — Z8711 Personal history of peptic ulcer disease: Secondary | ICD-10-CM

## 2024-05-13 DIAGNOSIS — T82868A Thrombosis of vascular prosthetic devices, implants and grafts, initial encounter: Secondary | ICD-10-CM | POA: Diagnosis not present

## 2024-05-13 DIAGNOSIS — Z8249 Family history of ischemic heart disease and other diseases of the circulatory system: Secondary | ICD-10-CM

## 2024-05-13 DIAGNOSIS — K219 Gastro-esophageal reflux disease without esophagitis: Secondary | ICD-10-CM | POA: Diagnosis present

## 2024-05-13 DIAGNOSIS — F909 Attention-deficit hyperactivity disorder, unspecified type: Secondary | ICD-10-CM | POA: Diagnosis present

## 2024-05-13 DIAGNOSIS — Z91199 Patient's noncompliance with other medical treatment and regimen due to unspecified reason: Secondary | ICD-10-CM | POA: Diagnosis not present

## 2024-05-13 DIAGNOSIS — Z9889 Other specified postprocedural states: Secondary | ICD-10-CM

## 2024-05-13 DIAGNOSIS — I739 Peripheral vascular disease, unspecified: Secondary | ICD-10-CM

## 2024-05-13 DIAGNOSIS — Z95828 Presence of other vascular implants and grafts: Secondary | ICD-10-CM | POA: Diagnosis not present

## 2024-05-13 HISTORY — DX: Other disorder of circulatory system: I99.8

## 2024-05-13 LAB — CBC WITH DIFFERENTIAL/PLATELET
Abs Immature Granulocytes: 0.03 K/uL (ref 0.00–0.07)
Basophils Absolute: 0 K/uL (ref 0.0–0.1)
Basophils Relative: 0 %
Eosinophils Absolute: 0.2 K/uL (ref 0.0–0.5)
Eosinophils Relative: 2 %
HCT: 46.4 % (ref 39.0–52.0)
Hemoglobin: 15.5 g/dL (ref 13.0–17.0)
Immature Granulocytes: 0 %
Lymphocytes Relative: 12 %
Lymphs Abs: 1.2 K/uL (ref 0.7–4.0)
MCH: 31.7 pg (ref 26.0–34.0)
MCHC: 33.4 g/dL (ref 30.0–36.0)
MCV: 94.9 fL (ref 80.0–100.0)
Monocytes Absolute: 0.7 K/uL (ref 0.1–1.0)
Monocytes Relative: 8 %
Neutro Abs: 7.5 K/uL (ref 1.7–7.7)
Neutrophils Relative %: 78 %
Platelets: 187 K/uL (ref 150–400)
RBC: 4.89 MIL/uL (ref 4.22–5.81)
RDW: 12.5 % (ref 11.5–15.5)
WBC: 9.6 K/uL (ref 4.0–10.5)
nRBC: 0 % (ref 0.0–0.2)

## 2024-05-13 LAB — PROTIME-INR
INR: 1 (ref 0.8–1.2)
Prothrombin Time: 13.5 s (ref 11.4–15.2)

## 2024-05-13 LAB — BASIC METABOLIC PANEL WITH GFR
Anion gap: 12 (ref 5–15)
BUN: 9 mg/dL (ref 8–23)
CO2: 23 mmol/L (ref 22–32)
Calcium: 9.3 mg/dL (ref 8.9–10.3)
Chloride: 106 mmol/L (ref 98–111)
Creatinine, Ser: 1.11 mg/dL (ref 0.61–1.24)
GFR, Estimated: >60 mL/min
Glucose, Bld: 144 mg/dL — ABNORMAL HIGH (ref 70–99)
Potassium: 3.8 mmol/L (ref 3.5–5.1)
Sodium: 141 mmol/L (ref 135–145)

## 2024-05-13 LAB — HEPARIN LEVEL (UNFRACTIONATED): Heparin Unfractionated: >1.1 [IU]/mL — ABNORMAL HIGH (ref 0.30–0.70)

## 2024-05-13 LAB — LACTIC ACID, PLASMA: Lactic Acid, Venous: 2.6 mmol/L (ref 0.5–1.9)

## 2024-05-13 LAB — APTT: aPTT: 27 s (ref 24–36)

## 2024-05-13 MED ORDER — HEPARIN (PORCINE) 25000 UT/250ML-% IV SOLN
1250.0000 [IU]/h | INTRAVENOUS | Status: DC
  Administered 2024-05-13: 1250 [IU]/h via INTRAVENOUS
  Filled 2024-05-13: qty 250

## 2024-05-13 MED ORDER — ACETAMINOPHEN 650 MG RE SUPP: 650.0000 mg | Freq: Four times a day (QID) | RECTAL | Status: DC | PRN

## 2024-05-13 MED ORDER — HYDROCODONE-ACETAMINOPHEN 5-325 MG PO TABS
1.0000 | ORAL_TABLET | ORAL | Status: DC | PRN
  Administered 2024-05-13: 1 via ORAL
  Administered 2024-05-13 – 2024-05-14 (×2): 2 via ORAL
  Filled 2024-05-13 (×2): qty 2
  Filled 2024-05-13: qty 1

## 2024-05-13 MED ORDER — BUSPIRONE HCL 15 MG PO TABS
30.0000 mg | ORAL_TABLET | Freq: Two times a day (BID) | ORAL | Status: DC
  Administered 2024-05-14: 30 mg via ORAL
  Filled 2024-05-13 (×3): qty 2

## 2024-05-13 MED ORDER — CLOPIDOGREL BISULFATE 75 MG PO TABS
75.0000 mg | ORAL_TABLET | Freq: Every day | ORAL | Status: DC
  Administered 2024-05-14: 75 mg via ORAL
  Filled 2024-05-13: qty 1

## 2024-05-13 MED ORDER — MIDAZOLAM HCL (PF) 2 MG/2ML IJ SOLN
INTRAMUSCULAR | Status: DC | PRN
  Administered 2024-05-13: 2 mg via INTRAVENOUS
  Administered 2024-05-13 (×3): .5 mg via INTRAVENOUS

## 2024-05-13 MED ORDER — HEPARIN SODIUM (PORCINE) 1000 UNIT/ML IJ SOLN
INTRAMUSCULAR | Status: DC | PRN
  Administered 2024-05-13: 5000 [IU] via INTRAVENOUS

## 2024-05-13 MED ORDER — HEPARIN (PORCINE) IN NACL 1000-0.9 UT/500ML-% IV SOLN
INTRAVENOUS | Status: DC | PRN
  Administered 2024-05-13: 1000 mL

## 2024-05-13 MED ORDER — ONDANSETRON HCL 4 MG PO TABS: 4.0000 mg | ORAL_TABLET | Freq: Four times a day (QID) | ORAL | Status: DC | PRN

## 2024-05-13 MED ORDER — HYDROMORPHONE HCL 1 MG/ML IJ SOLN
0.5000 mg | INTRAMUSCULAR | Status: DC | PRN
  Administered 2024-05-14: 1 mg via INTRAVENOUS
  Filled 2024-05-13: qty 1

## 2024-05-13 MED ORDER — BISACODYL 5 MG PO TBEC: 5.0000 mg | DELAYED_RELEASE_TABLET | Freq: Every day | ORAL | Status: DC | PRN

## 2024-05-13 MED ORDER — METHYLPHENIDATE HCL 10 MG PO TABS: 10.0000 mg | ORAL_TABLET | Freq: Every day | ORAL | Status: DC

## 2024-05-13 MED ORDER — METHYLPHENIDATE HCL 20 MG PO TABS
20.0000 mg | ORAL_TABLET | Freq: Two times a day (BID) | ORAL | Status: DC
  Administered 2024-05-13: 20 mg via ORAL
  Filled 2024-05-13: qty 4
  Filled 2024-05-13 (×2): qty 1

## 2024-05-13 MED ORDER — CYCLOBENZAPRINE HCL 10 MG PO TABS: 5.0000 mg | ORAL_TABLET | Freq: Three times a day (TID) | ORAL | Status: DC | PRN

## 2024-05-13 MED ORDER — HEPARIN BOLUS VIA INFUSION
5000.0000 [IU] | Freq: Once | INTRAVENOUS | Status: AC
  Administered 2024-05-13: 5000 [IU] via INTRAVENOUS
  Filled 2024-05-13: qty 5000

## 2024-05-13 MED ORDER — ATORVASTATIN CALCIUM 20 MG PO TABS
40.0000 mg | ORAL_TABLET | Freq: Every day | ORAL | Status: DC
  Administered 2024-05-14: 40 mg via ORAL
  Filled 2024-05-13: qty 2

## 2024-05-13 MED ORDER — HEPARIN (PORCINE) 25000 UT/250ML-% IV SOLN
1050.0000 [IU]/h | INTRAVENOUS | Status: DC
  Administered 2024-05-13: 1050 [IU]/h via INTRAVENOUS
  Filled 2024-05-13: qty 250

## 2024-05-13 MED ORDER — HEPARIN SODIUM (PORCINE) 1000 UNIT/ML IJ SOLN
INTRAMUSCULAR | Status: AC
  Filled 2024-05-13: qty 10

## 2024-05-13 MED ORDER — TRAZODONE HCL 50 MG PO TABS: 25.0000 mg | ORAL_TABLET | Freq: Every evening | ORAL | Status: DC | PRN

## 2024-05-13 MED ORDER — ASPIRIN 81 MG PO TBEC
81.0000 mg | DELAYED_RELEASE_TABLET | Freq: Every day | ORAL | Status: DC
  Administered 2024-05-14: 81 mg via ORAL
  Filled 2024-05-13: qty 1

## 2024-05-13 MED ORDER — AMLODIPINE BESYLATE 10 MG PO TABS
10.0000 mg | ORAL_TABLET | Freq: Every day | ORAL | Status: DC
  Administered 2024-05-14: 10 mg via ORAL
  Filled 2024-05-13: qty 1

## 2024-05-13 MED ORDER — ROSUVASTATIN CALCIUM 10 MG PO TABS
10.0000 mg | ORAL_TABLET | Freq: Every day | ORAL | Status: DC
  Administered 2024-05-13 – 2024-05-14 (×2): 10 mg via ORAL
  Filled 2024-05-13 (×3): qty 1

## 2024-05-13 MED ORDER — LIDOCAINE-EPINEPHRINE (PF) 1 %-1:200000 IJ SOLN
INTRAMUSCULAR | Status: DC | PRN
  Administered 2024-05-13: 10 mL

## 2024-05-13 MED ORDER — ACETAMINOPHEN 325 MG PO TABS: 650.0000 mg | ORAL_TABLET | Freq: Four times a day (QID) | ORAL | Status: DC | PRN

## 2024-05-13 MED ORDER — ONDANSETRON HCL 4 MG/2ML IJ SOLN: 4.0000 mg | Freq: Four times a day (QID) | INTRAMUSCULAR | Status: DC | PRN

## 2024-05-13 MED ORDER — FENTANYL CITRATE (PF) 100 MCG/2ML IJ SOLN
INTRAMUSCULAR | Status: AC
  Filled 2024-05-13: qty 2

## 2024-05-13 MED ORDER — FENTANYL CITRATE (PF) 100 MCG/2ML IJ SOLN
INTRAMUSCULAR | Status: DC | PRN
  Administered 2024-05-13: 25 ug via INTRAVENOUS
  Administered 2024-05-13: 50 ug via INTRAVENOUS
  Administered 2024-05-13: 25 ug via INTRAVENOUS
  Administered 2024-05-13: 50 ug via INTRAVENOUS
  Administered 2024-05-13: 25 ug via INTRAVENOUS

## 2024-05-13 MED ORDER — CEFAZOLIN SODIUM-DEXTROSE 2-4 GM/100ML-% IV SOLN
INTRAVENOUS | Status: AC
  Filled 2024-05-13: qty 100

## 2024-05-13 MED ORDER — IODIXANOL 320 MG/ML IV SOLN
INTRAVENOUS | Status: DC | PRN
  Administered 2024-05-13: 45 mL

## 2024-05-13 MED ORDER — METHYLPHENIDATE HCL 5 MG PO TABS: 10.0000 mg | ORAL_TABLET | Freq: Three times a day (TID) | ORAL | Status: DC

## 2024-05-13 MED ORDER — CEFAZOLIN SODIUM-DEXTROSE 1-4 GM/50ML-% IV SOLN
INTRAVENOUS | Status: DC | PRN
  Administered 2024-05-13: 2 g via INTRAVENOUS

## 2024-05-13 MED ORDER — BUPROPION HCL ER (XL) 150 MG PO TB24
300.0000 mg | ORAL_TABLET | Freq: Every day | ORAL | Status: DC
  Administered 2024-05-14: 300 mg via ORAL
  Filled 2024-05-13: qty 2

## 2024-05-13 MED ORDER — MIDAZOLAM HCL 5 MG/5ML IJ SOLN
INTRAMUSCULAR | Status: AC
  Filled 2024-05-13: qty 5

## 2024-05-13 MED ORDER — BENAZEPRIL HCL 20 MG PO TABS
20.0000 mg | ORAL_TABLET | Freq: Every day | ORAL | Status: DC
  Administered 2024-05-14: 20 mg via ORAL
  Filled 2024-05-13: qty 1

## 2024-05-13 NOTE — Interval H&P Note (Signed)
 History and Physical Interval Note:  05/13/2024 3:36 PM  Alan Trevino  has presented today for surgery, with the diagnosis of Ischemia.  The various methods of treatment have been discussed with the patient and family. After consideration of risks, benefits and other options for treatment, the patient has consented to  Procedures: Lower Extremity Angiography (Right) as a surgical intervention.  The patient's history has been reviewed, patient examined, no change in status, stable for surgery.  I have reviewed the patient's chart and labs.  Questions were answered to the patient's satisfaction.     [name]

## 2024-05-13 NOTE — Op Note (Signed)
 [facility]  Percutaneous Study/Intervention Procedural Note   Date of Surgery: 05/13/2024  Surgeon(s):Payten Beaumier    Assistants:none  Pre-operative Diagnosis: PAD with recurrent rest pain right lower extremity, thrombosis of previous intervention on noninvasive study  Post-operative diagnosis:  Same  Procedure(s) Performed:             1.  Ultrasound guidance for vascular access left femoral artery             2.  Catheter placement into right common femoral artery from left femoral approach             3.  Aortogram and selective right lower extremity angiogram             4.  Mechanical thrombectomy to the right SFA with the Rota Rex device for thrombosis and occlusion             5.  Viabahn covered stent placement to the right SFA with 6 mm diameter by 15 cm length Viabahn stent for complex lesion  6.  StarClose closure device left femoral artery  EBL: 50 cc  Contrast: 45 cc  Fluoro Time: 5.3 minutes  Moderate Conscious Sedation Time: approximately 28 minutes using 3.5 mg of Versed  and 175 mcg of Fentanyl               Indications:  Patient is a 64 y.o.male with thrombosis of his previous intervention and recurrent rest pain symptoms of the right lower extremity. The patient has noninvasive study showing SFA stent thrombosis. The patient is brought in for angiography for further evaluation and potential treatment.  Due to the limb threatening nature of the situation, angiogram was performed for attempted limb salvage. The patient is aware that if the procedure fails, amputation would be expected.  The patient also understands that even with successful revascularization, amputation may still be required due to the severity of the situation.  Risks and benefits are discussed and informed consent is obtained.   Procedure:  The patient was identified and appropriate procedural time out was performed.  The patient was then placed supine on the table and prepped  and draped in the usual sterile fashion. Moderate conscious sedation was administered during a face to face encounter with the patient throughout the procedure with my supervision of the RN administering medicines and monitoring the patient's vital signs, pulse oximetry, telemetry and mental status throughout from the start of the procedure until the patient was taken to the recovery room. Ultrasound was used to evaluate the left common femoral artery.  It was patent .  A digital ultrasound image was acquired.  A Seldinger needle was used to access the left common femoral artery under direct ultrasound guidance and a permanent image was performed.  A 0.035 J wire was advanced without resistance and a 5Fr sheath was placed.  Pigtail catheter was placed into the aorta and an AP aortogram was performed. This demonstrated normal renal arteries and normal aorta and iliac segments without significant stenosis. I then crossed the aortic bifurcation and advanced to the right femoral head. Selective right lower extremity angiogram was then performed. This demonstrated thrombosis and occlusion of the right SFA starting a few centimeters above the previously placed stent with reconstitution of the distal SFA below the previously placed stent.  There was also a tail of thrombus at the distal edge of the previously placed stent that could be seen as the reconstitution occurred.  After this occlusion, the popliteal artery normalized  and there was three-vessel runoff distally without focal tibial disease. It was felt that it was in the patient's best interest to proceed with intervention after these images to avoid a second procedure and a larger amount of contrast and fluoroscopy based off of the findings from the initial angiogram. The patient was systemically heparinized and a 6 French Ansell sheath was then placed over the [employer] wire. I then used a Kumpe catheter and the advantage wire to easily cross the thrombotic  occlusion in the right SFA and confirm intraluminal flow in the popliteal artery where I exchanged for a V18 wire.  I then made 4 passes with the Rota Rex device in the right SFA due to the occlusive thrombus in the SFA stent and just above and below the SFA stent.  Following this, there was marked improvement in the thrombus but there remained some residual thrombus and there was still flow limitation within the stent and a covered stent was indicated.  A 6 mm diameter by 15 cm length Viabahn stent was selected and deployed in the right SFA and then postdilated with a 6 mm diameter Lutonix drug-coated balloon with excellent angiograph completion result and less than 10% residual stenosis after stent placement with preserved flow distally. I elected to terminate the procedure. The sheath was removed and StarClose closure device was deployed in the left femoral artery with excellent hemostatic result. The patient was taken to the recovery room in stable condition having tolerated the procedure well.  Findings:               Aortogram:  This demonstrated normal renal arteries and normal aorta and iliac segments without significant stenosis.             Right Lower Extremity:  This demonstrated thrombosis and occlusion of the right SFA starting a few centimeters above the previously placed stent with reconstitution of the distal SFA below the previously placed stent.  There was also a tail of thrombus at the distal edge of the previously placed stent that could be seen as the reconstitution occurred.  After this occlusion, the popliteal artery normalized and there was three-vessel runoff distally without focal tibial disease   Disposition: Patient was taken to the recovery room in stable condition having tolerated the procedure well.  Complications: None  Selinda Gu 05/13/2024 5:16 PM   This note was created with Dragon Medical transcription system. Any errors in dictation are purely unintentional.

## 2024-05-13 NOTE — ED Triage Notes (Signed)
 Pt to ED for right leg and right foot intermittent numbness started Tuesday night. Reports has stents to right leg. States toes to right foot feeling cold the past few days. Skin warm to touch currently.

## 2024-05-13 NOTE — Progress Notes (Signed)
 PHARMACY - ANTICOAGULATION CONSULT NOTE  Pharmacy Consult for Heparin  infusion Indication: R/O stent thrombosis to RLE  Allergies[1]  Patient Measurements: Height: 5' 3 (160 cm) Weight: 77.1 kg (170 lb) IBW/kg (Calculated) : 56.9 HEPARIN  DW (KG): 72.9  Vital Signs: Temp: 98 F (36.7 C) (01/22 1818) Temp Source: Temporal (01/22 1718) BP: 136/78 (01/22 1818) Pulse Rate: 65 (01/22 1818)  Labs: Recent Labs    05/13/24 0908 05/13/24 0956 05/13/24 1839  HGB 15.5  --   --   HCT 46.4  --   --   PLT 187  --   --   APTT  --  27  --   LABPROT 13.5  --   --   INR 1.0  --   --   HEPARINUNFRC  --   --  >1.10*  CREATININE 1.11  --   --     Estimated Creatinine Clearance: 61.8 mL/min (by C-G formula based on SCr of 1.11 mg/dL).   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 90 capsule 1 05/13/2024   buPROPion  (WELLBUTRIN  XL) 300 MG 24 hr tablet Take 300 mg by mouth daily.    05/13/2024   busPIRone  (BUSPAR ) 30 MG tablet Take 30 mg by mouth 2 (two) times daily.    05/13/2024   cyclobenzaprine  (FLEXERIL ) 5 MG tablet Take 1 tablet (5 mg total) by mouth 3 (three) times daily as needed for muscle spasms. 90 tablet 1 Unknown   methylphenidate  (RITALIN ) 20 MG  tablet Take 10-20 mg by mouth 3 (three) times daily with meals.   05/13/2024   pantoprazole  (PROTONIX ) 20 MG tablet Take 20 mg by mouth daily.   05/13/2024   atorvastatin  (LIPITOR) 40 MG tablet Take 1 tablet (40 mg total) by mouth daily. TAKE 1 TABLET BY MOUTH EVERY DAY 30 tablet 11     Assessment: 64 y/o M with a known h/o LE ischemia and stenting presenting to the ED with leg pain and numbness.  Patient has been off Plavix  x 1 mo.  Goal of Therapy:  Heparin  level 0.3-0.7 units/ml Monitor platelets by anticoagulation protocol: Yes   Date Time HL Rate/Comment  1/22 1839 >1.10 Supratherapeutic @ 1250 units/hr  Plan:  HL is supratherpaeutic Will hold infusion for 1 hour and decrease heparin  infusion to 1,050 units/hr Check anti-Xa level in 6 hours and daily while on heparin  Continue to monitor H&H and platelets  Annabella LOISE Banks, PharmD Clinical Pharmacist 05/13/2024 7:06 PM        [1]  Allergies Allergen Reactions   Simvastatin     Other Reaction(s): myalgia arthralgia   Nsaids     Other Reaction(s): gi bleed

## 2024-05-13 NOTE — H&P (View-Only) (Signed)
 " [facility] Vascular Consult Note  MRN : [id number]  Alan Trevino is a 64 y.o. (06/20/1960) male who presents with chief complaint of  Chief Complaint  Patient presents with   Leg Pain   Numbness  .   Consulting Physician:Zhang Cort, MD Reason for consult: Critical limb ischemia History of Present Illness: Alan Trevino is a 64 year old male known to our practice.  He has a previous medical history of peripheral arterial disease with intervention and right sided SFA stenting in November 2024, hypertension, lipidemia, GERD and history of GI bleed.  He had a recent follow-up with us  in October 2025 at which point his ABIs were considered normal.  He notes that about a month or so ago he ran out of Plavix  and was unable to refill.  He notes that on Tuesday he began to have significant claudication-like symptoms while working.  Claudication was disabling and he also had subsequent rest pain symptoms.  He noted that the leg was cooler than previous as well.  His pain has been occurring from his mid thigh down to his lower extremity.  He has also been complaining of intermittent numbness with activity.  Current Facility-Administered Medications  Medication Dose Route Frequency Provider Last Rate Last Admin   acetaminophen  (TYLENOL ) tablet 650 mg  650 mg Oral Q6H PRN Laurita Cort T, MD       Or   acetaminophen  (TYLENOL ) suppository 650 mg  650 mg Rectal Q6H PRN Laurita Cort DASEN, MD       [START ON 05/14/2024] amLODipine  (NORVASC ) tablet 10 mg  10 mg Oral Daily Zhang, Ping T, MD       And   [START ON 05/14/2024] benazepril  (LOTENSIN ) tablet 20 mg  20 mg Oral Daily Laurita Cort DASEN, MD       [START ON 05/14/2024] atorvastatin  (LIPITOR) tablet 40 mg  40 mg Oral Daily Laurita Cort DASEN, MD       bisacodyl  (DULCOLAX) EC tablet 5 mg  5 mg Oral Daily PRN Laurita Cort DASEN, MD       [START ON 05/14/2024] buPROPion  (WELLBUTRIN  XL) 24 hr tablet 300 mg  300 mg Oral Daily Laurita Cort T, MD        busPIRone  (BUSPAR ) tablet 30 mg  30 mg Oral BID Laurita Cort T, MD       cyclobenzaprine  (FLEXERIL ) tablet 5 mg  5 mg Oral TID PRN Laurita Cort DASEN, MD       heparin  ADULT infusion 100 units/mL (25000 units/250mL)  1,250 Units/hr Intravenous Continuous Laurita Cort T, MD 12.5 mL/hr at 05/13/24 1039 1,250 Units/hr at 05/13/24 1039   HYDROcodone -acetaminophen  (NORCO/VICODIN) 5-325 MG per tablet 1-2 tablet  1-2 tablet Oral Q4H PRN Laurita Cort DASEN, MD   1 tablet at 05/13/24 1005   HYDROmorphone  (DILAUDID ) injection 0.5-1 mg  0.5-1 mg Intravenous Q2H PRN Laurita Cort T, MD       methylphenidate  (RITALIN ) tablet 10 mg  10 mg Oral q1600 Greenwood, Howard F, [facility]       methylphenidate  (RITALIN ) tablet 20 mg  20 mg Oral BID WC Greenwood, Howard F, RPH   20 mg at 05/13/24 1212   ondansetron  (ZOFRAN ) tablet 4 mg  4 mg Oral Q6H PRN Laurita Cort DASEN, MD       Or   ondansetron  (ZOFRAN ) injection 4 mg  4 mg Intravenous Q6H PRN Laurita Cort T, MD       traZODone  (DESYREL ) tablet 25 mg  25 mg  Oral QHS PRN Laurita Cort DASEN, MD       Current Outpatient Medications  Medication Sig Dispense Refill   amLODipine -benazepril  (LOTREL) 10-20 MG capsule Take 1 capsule by mouth daily. 90 capsule 1   buPROPion  (WELLBUTRIN  XL) 300 MG 24 hr tablet Take 300 mg by mouth daily.      busPIRone  (BUSPAR ) 30 MG tablet Take 30 mg by mouth 2 (two) times daily.      cyclobenzaprine  (FLEXERIL ) 5 MG tablet Take 1 tablet (5 mg total) by mouth 3 (three) times daily as needed for muscle spasms. 90 tablet 1   methylphenidate  (RITALIN ) 20 MG tablet Take 10-20 mg by mouth 3 (three) times daily with meals.     pantoprazole  (PROTONIX ) 20 MG tablet Take 20 mg by mouth daily.     atorvastatin  (LIPITOR) 40 MG tablet Take 1 tablet (40 mg total) by mouth daily. 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   Stricture and stenosis of esophagus    Upper GI bleed 11/28/2016   Wears dentures    full upper    Past Surgical History:  Procedure Laterality Date   COLONOSCOPY WITH PROPOFOL  N/A 12/29/2015   Procedure: COLONOSCOPY WITH PROPOFOL ;  Surgeon: Rogelia Copping, MD;  Location: [facility];  Service: Endoscopy;  Laterality: N/A;   COLONOSCOPY WITH PROPOFOL  N/A 11/29/2019   Procedure: COLONOSCOPY WITH PROPOFOL ;  Surgeon: Copping Rogelia, MD;  Location: [facility];  Service: Endoscopy;  Laterality: N/A;  priority 4   ESOPHAGEAL DILATION     stretching   ESOPHAGEAL DILATION  01/17/2017   Procedure: ESOPHAGEAL DILATION;  Surgeon: Copping Rogelia, MD;  Location: [facility];  Service: Gastroenterology;;   ESOPHAGOGASTRODUODENOSCOPY (EGD) WITH PROPOFOL  N/A 11/29/2016   Procedure: ESOPHAGOGASTRODUODENOSCOPY (EGD) WITH PROPOFOL ;  Surgeon: Copping Rogelia, MD;  Location: [facility];  Service: Endoscopy;  Laterality: N/A;   ESOPHAGOGASTRODUODENOSCOPY (EGD) WITH PROPOFOL  N/A 01/17/2017   Procedure: ESOPHAGOGASTRODUODENOSCOPY (EGD) WITH PROPOFOL ;  Surgeon: Copping Rogelia, MD;  Location: [facility];  Service: Gastroenterology;  Laterality: N/A;   HERNIA REPAIR Right    INGUINAL HERNIA REPAIR Left 01/14/2020   Procedure: HERNIA REPAIR INGUINAL ADULT;  Surgeon: Dessa Reyes ORN, MD;  Location: [facility];  Service: General;  Laterality: Left;   LOWER EXTREMITY ANGIOGRAPHY Right 02/28/2023   Procedure: Lower Extremity Angiography;  Surgeon: Jama Cordella MATSU, MD;  Location: [facility];  Service: Cardiovascular;  Laterality: Right;    POLYPECTOMY N/A 12/29/2015   Procedure: POLYPECTOMY;  Surgeon: Rogelia Copping, MD;  Location: [facility];  Service: Endoscopy;  Laterality: N/A;   POLYPECTOMY  01/17/2017   Procedure: POLYPECTOMY;  Surgeon: Copping Rogelia, MD;  Location: [facility];  Service: Gastroenterology;;   POLYPECTOMY N/A 11/29/2019   Procedure: POLYPECTOMY;  Surgeon: Copping Rogelia, MD;  Location: [facility];  Service: Endoscopy;  Laterality: N/A;   SHOULDER SURGERY Right 09/20/2009   TOTAL HIP ARTHROPLASTY Right  12/21/2020   Procedure: TOTAL HIP ARTHROPLASTY ANTERIOR APPROACH;  Surgeon: Kathlynn Sharper, MD;  Location: [facility];  Service: Orthopedics;  Laterality: Right;    Social History Social History[1]  Family History Family History  Problem Relation Age of Onset   Ovarian cancer Mother    CAD Father     Allergies[2]   REVIEW OF SYSTEMS (Negative unless checked)  Constitutional: [] Weight loss  [] Fever  [] Chills Cardiac: [] Chest pain   [] Chest pressure   [] Palpitations   [] Shortness of breath when laying flat   [] Shortness of breath at rest   [] Shortness of breath with exertion. Vascular:  [] Pain in legs with walking   [] Pain in legs at rest   [] Pain in legs when laying flat   [] Claudication   [] Pain in feet when walking  [] Pain in feet at rest  [] Pain in feet when laying flat   [] History of DVT   [] Phlebitis   [x] Swelling in legs   [] Varicose veins   [] Non-healing ulcers Pulmonary:   [] Uses home oxygen   [] Productive cough   [] Hemoptysis   [] Wheeze  [] COPD   [] Asthma Neurologic:  [] Dizziness  [] Blackouts   [] Seizures   [] History of stroke   [] History of TIA  [] Aphasia   [] Temporary blindness   [] Dysphagia   [] Weakness or numbness in arms   [] Weakness or numbness in legs Musculoskeletal:  [] Arthritis   [] Joint swelling   [] Joint pain   [] Low back pain Hematologic:  [] Easy bruising  [] Easy bleeding   [] Hypercoagulable state   [] Anemic  [] Hepatitis Gastrointestinal:  [] Blood in stool    [] Vomiting blood  [] Gastroesophageal reflux/heartburn   [] Difficulty swallowing. Genitourinary:  [] Chronic kidney disease   [] Difficult urination  [] Frequent urination  [] Burning with urination   [] Blood in urine Skin:  [] Rashes   [] Ulcers   [] Wounds Psychological:  [x] History of anxiety   []  History of major depression.  Physical Examination  Vitals:   05/13/24 1000 05/13/24 1050 05/13/24 1300 05/13/24 1303  BP: 130/68  137/78   Pulse: 62  68   Resp:   17   Temp:    98 F (36.7 C)  TempSrc:    Oral  SpO2: 98% 97% 98%   Weight:      Height:       Body mass index is 30.11 kg/m. Gen:  WD/WN, NAD Head: [place]/AT, No temporalis wasting. Prominent temp pulse not noted. Ear/Nose/Throat: Hearing grossly intact, nares w/o erythema or drainage, oropharynx w/o Erythema/Exudate Eyes: Sclera non-icteric, conjunctiva clear Neck: Trachea midline.  No JVD.  Pulmonary:  Good air movement, respirations not labored, equal bilaterally.  Cardiac: RRR, normal S1, S2. Vascular: Cooler right foot Vessel Right Left  Radial Palpable Palpable  PT Not Palpable Trace Palpable  DP Not Palpable Palpable   Gastrointestinal: soft, non-tender/non-distended. No guarding/reflex.  Musculoskeletal: M/S 5/5 throughout.  Extremities without ischemic changes.  No deformity or atrophy.  1+ edema left lower extremity Neurologic: Sensation grossly intact in extremities.  Symmetrical.  Speech is fluent. Motor exam as listed above. Psychiatric: Judgment intact, Mood & affect appropriate for pt's clinical situation. Dermatologic: No rashes or ulcers noted.  No cellulitis or open wounds. Lymph : No Cervical, Axillary, or Inguinal lymphadenopathy.    CBC Lab Results  Component Value Date   WBC 9.6 05/13/2024   HGB 15.5 05/13/2024   HCT 46.4 05/13/2024   MCV 94.9 05/13/2024   PLT 187 05/13/2024    BMET    Component Value Date/Time   NA 141 05/13/2024  0908   NA 142 07/07/2023 0902   NA 139 04/16/2013 1607   K  3.8 05/13/2024 0908   K 4.0 04/16/2013 1607   CL 106 05/13/2024 0908   CL 97 (L) 04/16/2013 1607   CO2 23 05/13/2024 0908   CO2 31 04/16/2013 1607   GLUCOSE 144 (H) 05/13/2024 0908   GLUCOSE 106 (H) 04/16/2013 1607   BUN 9 05/13/2024 0908   BUN 11 07/07/2023 0902   BUN 10 04/16/2013 1607   CREATININE 1.11 05/13/2024 0908   CREATININE 1.12 04/16/2013 1607   CALCIUM  9.3 05/13/2024 0908   CALCIUM  9.6 04/16/2013 1607   GFRNONAA >60 05/13/2024 0908   GFRNONAA >60 04/16/2013 1607   GFRAA 69 03/09/2020 1423   GFRAA >60 04/16/2013 1607   Estimated Creatinine Clearance: 61.8 mL/min (by C-G formula based on SCr of 1.11 mg/dL).  COAG Lab Results  Component Value Date   INR 1.0 05/13/2024   INR 1.1 02/28/2023    Radiology No results found.    Assessment/Plan 1.  Critical limb ischemia  Based upon the patient's symptoms and physical exam, it appears that he has had an acute arterial occlusion.  To treat this we will plan on having the patient undergo angiogram.  I discussed the risk, benefits alternatives with the patient he is agreeable to proceed.  I also discussed with the patient that he may require placement of a lysis catheter as this is sometimes necessary with acute events.  2.  Hyperlipidemia Will continue with statin  3.  Hypertension Currently controlled with amlodipine  and ARB  Plan of care discussed with Dr. Marea and he is in agreement with plan noted above.   Family Communication: Wife at bedside   I spent 75 minutes in this encounter including personally reviewing extensive medical records, personally reviewing imaging studies and compared to prior scans, counseling the patient, placing orders, coordinating care and performing appropriate documentation  Thank you for allowing us  to participate in the care of this patient.   Nile Dorning E Daryle Amis, NP [facility] [phone] (Office Phone) [phone] (Office Fax) [phone]  (Pager)  05/13/2024 2:42 PM  Staff may message me via secure chat in Epic  but this may not receive immediate response,  please page for urgent matters!  Dictation software was used to generate the above note. Typos may occur and escape review, as with typed/written notes. Any error is purely unintentional.  Please contact me directly for clarity if needed.      [1]  Social History Tobacco Use   Smoking status: Former    Current packs/day: 0.00    Average packs/day: 2.0 packs/day for 30.0 years (60.0 ttl pk-yrs)    Types: Cigarettes    Start date: 77    Quit date: 2000    Years since quitting: 26.0   Smokeless tobacco: Current    Types: Chew  Vaping Use   Vaping status: Never Used  Substance Use Topics   Alcohol use: Yes    Alcohol/week: 12.0 standard drinks of alcohol    Types: 12 Cans of beer per week    Comment: BEER once a week   Drug use: No  [2]  Allergies Allergen Reactions   Simvastatin     Other Reaction(s): myalgia arthralgia   Nsaids     Other Reaction(s): gi bleed   "

## 2024-05-13 NOTE — Consult Note (Signed)
 " [facility] Vascular Consult Note  MRN : [id number]  Alan Trevino is a 64 y.o. (11-Jul-1960) male who presents with chief complaint of  Chief Complaint  Patient presents with   Leg Pain   Numbness  .   Consulting Physician:Zhang Cort, MD Reason for consult: Critical limb ischemia History of Present Illness: Alan Trevino is a 64 year old male known to our practice.  He has a previous medical history of peripheral arterial disease with intervention and right sided SFA stenting in November 2024, hypertension, lipidemia, GERD and history of GI bleed.  He had a recent follow-up with us  in October 2025 at which point his ABIs were considered normal.  He notes that about a month or so ago he ran out of Plavix  and was unable to refill.  He notes that on Tuesday he began to have significant claudication-like symptoms while working.  Claudication was disabling and he also had subsequent rest pain symptoms.  He noted that the leg was cooler than previous as well.  His pain has been occurring from his mid thigh down to his lower extremity.  He has also been complaining of intermittent numbness with activity.  Current Facility-Administered Medications  Medication Dose Route Frequency Provider Last Rate Last Admin   acetaminophen  (TYLENOL ) tablet 650 mg  650 mg Oral Q6H PRN Laurita Cort DASEN, MD       Or   acetaminophen  (TYLENOL ) suppository 650 mg  650 mg Rectal Q6H PRN Laurita Cort DASEN, MD       [START ON 05/14/2024] amLODipine  (NORVASC ) tablet 10 mg  10 mg Oral Daily Zhang, Ping T, MD       And   [START ON 05/14/2024] benazepril  (LOTENSIN ) tablet 20 mg  20 mg Oral Daily Laurita Cort DASEN, MD       [START ON 05/14/2024] atorvastatin  (LIPITOR) tablet 40 mg  40 mg Oral Daily Zhang, Cort T, MD       bisacodyl  (DULCOLAX) EC tablet 5 mg  5 mg Oral Daily PRN Laurita Cort DASEN, MD       [START ON 05/14/2024] buPROPion  (WELLBUTRIN  XL) 24 hr tablet 300 mg  300 mg Oral Daily Laurita Cort T, MD        busPIRone  (BUSPAR ) tablet 30 mg  30 mg Oral BID Laurita Cort T, MD       cyclobenzaprine  (FLEXERIL ) tablet 5 mg  5 mg Oral TID PRN Laurita Cort DASEN, MD       heparin  ADULT infusion 100 units/mL (25000 units/250mL)  1,250 Units/hr Intravenous Continuous Laurita Cort T, MD 12.5 mL/hr at 05/13/24 1039 1,250 Units/hr at 05/13/24 1039   HYDROcodone -acetaminophen  (NORCO/VICODIN) 5-325 MG per tablet 1-2 tablet  1-2 tablet Oral Q4H PRN Laurita Cort T, MD   1 tablet at 05/13/24 1005   HYDROmorphone  (DILAUDID ) injection 0.5-1 mg  0.5-1 mg Intravenous Q2H PRN Laurita Cort T, MD       methylphenidate  (RITALIN ) tablet 10 mg  10 mg Oral q1600 Greenwood, Howard F, [facility]       methylphenidate  (RITALIN ) tablet 20 mg  20 mg Oral BID WC Greenwood, Howard F, RPH   20 mg at 05/13/24 1212   ondansetron  (ZOFRAN ) tablet 4 mg  4 mg Oral Q6H PRN Laurita Cort DASEN, MD       Or   ondansetron  (ZOFRAN ) injection 4 mg  4 mg Intravenous Q6H PRN Laurita Cort T, MD       traZODone  (DESYREL ) tablet 25 mg  25 mg  Oral QHS PRN Laurita Cort DASEN, MD       Current Outpatient Medications  Medication Sig Dispense Refill   amLODipine -benazepril  (LOTREL) 10-20 MG capsule Take 1 capsule by mouth daily. 90 capsule 1   buPROPion  (WELLBUTRIN  XL) 300 MG 24 hr tablet Take 300 mg by mouth daily.      busPIRone  (BUSPAR ) 30 MG tablet Take 30 mg by mouth 2 (two) times daily.      cyclobenzaprine  (FLEXERIL ) 5 MG tablet Take 1 tablet (5 mg total) by mouth 3 (three) times daily as needed for muscle spasms. 90 tablet 1   methylphenidate  (RITALIN ) 20 MG tablet Take 10-20 mg by mouth 3 (three) times daily with meals.     pantoprazole  (PROTONIX ) 20 MG tablet Take 20 mg by mouth daily.     atorvastatin  (LIPITOR) 40 MG tablet Take 1 tablet (40 mg total) by mouth daily. 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   Stricture and stenosis of esophagus    Upper GI bleed 11/28/2016   Wears dentures    full upper    Past Surgical History:  Procedure Laterality Date   COLONOSCOPY WITH PROPOFOL  N/A 12/29/2015   Procedure: COLONOSCOPY WITH PROPOFOL ;  Surgeon: Rogelia Copping, MD;  Location: [facility];  Service: Endoscopy;  Laterality: N/A;   COLONOSCOPY WITH PROPOFOL  N/A 11/29/2019   Procedure: COLONOSCOPY WITH PROPOFOL ;  Surgeon: Copping Rogelia, MD;  Location: [facility] SUR[facility]ERY [facility];  Service: Endoscopy;  Laterality: N/A;  priority 4   ESOPHAGEAL DILATION     stretching   ESOPHAGEAL DILATION  01/17/2017   Procedure: ESOPHAGEAL DILATION;  Surgeon: Copping Rogelia, MD;  Location: [facility] SURGERY CNTR;  Service: Gastroenterology;;   ESOPHAGOGASTRODUODENOSCOPY (EGD) WITH PROPOFOL  N/A 11/29/2016   Procedure: ESOPHAGOGASTRODUODENOSCOPY (EGD) WITH PROPOFOL ;  Surgeon: Copping Rogelia, MD;  Location: [facility] ENDOSCOPY;  Service: Endoscopy;  Laterality: N/A;   ESOPHAGOGASTRODUODENOSCOPY (EGD) WITH PROPOFOL  N/A 01/17/2017   Procedure: ESOPHAGOGASTRODUODENOSCOPY (EGD) WITH PROPOFOL ;  Surgeon: Copping Rogelia, MD;  Location: [facility] SURGERY CNTR;  Service: Gastroenterology;  Laterality: N/A;   HERNIA REPAIR Right    INGUINAL HERNIA REPAIR Left 01/14/2020   Procedure: HERNIA REPAIR INGUINAL ADULT;  Surgeon: Dessa Reyes ORN, MD;  Location: [facility] ORS;  Service: General;  Laterality: Left;   LOWER EXTREMITY ANGIOGRAPHY Right 02/28/2023   Procedure: Lower Extremity Angiography;  Surgeon: Jama Cordella MATSU, MD;  Location: [facility] INVASIVE CV LAB;  Service: Cardiovascular;  Laterality: Right;    POLYPECTOMY N/A 12/29/2015   Procedure: POLYPECTOMY;  Surgeon: Rogelia Copping, MD;  Location: [facility] SURGERY CNTR;  Service: Endoscopy;  Laterality: N/A;   POLYPECTOMY  01/17/2017   Procedure: POLYPECTOMY;  Surgeon: Copping Rogelia, MD;  Location: [facility] SURGERY CNTR;  Service: Gastroenterology;;   POLYPECTOMY N/A 11/29/2019   Procedure: POLYPECTOMY;  Surgeon: Copping Rogelia, MD;  Location: [facility];  Service: Endoscopy;  Laterality: N/A;   SHOULDER SURGERY Right 09/20/2009   TOTAL HIP ARTHROPLASTY Right  12/21/2020   Procedure: TOTAL HIP ARTHROPLASTY ANTERIOR APPROACH;  Surgeon: Kathlynn Sharper, MD;  Location: [facility] ORS;  Service: Orthopedics;  Laterality: Right;    Social History Social History[1]  Family History Family History  Problem Relation Age of Onset   Ovarian cancer Mother    CAD Father     Allergies[2]   REVIEW OF SYSTEMS (Negative unless checked)  Constitutional: [] Weight loss  [] Fever  [] Chills Cardiac: [] Chest pain   [] Chest pressure   [] Palpitations   [] Shortness of breath when laying flat   [] Shortness of breath at rest   [] Shortness of breath with exertion. Vascular:  [] Pain in legs with walking   [] Pain in legs at rest   [] Pain in legs when laying flat   [] Claudication   [] Pain in feet when walking  [] Pain in feet at rest  [] Pain in feet when laying flat   [] History of DVT   [] Phlebitis   [x] Swelling in legs   [] Varicose veins   [] Non-healing ulcers Pulmonary:   [] Uses home oxygen   [] Productive cough   [] Hemoptysis   [] Wheeze  [] COPD   [] Asthma Neurologic:  [] Dizziness  [] Blackouts   [] Seizures   [] History of stroke   [] History of TIA  [] Aphasia   [] Temporary blindness   [] Dysphagia   [] Weakness or numbness in arms   [] Weakness or numbness in legs Musculoskeletal:  [] Arthritis   [] Joint swelling   [] Joint pain   [] Low back pain Hematologic:  [] Easy bruising  [] Easy bleeding   [] Hypercoagulable state   [] Anemic  [] Hepatitis Gastrointestinal:  [] Blood in stool    [] Vomiting blood  [] Gastroesophageal reflux/heartburn   [] Difficulty swallowing. Genitourinary:  [] Chronic kidney disease   [] Difficult urination  [] Frequent urination  [] Burning with urination   [] Blood in urine Skin:  [] Rashes   [] Ulcers   [] Wounds Psychological:  [x] History of anxiety   []  History of major depression.  Physical Examination  Vitals:   05/13/24 1000 05/13/24 1050 05/13/24 1300 05/13/24 1303  BP: 130/68  137/78   Pulse: 62  68   Resp:   17   Temp:    98 F (36.7 C)  TempSrc:    Oral  SpO2: 98% 97% 98%   Weight:      Height:       Body mass index is 30.11 kg/m. Gen:  WD/WN, NAD Head: [place]/AT, No temporalis wasting. Prominent temp pulse not noted. Ear/Nose/Throat: Hearing grossly intact, nares w/o erythema or drainage, oropharynx w/o Erythema/Exudate Eyes: Sclera non-icteric, conjunctiva clear Neck: Trachea midline.  No JVD.  Pulmonary:  Good air movement, respirations not labored, equal bilaterally.  Cardiac: RRR, normal S1, S2. Vascular: Cooler right foot Vessel Right Left  Radial Palpable Palpable  PT Not Palpable Trace Palpable  DP Not Palpable Palpable   Gastrointestinal: soft, non-tender/non-distended. No guarding/reflex.  Musculoskeletal: M/S 5/5 throughout.  Extremities without ischemic changes.  No deformity or atrophy.  1+ edema left lower extremity Neurologic: Sensation grossly intact in extremities.  Symmetrical.  Speech is fluent. Motor exam as listed above. Psychiatric: Judgment intact, Mood & affect appropriate for pt's clinical situation. Dermatologic: No rashes or ulcers noted.  No cellulitis or open wounds. Lymph : No Cervical, Axillary, or Inguinal lymphadenopathy.    CBC Lab Results  Component Value Date   WBC 9.6 05/13/2024   HGB 15.5 05/13/2024   HCT 46.4 05/13/2024   MCV 94.9 05/13/2024   PLT 187 05/13/2024    BMET    Component Value Date/Time   NA 141 05/13/2024  0908   NA 142 07/07/2023 0902   NA 139 04/16/2013 1607   K  3.8 05/13/2024 0908   K 4.0 04/16/2013 1607   CL 106 05/13/2024 0908   CL 97 (L) 04/16/2013 1607   CO2 23 05/13/2024 0908   CO2 31 04/16/2013 1607   GLUCOSE 144 (H) 05/13/2024 0908   GLUCOSE 106 (H) 04/16/2013 1607   BUN 9 05/13/2024 0908   BUN 11 07/07/2023 0902   BUN 10 04/16/2013 1607   CREATININE 1.11 05/13/2024 0908   CREATININE 1.12 04/16/2013 1607   CALCIUM  9.3 05/13/2024 0908   CALCIUM  9.6 04/16/2013 1607   GFRNONAA >60 05/13/2024 0908   GFRNONAA >60 04/16/2013 1607   GFRAA 69 03/09/2020 1423   GFRAA >60 04/16/2013 1607   Estimated Creatinine Clearance: 61.8 mL/min (by C-G formula based on SCr of 1.11 mg/dL).  COAG Lab Results  Component Value Date   INR 1.0 05/13/2024   INR 1.1 02/28/2023    Radiology No results found.    Assessment/Plan 1.  Critical limb ischemia  Based upon the patient's symptoms and physical exam, it appears that he has had an acute arterial occlusion.  To treat this we will plan on having the patient undergo angiogram.  I discussed the risk, benefits alternatives with the patient he is agreeable to proceed.  I also discussed with the patient that he may require placement of a lysis catheter as this is sometimes necessary with acute events.  2.  Hyperlipidemia Will continue with statin  3.  Hypertension Currently controlled with amlodipine  and ARB  Plan of care discussed with Dr. Marea and he is in agreement with plan noted above.   Family Communication: Wife at bedside   I spent 75 minutes in this encounter including personally reviewing extensive medical records, personally reviewing imaging studies and compared to prior scans, counseling the patient, placing orders, coordinating care and performing appropriate documentation  Thank you for allowing us  to participate in the care of this patient.   Kelden Lavallee E Haide Klinker, NP [facility] [phone] (Office Phone) [phone] (Office Fax) [phone]  (Pager)  05/13/2024 2:42 PM  Staff may message me via secure chat in Epic  but this may not receive immediate response,  please page for urgent matters!  Dictation software was used to generate the above note. Typos may occur and escape review, as with typed/written notes. Any error is purely unintentional.  Please contact me directly for clarity if needed.      [1]  Social History Tobacco Use   Smoking status: Former    Current packs/day: 0.00    Average packs/day: 2.0 packs/day for 30.0 years (60.0 ttl pk-yrs)    Types: Cigarettes    Start date: 40    Quit date: 2000    Years since quitting: 26.0   Smokeless tobacco: Current    Types: Chew  Vaping Use   Vaping status: Never Used  Substance Use Topics   Alcohol use: Yes    Alcohol/week: 12.0 standard drinks of alcohol    Types: 12 Cans of beer per week    Comment: BEER once a week   Drug use: No  [2]  Allergies Allergen Reactions   Simvastatin     Other Reaction(s): myalgia arthralgia   Nsaids     Other Reaction(s): gi bleed   "

## 2024-05-13 NOTE — H&P (Signed)
 " History and Physical    Alan Trevino FMW:[id number] DOB: [birth year] DOA: 05/13/2024  PCP: Manya Toribio SQUIBB, PA (Confirm with patient/family/NH records and if not entered, this has to be entered at [facility] point of entry) Patient coming from: Home  I have personally briefly reviewed patient's old medical records in [facility]  Chief Complaint: Right leg and right foot pain  HPI: Alan Trevino is a 64 y.o. male with medical history significant of PVD ischemic limb status post right-sided SFA stenting November 2024, HTN, HLD, GERD, history of GI bleed, anxiety/depression, presented with worsening of right leg and right foot pain.  Patient used to be on Plavix  after a stent placed in in right SFA in November 2024.  1 months ago he ran out of Plavix  but forgot to refill.  This time, symptoms started about 1 week ago he started to have claudication with dull-like pain fortunately on the right coronary artery where radiating to the back of the knee, last few days he started develop right calf pain and last night he developed resting pain of right foot which also felt cold.  Denied any chest pain shortness of breath.  ED Course: Temperature 98 no tachycardia blood pressure 140/70 O2 saturation 97% room air.  Diminished pedal pulses on right side.  Patient started on heparin  drip in the ED.  CBC BMP is pending.  Vascular surgeon consulted.  Review of Systems: As per HPI otherwise 14 point review of systems negative.    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/06/2023   Critical limb ischemia of right lower extremity (HCC) 02/28/2023   Depression    Esophagogastric ulcer    GERD (gastroesophageal reflux disease)    H/O ulcer disease    Headache    MIGRAINES   Hypertension    Myoclonus 08/23/2008   Rectal polyp    SBO (small bowel obstruction) (HCC)    Status post total hip replacement, right  12/21/2020   Stricture and stenosis of esophagus    Upper GI bleed 11/28/2016   Wears dentures    full upper    Past Surgical History:  Procedure Laterality Date   COLONOSCOPY WITH PROPOFOL  N/A 12/29/2015   Procedure: COLONOSCOPY WITH PROPOFOL ;  Surgeon: Rogelia Copping, MD;  Location: [facility] SURGERY CNTR;  Service: Endoscopy;  Laterality: N/A;   COLONOSCOPY WITH PROPOFOL  N/A 11/29/2019   Procedure: COLONOSCOPY WITH PROPOFOL ;  Surgeon: Copping Rogelia, MD;  Location: [facility];  Service: Endoscopy;  Laterality: N/A;  priority 4   ESOPHAGEAL DILATION     stretching   ESOPHAGEAL DILATION  01/17/2017   Procedure: ESOPHAGEAL DILATION;  Surgeon: Copping Rogelia, MD;  Location: [facility] SURGERY CN[facility];  Service: Gastroenterology;;   ESOPHAGOGASTRODUODENOSCOPY (EGD) WITH PROPOFOL  N/A 11/29/2016   Procedure: ESOPHAGOGASTRODUODENOSCOPY (EGD) WITH PROPOFOL ;  Surgeon: Copping Rogelia, MD;  Location: [facility] ENDOSCOPY;  Service: Endoscopy;  Laterality: N/A;   ESOPHAGOGASTRODUODENOSCOPY (EGD) WITH PROPOFOL  N/A 01/17/2017   Procedure: ESOPHAGOGASTRODUODENOSCOPY (EGD) WITH PROPOFOL ;  Surgeon: Copping Rogelia, MD;  Location: [facility] SURGERY CNTR;  Service: Gastroenterology;  Laterality: N/A;   HERNIA REPAIR Right    INGUINAL HERNIA REPAIR Left 01/14/2020   Procedure: HERNIA REPAIR INGUINAL ADULT;  Surgeon: Dessa Reyes ORN, MD;  Location: [facility] ORS;  Service: General;  Laterality: Left;   LOWER EXTREMITY ANGIOGRAPHY Right 02/28/2023   Procedure: Lower Extremity Angiography;  Surgeon: Jama Cordella MATSU, MD;  Location: [facility] INVASIVE  CV LAB;  Service: Cardiovascular;  Laterality: Right;   POLYPECTOMY N/A 12/29/2015   Procedure: POLYPECTOMY;  Surgeon: Rogelia Copping, MD;  Location: [facility] SURGERY CNTR;  Service: Endoscopy;  Laterality: N/A;   POLYPECTOMY  01/17/2017   Procedure: POLYPECTOMY;  Surgeon: Copping Rogelia, MD;  Location: [facility] SURGERY CNTR;  Service: Gastroenterology;;   POLYPECTOMY N/A 11/29/2019   Procedure:  POLYPECTOMY;  Surgeon: Copping Rogelia, MD;  Location: [facility] SURGERY [facility];  Service: Endoscopy;  Laterality: N/A;   SHOULDER SURGERY Right 09/20/2009   TOTAL HIP ARTHROPLASTY Right 12/21/2020   Procedure: TOTAL HIP ARTHROPLASTY ANTERIOR APPROACH;  Surgeon: Kathlynn Sharper, MD;  Location: [facility] ORS;  Service: Orthopedics;  Laterality: Right;     reports that he quit smoking about 26 years ago. His smoking use included cigarettes. He started smoking about 56 years ago. He has a 60 pack-year smoking history. His smokeless tobacco use includes chew. He reports current alcohol use of about 12.0 standard drinks of alcohol per week. He reports that he does not use drugs.  Allergies[1]  Family History  Problem Relation Age of Onset   Ovarian cancer Mother    CAD Father    Prior to Admission medications  Medication Sig Start Date End Date Taking? Authorizing Provider  amLODipine -benazepril  (LOTREL) 10-20 MG capsule Take 1 capsule by mouth daily. 12/19/23   Manya Toribio SQUIBB, PA  atorvastatin  (LIPITOR) 40 MG tablet Take 1 tablet (40 mg total) by mouth daily. TAKE 1 TABLET BY MOUTH EVERY DAY 03/03/23 03/02/24  Von Bellis, MD  buPROPion  (WELLBUTRIN  XL) 300 MG 24 hr tablet Take 300 mg by mouth daily.  10/11/13   [provider]  busPIRone  (BUSPAR ) 30 MG tablet Take 30 mg by mouth 2 (two) times daily.     [provider]  cyclobenzaprine  (FLEXERIL ) 5 MG tablet Take 1 tablet (5 mg total) by mouth 3 (three) times daily as needed for muscle spasms. 03/24/24   Manya Toribio SQUIBB, PA  methylphenidate  (RITALIN ) 20 MG tablet Take 10-20 mg by mouth 3 (three) times daily with meals.    [provider]    Physical Exam: Vitals:   05/13/24 0843 05/13/24 0844  BP: (!) 149/76   Pulse: 82   Resp: 18   Temp: 98 F (36.7 C)   SpO2: 97%   Weight:  77.1 kg  Height:  5' 3 (1.6 m)    Constitutional: NAD, calm, comfortable Vitals:   05/13/24 0843 05/13/24 0844  BP: (!) 149/76   Pulse: 82    Resp: 18   Temp: 98 F (36.7 C)   SpO2: 97%   Weight:  77.1 kg  Height:  5' 3 (1.6 m)   Eyes: PERRL, lids and conjunctivae normal ENMT: Mucous membranes are moist. Posterior pharynx clear of any exudate or lesions.Normal dentition.  Neck: normal, supple, no masses, no thyromegaly Respiratory: clear to auscultation bilaterally, no wheezing, no crackles. Normal respiratory effort. No accessory muscle use.  Cardiovascular: Regular rate and rhythm, no murmurs / rubs / gallops. No extremity edema.  Diminished pedal pulses on the right side, left foot cool to touch.  Decreased distal capillary refill on right foot.  No carotid bruits.  Abdomen: no tenderness, no masses palpated. No hepatosplenomegaly. Bowel sounds positive.  Musculoskeletal: no clubbing / cyanosis. No joint deformity upper and lower extremities. Good ROM, no contractures. Normal muscle tone.  Skin: no rashes, lesions, ulcers. No induration Neurologic: CN 2-12 grossly intact. Sensation intact, DTR normal. Strength 5/5 in all 4.  Psychiatric:  Normal judgment and insight. Alert and oriented x 3. Normal mood.    Labs on Admission: I have personally reviewed following labs and imaging studies  CBC: Recent Labs  Lab 05/13/24 0908  WBC 9.6  NEUTROABS 7.5  HGB 15.5  HCT 46.4  MCV 94.9  PLT 187   Basic Metabolic Panel: No results for input(s): NA, K, CL, CO2, GLUCOSE, BUN, CREATININE, CALCIUM , MG, PHOS in the last 168 hours. GFR: CrCl cannot be calculated (Patient's most recent lab result is older than the maximum 21 days allowed.). Liver Function Tests: No results for input(s): AST, ALT, ALKPHOS, BILITOT, PROT, ALBUMIN in the last 168 hours. No results for input(s): LIPASE, AMYLASE in the last 168 hours. No results for input(s): AMMONIA in the last 168 hours. Coagulation Profile: No results for input(s): INR, PROTIME in the last 168 hours. Cardiac Enzymes: No results for  input(s): CKTOTAL, CKMB, CKMBINDEX, TROPONINI in the last 168 hours. BNP (last 3 results) No results for input(s): PROBNP in the last 8760 hours. HbA1C: No results for input(s): HGBA1C in the last 72 hours. CBG: No results for input(s): GLUCAP in the last 168 hours. Lipid Profile: No results for input(s): CHOL, HDL, LDLCALC, TRIG, CHOLHDL, LDLDIRECT in the last 72 hours. Thyroid Function Tests: No results for input(s): TSH, T4TOTAL, FREET4, T3FREE, THYROIDAB in the last 72 hours. Anemia Panel: No results for input(s): VITAMINB12, FOLATE, FERRITIN, TIBC, IRON, RETICCTPCT in the last 72 hours. Urine analysis:    Component Value Date/Time   COLORURINE YELLOW 03/25/2023 1400   APPEARANCEUR HAZY (A) 03/25/2023 1400   APPEARANCEUR CLEAR 08/25/2012 1005   LABSPEC >1.030 (H) 03/25/2023 1400   LABSPEC 1.020 07/28/2014 1426   PHURINE 5.5 03/25/2023 1400   GLUCOSEU 100 (A) 03/25/2023 1400   GLUCOSEU NEGATIVE 07/28/2014 1426   HGBUR MODERATE (A) 03/25/2023 1400   BILIRUBINUR SMALL (A) 03/25/2023 1400   BILIRUBINUR NEGATIVE 08/25/2012 1005   KETONESUR TRACE (A) 03/25/2023 1400   PROTEINUR 100 (A) 03/25/2023 1400   NITRITE NEGATIVE 03/25/2023 1400   LEUKOCYTESUR NEGATIVE 03/25/2023 1400   LEUKOCYTESUR NEGATIVE 08/25/2012 1005    Radiological Exams on Admission: No results found.  EKG: Ordered  Assessment/Plan Principal Problem:   Limb ischemia Active Problems:   Acute lower limb ischemia  (please populate well all problems here in Problem List. (For example, if patient is on BP meds at home and you resume or decide to hold them, it is a problem that needs to be her. Same for CAD, COPD, HLD and so on)  Critical limb ischemia, right lower extremity History of PVD status post right SFA stenting in 2024 - Likely secondary to noncompliant with antiplatelet therapy. - Heparin  drip, n.p.o., vascular surgeon consulted in the ED. - Patient  reported that due to history of recurrent ulcer with severe GI bleed associated with NSAIDs, he does not tolerate aspirin .  HTN - Controlled continue amlodipine  and ARB  HLD - Continue statin  Anxiety/depression -Continue SSRIs  DVT prophylaxis: Heparin  drip Code Status: Full code Family Communication: None at bedside Disposition Plan: Patient sick with critical limb ischemia requiring parenteral anticoagulation and vascular surgery intervention, expect more than 2 midnight hospital stay. Consults called: Vascular surgery Admission status: PCU admit   Cort ONEIDA Mana MD [facility] Pager [phone]  05/13/2024, 9:42 AM       [1]  Allergies Allergen Reactions   Simvastatin     Other Reaction(s): myalgia arthralgia   Nsaids     Other Reaction(s): gi bleed   "

## 2024-05-13 NOTE — Progress Notes (Signed)
 PHARMACY - ANTICOAGULATION CONSULT NOTE  Pharmacy Consult for Heparin  infusion Indication: R/O stent thrombosis to RLE  Allergies[1]  Patient Measurements: Height: 5' 3 (160 cm) Weight: 77.1 kg (170 lb) IBW/kg (Calculated) : 56.9 HEPARIN  DW (KG): 72.9  Vital Signs: Temp: 98 F (36.7 C) (01/22 0843) BP: 149/76 (01/22 0843) Pulse Rate: 82 (01/22 0843)  Labs: Recent Labs    05/13/24 0908  HGB 15.5  HCT 46.4  PLT 187    CrCl cannot be calculated (Patient's most recent lab result is older than the maximum 21 days allowed.).   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  Patient has been off Plavix  x 1 mo.  Goal of Therapy:  Heparin  level 0.3-0.7 units/ml Monitor platelets by anticoagulation protocol: Yes   Plan:  Give 5000 units bolus x 1 Start heparin  infusion at 1250 units/hr Check anti-Xa level in 6 hours and daily while on heparin  Continue to monitor H&H and platelets  Bari Hamilton D 05/13/2024,9:44 AM      [1]  Allergies Allergen Reactions   Simvastatin     Other Reaction(s): myalgia arthralgia   Nsaids     Other Reaction(s): gi bleed

## 2024-05-13 NOTE — ED Provider Notes (Signed)
 "  [facility] Provider Note    Event Date/Time   First MD Initiated Contact with Patient 05/13/24 0848     (approximate)   History   Leg Pain and Numbness  Pt to ED for right leg and right foot intermittent numbness started Tuesday night. Reports has stents to right leg. States toes to right foot feeling cold the past few days. Skin warm to touch currently.    HPI Alan Trevino is a 64 y.o. male PMH hypertension, hyperlipidemia, prior episode of limb ischemia with percutaneous transluminal angioplasty and stent placement in right superficial femoral artery (02/28/2023), prior SBO presents for evaluation of right leg and foot numbness - Since Tuesday night patient has been having intermittent episodes where he gets pain in his right medial thigh extending down through his calf and into his foot.  Some intermittent associated numbness and feels right foot getting pale.  Does note that at time he had discoloration (purplish) of his 1st and 5th digit on his right foot. - Was previously on Plavix  but says this prescription ran out about a month ago so has not been taking any.  Does not take aspirin  due to history of gastric ulcers. - Last p.o. intake 7 AM - Currently notes mild discomfort in right leg  Per chart review, last seen by vascular surgery on 02/12/2024.  Noted to have duplex ultrasound of right lower extremity 07/03/2023 demonstrating uniform velocities throughout the femoral-popliteal system, stent widely patent, distal runoff preserved.  Outpatient vascular ultrasound lower extremity arterial duplex as well as vascular ultrasound ABI ordered, not yet complete.  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  Last vascular ultrasound studies in early October 2025.  Normal vascular ultrasound ABI and vascular ultrasound right lower extremity with patent stent.  No evidence of stenosis.     Physical Exam   Triage Vital  Signs: ED Triage Vitals  Encounter Vitals Group     BP 05/13/24 0843 (!) 149/76     Girls Systolic BP Percentile --      Girls Diastolic BP Percentile --      Boys Systolic BP Percentile --      Boys Diastolic BP Percentile --      Pulse Rate 05/13/24 0843 82     Resp 05/13/24 0843 18     Temp 05/13/24 0843 98 F (36.7 C)     Temp src --      SpO2 05/13/24 0843 97 %     Weight 05/13/24 0844 170 lb (77.1 kg)     Height 05/13/24 0844 5' 3 (1.6 m)     Head Circumference --      Peak Flow --      Pain Score 05/13/24 0844 4     Pain Loc --      Pain Education --      Exclude from Growth Chart --     Most recent vital signs: Vitals:   05/13/24 1303 05/13/24 1614  BP:  (!) 156/87  Pulse:  70  Resp:    Temp: 98 F (36.7 C) 98.6 F (37 C)  SpO2:  94%     General: Awake, no distress.  CV:  Good peripheral perfusion. RRR, RP 2+ Resp:  Normal effort. CTAB Abd:  No distention. Nontender to deep palpation throughout Other:  Bilateral lower extremities mildly cool to touch, no mottling, symmetric in this regard.  Right foot does appear somewhat more pale than left.  Strong pedal pulse on the left though unable to appreciate 1 on the right.  Unable to appreciate tibial pulses bilaterally.  Very mild tenderness to palpation medial thigh on right.   ED Results / Procedures / Treatments   Labs (all labs ordered are listed, but only abnormal results are displayed) Labs Reviewed  BASIC METABOLIC PANEL WITH GFR - Abnormal; Notable for the following components:      Result Value   Glucose, Bld 144 (*)    All other components within normal limits  LACTIC ACID, PLASMA - Abnormal; Notable for the following components:   Lactic Acid, Venous 2.6 (*)    All other components within normal limits  CBC WITH DIFFERENTIAL/PLATELET  PROTIME-INR  APTT  HIV ANTIBODY (ROUTINE TESTING W REFLEX)  HEPARIN  LEVEL (UNFRACTIONATED)     EKG  N/a   RADIOLOGY N/a    PROCEDURES:  Critical  Care performed: Yes, see critical care procedure note(s)  .Critical Care  Performed by: Clarine Ozell LABOR, MD Authorized by: Clarine Ozell LABOR, MD   Critical care provider statement:    Critical care time (minutes):  30   Critical care time was exclusive of:  Separately billable procedures and treating other patients   Critical care was necessary to treat or prevent imminent or life-threatening deterioration of the following conditions:  Circulatory failure   Critical care was time spent personally by me on the following activities:  Development of treatment plan with patient or surrogate, discussions with consultants, evaluation of patient's response to treatment, examination of patient, ordering and review of laboratory studies, ordering and review of radiographic studies, ordering and performing treatments and interventions, pulse oximetry, re-evaluation of patient's condition and review of old charts   I assumed direction of critical care for this patient from another provider in my specialty: no     Care discussed with: admitting provider      MEDICATIONS ORDERED IN ED: Medications  amLODipine  (NORVASC ) tablet 10 mg ( Oral Automatically Held 05/22/24 1000)    And  benazepril  (LOTENSIN ) tablet 20 mg ( Oral Automatically Held 05/22/24 1000)  atorvastatin  (LIPITOR) tablet 40 mg ( Oral Automatically Held 05/22/24 1000)  buPROPion  (WELLBUTRIN  XL) 24 hr tablet 300 mg ( Oral Automatically Held 05/22/24 1000)  busPIRone  (BUSPAR ) tablet 30 mg ( Oral Automatically Held 05/21/24 2200)  cyclobenzaprine  (FLEXERIL ) tablet 5 mg ( Oral MAR Hold 05/13/24 1610)  acetaminophen  (TYLENOL ) tablet 650 mg ( Oral MAR Hold 05/13/24 1610)    Or  acetaminophen  (TYLENOL ) suppository 650 mg ( Rectal MAR Hold 05/13/24 1610)  HYDROcodone -acetaminophen  (NORCO/VICODIN) 5-325 MG per tablet 1-2 tablet ( Oral MAR Hold 05/13/24 1610)  HYDROmorphone  (DILAUDID ) injection 0.5-1 mg ( Intravenous MAR Hold 05/13/24 1610)  traZODone   (DESYREL ) tablet 25 mg ( Oral MAR Hold 05/13/24 1610)  bisacodyl  (DULCOLAX) EC tablet 5 mg ( Oral MAR Hold 05/13/24 1610)  ondansetron  (ZOFRAN ) tablet 4 mg ( Oral MAR Hold 05/13/24 1610)    Or  ondansetron  (ZOFRAN ) injection 4 mg ( Intravenous MAR Hold 05/13/24 1610)  heparin  ADULT infusion 100 units/mL (25000 units/250mL) (1,250 Units/hr Intravenous New Bag/Given 05/13/24 1039)  methylphenidate  (RITALIN ) tablet 20 mg ( Oral Automatically Held 05/21/24 1200)  methylphenidate  (RITALIN ) tablet 10 mg ( Oral Automatically Held 05/21/24 1600)  heparin  bolus via infusion 5,000 Units (5,000 Units Intravenous Bolus from Bag 05/13/24 1039)     IMPRESSION / MDM / ASSESSMENT AND PLAN / ED COURSE  I reviewed the triage vital signs and the nursing notes.  DDX/MDM/AP: Differential diagnosis includes, but is not limited to, concern for stent stenosis or thrombosis, much less likely DVT based on history and exam.  Presentation not consistent with sciatica.  Plan: - Labs - Will discuss with vascular imaging regarding which imaging modality may be most appropriate for patient - N.p.o.  Patient's presentation is most consistent with acute presentation with potential threat to life or bodily function.  The patient is on the cardiac monitor to evaluate for evidence of arrhythmia and/or significant heart rate changes.  ED course below.  Discussed with vascular surgery who recommends heparin  initiation for presumed occlusion and admission to hospitalist.  Lactate elevated concerning for ischemia.  They will plan for procedural evaluation of vascularity in right lower extremity.  Admitted to hospitalist service for  Clinical Course as of 05/13/24 1634  Thu May 13, 2024  0905 Paging vascular [MM]  [phone] D/w Dr. Marea of vascular - Agrees with basic labs -Request admission to hospitalist service -Start heparin  -They will do procedural stent evaluation today or tomorrow [MM]  [phone]  Hospitalist consult order placed [MM]    Clinical Course User Index [MM] Clarine Ozell LABOR, MD     FINAL CLINICAL IMPRESSION(S) / ED DIAGNOSES   Final diagnoses:  Right leg pain  Decreased pedal pulses     Rx / DC Orders   ED Discharge Orders     None        Note:  This document was prepared using Dragon voice recognition software and may include unintentional dictation errors.   Clarine Ozell LABOR, MD 05/13/24 [phone]  "

## 2024-05-14 ENCOUNTER — Other Ambulatory Visit: Payer: Self-pay

## 2024-05-14 ENCOUNTER — Encounter: Payer: Self-pay | Admitting: Vascular Surgery

## 2024-05-14 DIAGNOSIS — I998 Other disorder of circulatory system: Secondary | ICD-10-CM | POA: Diagnosis not present

## 2024-05-14 DIAGNOSIS — Z95828 Presence of other vascular implants and grafts: Secondary | ICD-10-CM

## 2024-05-14 LAB — CBC
HCT: 42 % (ref 39.0–52.0)
Hemoglobin: 14 g/dL (ref 13.0–17.0)
MCH: 31.9 pg (ref 26.0–34.0)
MCHC: 33.3 g/dL (ref 30.0–36.0)
MCV: 95.7 fL (ref 80.0–100.0)
Platelets: 166 K/uL (ref 150–400)
RBC: 4.39 MIL/uL (ref 4.22–5.81)
RDW: 12.5 % (ref 11.5–15.5)
WBC: 10.9 K/uL — ABNORMAL HIGH (ref 4.0–10.5)
nRBC: 0 % (ref 0.0–0.2)

## 2024-05-14 LAB — LIPID PANEL
Cholesterol: 197 mg/dL (ref 0–200)
HDL: 48 mg/dL
LDL Cholesterol: 126 mg/dL — ABNORMAL HIGH (ref 0–99)
Total CHOL/HDL Ratio: 4.1 ratio
Triglycerides: 116 mg/dL
VLDL: 23 mg/dL (ref 0–40)

## 2024-05-14 LAB — HEPARIN LEVEL (UNFRACTIONATED)
Heparin Unfractionated: 0.48 [IU]/mL (ref 0.30–0.70)
Heparin Unfractionated: 0.34 [IU]/mL (ref 0.30–0.70)

## 2024-05-14 LAB — HIV ANTIBODY (ROUTINE TESTING W REFLEX): HIV Screen 4th Generation wRfx: NONREACTIVE

## 2024-05-14 MED ORDER — PANTOPRAZOLE SODIUM 20 MG PO TBEC
20.0000 mg | DELAYED_RELEASE_TABLET | Freq: Every day | ORAL | 0 refills | Status: DC
  Filled 2024-05-14: qty 30, 30d supply, fill #0

## 2024-05-14 MED ORDER — HYDROCODONE-ACETAMINOPHEN 5-325 MG PO TABS
1.0000 | ORAL_TABLET | Freq: Four times a day (QID) | ORAL | 0 refills | Status: DC | PRN
  Filled 2024-05-14: qty 10, 2d supply, fill #0

## 2024-05-14 MED ORDER — CLOPIDOGREL BISULFATE 75 MG PO TABS
75.0000 mg | ORAL_TABLET | Freq: Every day | ORAL | 0 refills | Status: DC
  Filled 2024-05-14: qty 30, 30d supply, fill #0

## 2024-05-14 MED ORDER — PANTOPRAZOLE SODIUM 20 MG PO TBEC
20.0000 mg | DELAYED_RELEASE_TABLET | Freq: Every day | ORAL | Status: DC
  Administered 2024-05-14: 20 mg via ORAL
  Filled 2024-05-14: qty 1

## 2024-05-14 MED ORDER — METHYLPHENIDATE HCL 10 MG PO TABS
20.0000 mg | ORAL_TABLET | Freq: Two times a day (BID) | ORAL | Status: DC
  Administered 2024-05-14 (×2): 20 mg via ORAL
  Filled 2024-05-14 (×2): qty 2

## 2024-05-14 MED ORDER — ROSUVASTATIN CALCIUM 10 MG PO TABS: 20.0000 mg | ORAL_TABLET | Freq: Every day | ORAL | Status: DC

## 2024-05-14 MED ORDER — EZETIMIBE 10 MG PO TABS
10.0000 mg | ORAL_TABLET | Freq: Every day | ORAL | 0 refills | Status: DC
  Filled 2024-05-14: qty 30, 30d supply, fill #0

## 2024-05-14 MED ORDER — ATORVASTATIN CALCIUM 40 MG PO TABS
40.0000 mg | ORAL_TABLET | Freq: Every day | ORAL | 0 refills | Status: DC
  Filled 2024-05-14: qty 30, 30d supply, fill #0

## 2024-05-14 MED ORDER — ASPIRIN 81 MG PO TBEC
81.0000 mg | DELAYED_RELEASE_TABLET | Freq: Every day | ORAL | 0 refills | Status: DC
  Filled 2024-05-14: qty 30, 30d supply, fill #0

## 2024-05-14 NOTE — Progress Notes (Addendum)
 [facility]  Daily Progress Note   Subjective  -   Still complaining of some thigh pain.  Foot is warm.  No evidence of rest pain after revascularization.  Minimal swelling  Objective Vitals:   05/13/24 2119 05/13/24 2354 05/14/24 0559 05/14/24 0757  BP: 126/73 116/70 (!) 140/75 (!) 140/77  Pulse: 69 67 65 69  Resp: 20 20 18 16   Temp: 98.1 F (36.7 C) 98.1 F (36.7 C) 98.3 F (36.8 C) 97.6 F (36.4 C)  TempSrc:      SpO2: 95% 90% 92% 93%  Weight:      Height:       No intake or output data in the 24 hours ending 05/14/24 1212  PULM  CTAB CV  RRR VASC  strongly palpable dorsalis pedis pulse on the right.  Access site is without hematoma.  Laboratory CBC    Component Value Date/Time   WBC 10.9 (H) 05/14/2024 0344   HGB 14.0 05/14/2024 0344   HGB 14.8 11/16/2021 1041   HCT 42.0 05/14/2024 0344   HCT 44.4 11/16/2021 1041   PLT 166 05/14/2024 0344   PLT 220 11/16/2021 1041    BMET    Component Value Date/Time   NA 141 05/13/2024 0908   NA 142 07/07/2023 0902   NA 139 04/16/2013 1607   K 3.8 05/13/2024 0908   K 4.0 04/16/2013 1607   CL 106 05/13/2024 0908   CL 97 (L) 04/16/2013 1607   CO2 23 05/13/2024 0908   CO2 31 04/16/2013 1607   GLUCOSE 144 (H) 05/13/2024 0908   GLUCOSE 106 (H) 04/16/2013 1607   BUN 9 05/13/2024 0908   BUN 11 07/07/2023 0902   BUN 10 04/16/2013 1607   CREATININE 1.11 05/13/2024 0908   CREATININE 1.12 04/16/2013 1607   CALCIUM  9.3 05/13/2024 0908   CALCIUM  9.6 04/16/2013 1607   GFRNONAA >60 05/13/2024 0908   GFRNONAA >60 04/16/2013 1607   GFRAA 69 03/09/2020 1423   GFRAA >60 04/16/2013 1607    Assessment/Planning: POD 1 status post right SFA thrombectomy and stent placement  Heparin  can be stopped and he can transition to aspirin , Plavix , and a statin agent which she should be discharged home on. He should follow-up in our office in 2 to 3 weeks with ABIs. No further recommendations from vascular point  of view, and patient is stable from discharge from our point of view.   Selinda Gu  05/14/2024, 12:12 PM

## 2024-05-14 NOTE — Discharge Summary (Signed)
 "    Physician Discharge Summary   Patient: Alan Trevino MRN: [id number]  DOB: [birth year]   Admit:     Date of Admission: 05/13/2024 Admitted from: home   Discharge: Date of discharge: 05/14/24 Disposition: Home Condition at discharge: good  CODE STATUS: FULL CODE     Discharge Physician: Laneta Blunt, DO [facility]     PCP: Manya Toribio SQUIBB, PA  Recommendations for Outpatient Follow-up:  Follow up with PCP Manya Toribio SQUIBB, PA in 2-4 weeks Follow as directed next 2 weeks w/ vascular surgery    Discharge Instructions     Increase activity slowly   Complete by: As directed          Discharge Diagnoses: Principal Problem:   Limb ischemia Active Problems:   Acute lower limb ischemia       Hospital Course: Alan Trevino is a 64 y.o. male with medical history significant of PVD ischemic limb status post right-sided SFA stenting November 2024, HTN, HLD, GERD, history of GI bleed, anxiety/depression, presented with worsening of right leg and right foot pain.   HPI: Patient used to be on Plavix  after a stent placed in in right SFA in November 2024.  1 months ago he ran out of Plavix  but forgot to refill.  This time, symptoms started about 1 week ago he started to have claudication with dull-like pain radiating to the back of the knee, last few days before admission he started develop right calf pain and last night 01/21 he developed resting pain of right foot which also felt cold.  Denied any chest pain shortness of breath.   Hospital course / significant events: 01/22: to ED. Diminished pedal pulses on right side. Patient started on heparin  drip in the ED.  Vascular surgeon consulted -->  RLE angio and thrombectomy + stent placement to the right SFA 01/23: doing well post procedure, ok for DC home on DAPT     Consultants:  Vascular surgery   Procedures/Surgeries: 05/13/24 RLE angio and thrombectomy + stent placement to the right SFA - Dr  Marea      ASSESSMENT & PLAN:   Critical limb ischemia right lower extremity, secondary to noncompliant with antiplatelet therapy. Chronic PAD/PVD, status post right SFA stenting in 2024  05/13/24 RLE angio and thrombectomy + stent placement to the right SFA - Dr Marea Heparin  --> DAPT Vascular surgery follow up  Statin + zetia     HTN continue amlodipine  and ARB   HLD Statin  Anxiety/depression ADHD Ritalin  Wellbutrin  BuSpar      Class 1 obesity based on BMI: Body mass index is 30.11 kg/m.SABRA Significantly low or high BMI is associated with higher medical risk.  Underweight - under 18  overweight - 25 to 29 obese - 30 or more Class 1 obesity: BMI of 30.0 to 34 Class 2 obesity: BMI of 35.0 to 39 Class 3 obesity: BMI of 40.0 to 49 Super Morbid Obesity: BMI 50-59 Super-super Morbid Obesity: BMI 60+ Healthy nutrition and physical activity advised as adjunct to other disease management and risk reduction treatments               Discharge Instructions  Allergies as of 05/14/2024       Reactions   Simvastatin    Other Reaction(s): myalgia arthralgia   Nsaids    Other Reaction(s): gi bleed        Medication List     TAKE these medications    amLODipine -benazepril  10-20 MG capsule Commonly known  as: LOTREL Take 1 capsule by mouth daily.   aspirin  EC 81 MG tablet Take 1 tablet (81 mg total) by mouth daily. Swallow whole. Start taking on: May 15, 2024   atorvastatin  40 MG tablet Commonly known as: LIPITOR Take 1 tablet (40 mg total) by mouth daily. TAKE 1 TABLET BY MOUTH EVERY DAY   buPROPion  300 MG 24 hr tablet Commonly known as: WELLBUTRIN  XL Take 300 mg by mouth daily.   busPIRone  30 MG tablet Commonly known as: BUSPAR  Take 30 mg by mouth 2 (two) times daily.   clopidogrel  75 MG tablet Commonly known as: PLAVIX  Take 1 tablet (75 mg total) by mouth daily. Start taking on: May 15, 2024   cyclobenzaprine  5 MG tablet Commonly  known as: FLEXERIL  Take 1 tablet (5 mg total) by mouth 3 (three) times daily as needed for muscle spasms.   ezetimibe  10 MG tablet Commonly known as: Zetia  Take 1 tablet (10 mg total) by mouth daily.   HYDROcodone -acetaminophen  5-325 MG tablet Commonly known as: NORCO/VICODIN Take 1-2 tablets by mouth every 6 (six) hours as needed for severe pain (pain score 7-10).   methylphenidate  20 MG tablet Commonly known as: RITALIN  Take 10-20 mg by mouth 3 (three) times daily with meals.   pantoprazole  20 MG tablet Commonly known as: PROTONIX  Take 1 tablet (20 mg total) by mouth daily.         Follow-up Information     Brown, Fallon E, NP Follow up in 3 week(s).   Specialty: Vascular Surgery Why: with ABIs Contact information: [address] [place] [address] [phone]                 Allergies[1]   Subjective: pt reports leg pain post revascularization but better than pre-procedure. No chest pain / SOB, tolerating diet, ambulating independently    Discharge Exam: BP (!) 140/77 (BP Location: Left Arm)   Pulse 69   Temp 97.6 F (36.4 C)   Resp 16   Ht 5' 3 (1.6 m)   Wt 77.1 kg   SpO2 93%   BMI 30.11 kg/m  General: Pt is alert, awake, not in acute distress Cardiovascular: RRR, S1/S2 +, no rubs, no gallops Respiratory: CTA bilaterally, no wheezing, no rhonchi Abdominal: Soft, NT, ND, bowel sounds + Extremities: no edema, no cyanosis     The results of significant diagnostics from this hospitalization (including imaging, microbiology, ancillary and laboratory) are listed below for reference.     Microbiology: No results found for this or any previous visit (from the past 240 hours).   Labs: BNP (last 3 results) No results for input(s): BNP in the last 8760 hours. Basic Metabolic Panel: Recent Labs  Lab 05/13/24 0908  NA 141  K 3.8  CL 106  CO2 23  GLUCOSE 144*  BUN 9  CREATININE 1.11  CALCIUM  9.3   Liver Function  Tests: No results for input(s): AST, ALT, ALKPHOS, BILITOT, PROT, ALBUMIN in the last 168 hours. No results for input(s): LIPASE, AMYLASE in the last 168 hours. No results for input(s): AMMONIA in the last 168 hours. CBC: Recent Labs  Lab 05/13/24 0908 05/14/24 0344  WBC 9.6 10.9*  NEUTROABS 7.5  --   HGB 15.5 14.0  HCT 46.4 42.0  MCV 94.9 95.7  PLT 187 166   Cardiac Enzymes: No results for input(s): CKTOTAL, CKMB, CKMBINDEX, TROPONINI in the last 168 hours. BNP: Invalid input(s): POCBNP CBG: No results for input(s): GLUCAP in the  last 168 hours. D-Dimer No results for input(s): DDIMER in the last 72 hours. Hgb A1c No results for input(s): HGBA1C in the last 72 hours. Lipid Profile Recent Labs    05/14/24 1017  CHOL 197  HDL 48  LDLCALC 126*  TRIG 116  CHOLHDL 4.1   Thyroid function studies No results for input(s): TSH, T4TOTAL, T3FREE, THYROIDAB in the last 72 hours.  Invalid input(s): FREET3 Anemia work up No results for input(s): VITAMINB12, FOLATE, FERRITIN, TIBC, IRON, RETICCTPCT in the last 72 hours. Urinalysis    Component Value Date/Time   COLORURINE YELLOW 03/25/2023 1400   APPEARANCEUR HAZY (A) 03/25/2023 1400   APPEARANCEUR CLEAR 08/25/2012 1005   LABSPEC >1.030 (H) 03/25/2023 1400   LABSPEC 1.020 07/28/2014 1426   PHURINE 5.5 03/25/2023 1400   GLUCOSEU 100 (A) 03/25/2023 1400   GLUCOSEU NEGATIVE 07/28/2014 1426   HGBUR MODERATE (A) 03/25/2023 1400   BILIRUBINUR SMALL (A) 03/25/2023 1400   BILIRUBINUR NEGATIVE 08/25/2012 1005   KETONESUR TRACE (A) 03/25/2023 1400   PROTEINUR 100 (A) 03/25/2023 1400   NITRITE NEGATIVE 03/25/2023 1400   LEUKOCYTESUR NEGATIVE 03/25/2023 1400   LEUKOCYTESUR NEGATIVE 08/25/2012 1005   Sepsis Labs Recent Labs  Lab 05/13/24 0908 05/14/24 0344  WBC 9.6 10.9*   Microbiology No results found for this or any previous visit (from the past 240  hours). Imaging PERIPHERAL VASCULAR CATHETERIZATION Result Date: 05/13/2024 See surgical note for result.     Time coordinating discharge: over 30 minutes  SIGNED:  Chesnie Capell DO [facility]       [1]  Allergies Allergen Reactions   Simvastatin     Other Reaction(s): myalgia arthralgia   Nsaids     Other Reaction(s): gi bleed   "

## 2024-05-14 NOTE — Hospital Course (Addendum)
 Alan Trevino is a 64 y.o. male with medical history significant of PVD ischemic limb status post right-sided SFA stenting November 2024, HTN, HLD, GERD, history of GI bleed, anxiety/depression, presented with worsening of right leg and right foot pain.   HPI: Patient used to be on Plavix  after a stent placed in in right SFA in November 2024.  1 months ago he ran out of Plavix  but forgot to refill.  This time, symptoms started about 1 week ago he started to have claudication with dull-like pain radiating to the back of the knee, last few days before admission he started develop right calf pain and last night 01/21 he developed resting pain of right foot which also felt cold.  Denied any chest pain shortness of breath.   Hospital course / significant events: 01/22: to ED. Diminished pedal pulses on right side. Patient started on heparin  drip in the ED.  Vascular surgeon consulted -->  RLE angio and thrombectomy + stent placement to the right SFA 01/23: ***     Consultants:  Vascular surgery   Procedures/Surgeries: 05/13/24 RLE angio and thrombectomy + stent placement to the right SFA - Dr Marea      ASSESSMENT & PLAN:   Critical limb ischemia right lower extremity, secondary to noncompliant with antiplatelet therapy. Chronic PAD/PVD, status post right SFA stenting in 2024  05/13/24 RLE angio and thrombectomy + stent placement to the right SFA - Dr Marea Heparin  Vascular surgery recs ***  DAPT w/ plavix  + ASA (***Patient reported that due to history of recurrent ulcer with severe GI bleed associated with NSAIDs, he does not tolerate aspirin  *** Statin    HTN continue amlodipine  and ARB   HLD statin (rosuvastatin  initiated here, hold/dc home atorvastatin  ***)   Anxiety/depression ADHD Ritalin  Wellbutrin  BuSpar  Added trazodone  here prn insomnia      Class 1 obesity based on BMI: Body mass index is 30.11 kg/m.SABRA Significantly low or high BMI is associated with higher medical  risk.  Underweight - under 18  overweight - 25 to 29 obese - 30 or more Class 1 obesity: BMI of 30.0 to 34 Class 2 obesity: BMI of 35.0 to 39 Class 3 obesity: BMI of 40.0 to 49 Super Morbid Obesity: BMI 50-59 Super-super Morbid Obesity: BMI 60+ Healthy nutrition and physical activity advised as adjunct to other disease management and risk reduction treatments    DVT prophylaxis: on heparin   IV fluids: no continuous IV fluids  Nutrition: cardiac diet Central lines / other devices: none  Code Status: FULL CODE ACP documentation reviewed:  none on file in [facility]  TOC needs: TBD *** Medical barriers to dispo at this time: ***. Expected readiness for discharge per TOC at this time:

## 2024-05-14 NOTE — Progress Notes (Signed)
 PHARMACY - ANTICOAGULATION CONSULT NOTE  Pharmacy Consult for Heparin  infusion Indication: R/O stent thrombosis to RLE  Allergies[1]  Patient Measurements: Height: 5' 3 (160 cm) Weight: 77.1 kg (170 lb) IBW/kg (Calculated) : 56.9 HEPARIN  DW (KG): 72.9  Vital Signs: Temp: 98.1 F (36.7 C) (01/22 2354) Temp Source: Temporal (01/22 1718) BP: 116/70 (01/22 2354) Pulse Rate: 67 (01/22 2354)  Labs: Recent Labs    05/13/24 0908 05/13/24 0956 05/13/24 1839 05/14/24 0344  HGB 15.5  --   --  14.0  HCT 46.4  --   --  42.0  PLT 187  --   --  166  APTT  --  27  --   --   LABPROT 13.5  --   --   --   INR 1.0  --   --   --   HEPARINUNFRC  --   --  >1.10* 0.48  CREATININE 1.11  --   --   --     Estimated Creatinine Clearance: 61.8 mL/min (by C-G formula based on SCr of 1.11 mg/dL).   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 90 capsule 1 05/13/2024   buPROPion  (WELLBUTRIN  XL) 300 MG 24 hr tablet Take 300 mg by mouth daily.    05/13/2024   busPIRone  (BUSPAR ) 30 MG tablet Take 30 mg by mouth 2 (two) times daily.    05/13/2024   cyclobenzaprine  (FLEXERIL ) 5 MG tablet Take 1 tablet (5 mg total) by mouth 3 (three) times daily as needed for muscle  spasms. 90 tablet 1 Unknown   methylphenidate  (RITALIN ) 20 MG tablet Take 10-20 mg by mouth 3 (three) times daily with meals.   05/13/2024   pantoprazole  (PROTONIX ) 20 MG tablet Take 20 mg by mouth daily.   05/13/2024   atorvastatin  (LIPITOR) 40 MG tablet Take 1 tablet (40 mg total) by mouth daily. TAKE 1 TABLET BY MOUTH EVERY DAY 30 tablet 11     Assessment: 64 y/o M with a known h/o LE ischemia and stenting presenting to the ED with leg pain and numbness.  Patient has been off Plavix  x 1 mo.  Goal of Therapy:  Heparin  level 0.3-0.7 units/ml Monitor platelets by anticoagulation protocol: Yes   Date Time HL Rate/Comment  1/22 1839 >1.10 Supratherapeutic @ 1250 units/hr 1/23 0344 0.48 Therapeutic  x 1  Plan:  Continue heparin  infusion at 1,050 units/hr Recheck anti-Xa level in 6 hours to confirm Continue to monitor H&H and platelets  Rankin CANDIE Dills, PharmD, [facility] 05/14/2024 4:59 AM         [1]  Allergies Allergen Reactions   Simvastatin     Other Reaction(s): myalgia arthralgia   Nsaids  Other Reaction(s): gi bleed

## 2024-05-14 NOTE — TOC CM/SW Note (Signed)
 Transition of Care [facility]) - Inpatient Brief Assessment   Patient Details  Name: Alan Trevino MRN: [id number] Date of Birth: 11-04-1960  Transition of Care [facility]) CM/SW Contact:    Lauraine JAYSON Carpen, LCSW Phone Number: 05/14/2024, 12:24 PM   Clinical Narrative: Patient has orders to discharge home today. Chart reviewed. No TOC needs identified. CSW signing off.  Transition of Care Asessment: Insurance and Status: Insurance coverage has been reviewed Patient has primary care physician: Yes Home environment has been reviewed: Single family home Prior level of function:: Not documented Prior/Current Home Services: No current home services Social Drivers of Health Review: SDOH reviewed no interventions necessary Readmission risk has been reviewed: Yes Transition of care needs: no transition of care needs at this time

## 2024-05-14 NOTE — Progress Notes (Signed)
 PHARMACY - ANTICOAGULATION CONSULT NOTE  Pharmacy Consult for Heparin  infusion Indication: R/O stent thrombosis to RLE  Allergies[1]  Patient Measurements: Height: 5' 3 (160 cm) Weight: 77.1 kg (170 lb) IBW/kg (Calculated) : 56.9 HEPARIN  DW (KG): 72.9  Vital Signs: Temp: 97.6 F (36.4 C) (01/23 0757) BP: 140/77 (01/23 0757) Pulse Rate: 69 (01/23 0757)  Labs: Recent Labs    05/13/24 0908 05/13/24 0956 05/13/24 1839 05/14/24 0344 05/14/24 1017  HGB 15.5  --   --  14.0  --   HCT 46.4  --   --  42.0  --   PLT 187  --   --  166  --   APTT  --  27  --   --   --   LABPROT 13.5  --   --   --   --   INR 1.0  --   --   --   --   HEPARINUNFRC  --   --  >1.10* 0.48 0.34  CREATININE 1.11  --   --   --   --     Estimated Creatinine Clearance: 61.8 mL/min (by C-G formula based on SCr of 1.11 mg/dL).   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 90 capsule 1 05/13/2024   buPROPion  (WELLBUTRIN  XL) 300 MG 24 hr tablet Take 300 mg by mouth daily.    05/13/2024   busPIRone  (BUSPAR ) 30 MG tablet Take 30 mg by mouth 2 (two) times daily.    05/13/2024   cyclobenzaprine  (FLEXERIL ) 5 MG tablet Take 1 tablet (5 mg total) by mouth 3 (three) times daily as  needed for muscle spasms. 90 tablet 1 Unknown   methylphenidate  (RITALIN ) 20 MG tablet Take 10-20 mg by mouth 3 (three) times daily with meals.   05/13/2024   pantoprazole  (PROTONIX ) 20 MG tablet Take 20 mg by mouth daily.   05/13/2024   atorvastatin  (LIPITOR) 40 MG tablet Take 1 tablet (40 mg total) by mouth daily. TAKE 1 TABLET BY MOUTH EVERY DAY 30 tablet 11     Assessment: 64 y/o M with a known h/o LE ischemia and stenting presenting to the ED with leg pain and numbness. Patient has been off Plavix  x 1 mo.     Goal of Therapy:  Heparin  level 0.3-0.7 units/ml Monitor platelets by anticoagulation protocol: Yes   Date Time HL Rate/Comment  1/22 1839 >1.10 Supratherapeutic @ 1250 units/hr 1/23 0344 0.48 Therapeutic  x 1 1/23 1017 0.34 Therapeutic x 2  Plan:  - Continue heparin  infusion at 1,050 units/hr - Will switch to daily HL monitoring since HL is therapeutic x 2  - Will continue to monitor CBC and HL daily with  AM labs    Ransom Blanch PGY-1 Pharmacy Resident   - [facility]  05/14/2024 11:50 AM           [1]  Allergies Allergen Reactions   Simvastatin     Other Reaction(s): myalgia arthralgia   Nsaids     Other Reaction(s): gi bleed

## 2024-05-17 ENCOUNTER — Telehealth: Payer: Self-pay

## 2024-05-17 NOTE — Transitions of Care (Post Inpatient/ED Visit) (Signed)
 "  05/17/2024  Name: Alan Trevino MRN: [id number] DOB: Oct 04, 1960  Today's TOC FU Call Status: Today's TOC FU Call Status:: Successful TOC FU Call Completed TOC FU Call Complete Date: 05/17/24  Patient's Name and Date of Birth confirmed. Name, DOB  Transition Care Management Follow-up Telephone Call Date of Discharge: 05/14/24 Discharge Facility: [facility]) Type of Discharge: Inpatient Admission Primary Inpatient Discharge Diagnosis:: right leg pain How have you been since you were released from the hospital?: Better Any questions or concerns?: No  Items Reviewed: Did you receive and understand the discharge instructions provided?: No Medications obtained,verified, and reconciled?: Yes (Medications Reviewed) Any new allergies since your discharge?: No Dietary orders reviewed?: Yes Do you have support at home?: Yes People in Home [RPT]: spouse  Medications Reviewed Today: Medications Reviewed Today     Reviewed by Emmitt Pan, LPN (Licensed Practical Nurse) on 05/17/24 at 1115  Med List Status: <None>   Medication Order Taking? Sig Documenting Provider Last Dose Status Informant  amLODipine -benazepril  (LOTREL) 10-20 MG capsule [id number] Yes Take 1 capsule by mouth daily. Manya Toribio SQUIBB, PA  Active Self  aspirin  EC 81 MG tablet [id number] Yes Take 1 tablet (81 mg total) by mouth daily. Swallow whole. Alexander, Natalie, DO  Active   atorvastatin  (LIPITOR) 40 MG tablet [id number] Yes Take 1 tablet (40 mg total) by mouth daily. TAKE 1 TABLET BY MOUTH EVERY DAY Alexander, Natalie, DO  Active   buPROPion  (WELLBUTRIN  XL) 300 MG 24 hr tablet [id number] Yes Take 300 mg by mouth daily.  [provider]  Active Self           Med Note STEFANI, [facility]   Thu Nov 28, 2016  1:45 PM)    busPIRone  (BUSPAR ) 30 MG tablet [id number] Yes Take 30 mg by mouth 2 (two) times daily.  [provider]  Active Self           Med Note STEFANI,  [facility]   Thu Nov 28, 2016  1:45 PM)    clopidogrel  (PLAVIX ) 75 MG tablet [id number] Yes Take 1 tablet (75 mg total) by mouth daily. Alexander, Natalie, DO  Active   cyclobenzaprine  (FLEXERIL ) 5 MG tablet [id number] Yes Take 1 tablet (5 mg total) by mouth 3 (three) times daily as needed for muscle spasms. Manya Toribio SQUIBB, PA  Active Self  ezetimibe  (ZETIA ) 10 MG tablet [id number] Yes Take 1 tablet (10 mg total) by mouth daily. Alexander, Natalie, DO  Active   HYDROcodone -acetaminophen  (NORCO/VICODIN) 5-325 MG tablet [id number] Yes Take 1-2 tablets by mouth every 6 (six) hours as needed for severe pain (pain score 7-10). Alexander, Natalie, DO  Active   methylphenidate  (RITALIN ) 20 MG tablet [id number] Yes Take 10-20 mg by mouth 3 (three) times daily with meals. [provider]  Active Self           Med Note CATHY OVAL DEL   Fri Feb 28, 2023  3:40 PM) Patient takes 20 mg every morning with breakfast, 20 mg with lunch and 10 mg in the afternoon, as needed  pantoprazole  (PROTONIX ) 20 MG tablet [id number] Yes Take 1 tablet (20 mg total) by mouth daily. Alexander, Natalie, DO  Active             Home Care and Equipment/Supplies: Were Home Health Services Ordered?: NA Any new equipment or medical supplies ordered?: NA  Functional Questionnaire: Do you need assistance with bathing/showering or dressing?: No Do you need assistance with meal preparation?: No Do  you need assistance with eating?: No Do you have difficulty maintaining continence: No Do you need assistance with getting out of bed/getting out of a chair/moving?: No Do you have difficulty managing or taking your medications?: No  Follow up appointments reviewed: PCP Follow-up appointment confirmed?: Yes Date of PCP follow-up appointment?: 05/28/24 Follow-up Provider: [facility] Follow-up appointment confirmed?: No Reason Specialist Follow-Up Not Confirmed: Patient has Specialist Provider Number and will  Call for Appointment Do you need transportation to your follow-up appointment?: No Do you understand care options if your condition(s) worsen?: Yes-patient verbalized understanding    SIGNATURE Julian Lemmings, LPN [facility] Nurse Health Advisor Direct Dial [phone]  "

## 2024-05-22 ENCOUNTER — Other Ambulatory Visit: Payer: Self-pay | Admitting: Physician Assistant

## 2024-05-24 NOTE — Telephone Encounter (Signed)
 Requested medication (s) are due for refill today: yes  Requested medication (s) are on the active medication list: yes  Last refill:  03/24/24  Future visit scheduled: yes  Notes to clinic:  Unable to refill per protocol, cannot delegate.      Requested Prescriptions  Pending Prescriptions Disp Refills   cyclobenzaprine  (FLEXERIL ) 5 MG tablet [Pharmacy Med Name: CYCLOBENZAPRINE  5 MG TABLET] 90 tablet 1    Sig: TAKE 1 TABLET BY MOUTH THREE TIMES A DAY AS NEEDED FOR MUSCLE SPASMS     Not Delegated - Analgesics:  Muscle Relaxants Failed - 05/24/2024  4:07 PM      Failed - This refill cannot be delegated      Passed - Valid encounter within last 6 months    Recent Outpatient Visits           5 months ago Essential (primary) hypertension   [facility], Toribio SQUIBB, PA   10 months ago Essential hypertension   [facility] Joshua Cathryne JAYSON, MD

## 2024-05-27 ENCOUNTER — Telehealth (INDEPENDENT_AMBULATORY_CARE_PROVIDER_SITE_OTHER): Payer: Self-pay | Admitting: Nurse Practitioner

## 2024-05-27 NOTE — Telephone Encounter (Signed)
 Placed in NP box, call placed to patient to advise turnaround for completed paperwork is 14 business days that would be the 25th of this month, patient advises hed like it sent off for him via fax and a copy left for him to pickup when complete

## 2024-05-27 NOTE — Telephone Encounter (Signed)
 Patient has dropped off FMLA paperwork to be filled out. It is in the nurse's box at the front.

## 2024-05-28 ENCOUNTER — Ambulatory Visit: Admitting: Physician Assistant

## 2024-05-28 ENCOUNTER — Encounter: Payer: Self-pay | Admitting: Physician Assistant

## 2024-05-28 VITALS — BP 102/72 | HR 75 | Temp 97.6°F | Ht 63.0 in | Wt 189.0 lb

## 2024-05-28 DIAGNOSIS — I70213 Atherosclerosis of native arteries of extremities with intermittent claudication, bilateral legs: Secondary | ICD-10-CM

## 2024-05-28 DIAGNOSIS — R41840 Attention and concentration deficit: Secondary | ICD-10-CM | POA: Insufficient documentation

## 2024-05-28 DIAGNOSIS — Z91148 Patient's other noncompliance with medication regimen for other reason: Secondary | ICD-10-CM

## 2024-05-28 DIAGNOSIS — I10 Essential (primary) hypertension: Secondary | ICD-10-CM

## 2024-05-28 DIAGNOSIS — E66811 Obesity, class 1: Secondary | ICD-10-CM | POA: Insufficient documentation

## 2024-05-28 DIAGNOSIS — E781 Pure hyperglyceridemia: Secondary | ICD-10-CM

## 2024-05-28 DIAGNOSIS — K219 Gastro-esophageal reflux disease without esophagitis: Secondary | ICD-10-CM | POA: Insufficient documentation

## 2024-05-28 DIAGNOSIS — I739 Peripheral vascular disease, unspecified: Secondary | ICD-10-CM | POA: Insufficient documentation

## 2024-05-28 MED ORDER — ROSUVASTATIN CALCIUM 10 MG PO TABS: 10.0000 mg | ORAL_TABLET | Freq: Every day | ORAL | 1 refills | Status: AC

## 2024-05-28 MED ORDER — EZETIMIBE 10 MG PO TABS: 10.0000 mg | ORAL_TABLET | Freq: Every day | ORAL | 1 refills | Status: AC

## 2024-05-28 MED ORDER — ASPIRIN 81 MG PO TBEC: 81.0000 mg | DELAYED_RELEASE_TABLET | Freq: Every day | ORAL | 0 refills | Status: AC

## 2024-05-28 MED ORDER — PANTOPRAZOLE SODIUM 20 MG PO TBEC: 20.0000 mg | DELAYED_RELEASE_TABLET | Freq: Every day | ORAL | 1 refills | Status: AC

## 2024-05-28 MED ORDER — AMLODIPINE BESY-BENAZEPRIL HCL 10-20 MG PO CAPS: 1.0000 | ORAL_CAPSULE | Freq: Every day | ORAL | 1 refills | Status: AC

## 2024-05-28 MED ORDER — CLOPIDOGREL BISULFATE 75 MG PO TABS: 75.0000 mg | ORAL_TABLET | Freq: Every day | ORAL | 1 refills | Status: AC

## 2024-05-28 NOTE — Assessment & Plan Note (Signed)
 Refill antilipid medications, Plavix , and aspirin .  This is his second occurrence of limb ischemia requiring surgical intervention.  He will likely be on DAPT for life.  Emphasized the importance of medication compliance. Orders:   aspirin  EC 81 MG tablet; Take 1 tablet (81 mg total) by mouth daily. Swallow whole.   rosuvastatin  (CRESTOR ) 10 MG tablet; Take 1 tablet (10 mg total) by mouth daily.   clopidogrel  (PLAVIX ) 75 MG tablet; Take 1 tablet (75 mg total) by mouth daily.   ezetimibe  (ZETIA ) 10 MG tablet; Take 1 tablet (10 mg total) by mouth daily.

## 2024-05-28 NOTE — Assessment & Plan Note (Signed)
 Blood pressure seems well-controlled with current therapy, perhaps even a bit low.  For now we will keep the dose of pharmacotherapy the same but consider dose decrease in the future especially if symptomatic. Orders:   amLODipine -benazepril  (LOTREL) 10-20 MG capsule; Take 1 capsule by mouth daily.

## 2024-05-28 NOTE — Progress Notes (Signed)
 "   Date:  05/28/2024   Name:  Alan Trevino   DOB:  25-Sep-1960   MRN:  [id number]   Chief Complaint: Transitions Of Care (Surgery right leg -05/13/24- doing good since surgery )  HPI  Alan Trevino presents for hospital follow-up after admission to [facility] 05/13/2024 - 05/14/2024 for right leg and right foot pain due to critical limb ischemia secondary to medication noncompliance (ran out of Plavix  and atorvastatin  months ago and did not refill).  TOC call completed 05/17/2024.  During his admission he was started on heparin  drip followed by RLE thrombectomy with stent to right SFA, then discharged on DAPT.  Will also be following up with vascular 06/10/24.  Routine labs were ordered by me back in August but not completed. He says he will do these today.   Was scheduled for OV with me anyway 06/14/24 for medication refills but will take care of this for him today.   Sees psych q65m for depression and ADHD, presently receives buspirone , bupropion , and methylphenidate  from them.   Medication list has been reviewed and updated.  Active Medications[1]   Review of Systems  Patient Active Problem List   Diagnosis Date Noted   Limb ischemia 05/13/2024   Atherosclerosis of native arteries of extremity with intermittent claudication 07/20/2023   Acute lower limb ischemia 02/28/2023   History of GI bleed 02/28/2023   History of colonic polyps    Hiatal hernia    Iron deficiency anemia 08/25/2014   DDD (degenerative disc disease), lumbosacral 08/25/2014   Recurrent major depressive episodes 08/25/2014   Essential (primary) hypertension 08/25/2014   HLD (hyperlipidemia) 08/25/2014   Restless leg syndrome 07/27/2008    Allergies[2]  Immunization History  Administered Date(s) Administered   Influenza,inj,Quad PF,6+ Mos 03/28/2017, 01/02/2018, 02/01/2019, 03/09/2020   Influenza-Unspecified 01/20/2022   Moderna Sars-Covid-2 Vaccination 10/16/2019, 11/13/2019   Tdap 01/02/2018    Past Surgical History:   Procedure Laterality Date   COLONOSCOPY WITH PROPOFOL  N/A 12/29/2015   Procedure: COLONOSCOPY WITH PROPOFOL ;  Surgeon: Rogelia Copping, MD;  Location: [facility];  Service: Endoscopy;  Laterality: N/A;   COLONOSCOPY WITH PROPOFOL  N/A 11/29/2019   Procedure: COLONOSCOPY WITH PROPOFOL ;  Surgeon: Copping Rogelia, MD;  Location: [facility];  Service: Endoscopy;  Laterality: N/A;  priority 4   ESOPHAGEAL DILATION     stretching   ESOPHAGEAL DILATION  01/17/2017   Procedure: ESOPHAGEAL DILATION;  Surgeon: Copping Rogelia, MD;  Location: [facility];  Service: Gastroenterology;;   ESOPHAGOGASTRODUODENOSCOPY (EGD) WITH PROPOFOL  N/A 11/29/2016   Procedure: ESOPHAGOGASTRODUODENOSCOPY (EGD) WITH PROPOFOL ;  Surgeon: Copping Rogelia, MD;  Location: [facility] ENDOSCOPY;  Service: Endoscopy;  Laterality: N/A;   ESOPHAGOGASTRODUODENOSCOPY (EGD) WITH PROPOFOL  N/A 01/17/2017   Procedure: ESOPHAGOGASTRODUODENOSCOPY (EGD) WITH PROPOFOL ;  Surgeon: Copping Rogelia, MD;  Location: [facility];  Service: Gastroenterology;  Laterality: N/A;   HERNIA REPAIR Right    INGUINAL HERNIA REPAIR Left 01/14/2020   Procedure: HERNIA REPAIR INGUINAL ADULT;  Surgeon: Dessa Reyes ORN, MD;  Location: [facility] ORS;  Service: General;  Laterality: Left;   LOWER EXTREMITY ANGIOGRAPHY Right 02/28/2023   Procedure: Lower Extremity Angiography;  Surgeon: Jama Cordella MATSU, MD;  Location: [facility] INVAS[facility] CV [facility];  Service: Cardiovascular;  Laterality: Right;   LOWER EXTREMITY ANGIOGRAPHY Right 05/13/2024   Procedure: Lower Extremity Angiography;  Surgeon: Marea Selinda RAMAN, MD;  Location: [facility] CV LAB;  Service: Cardiovascular;  Laterality: Right;   POLYPECTOMY N/A 12/29/2015   Procedure: POLYPECTOMY;  Surgeon: Rogelia Copping, MD;  Location: [facility];  Service: Endoscopy;  Laterality: N/A;   POLYPECTOMY  01/17/2017   Procedure: POLYPECTOMY;  Surgeon: Jinny Carmine, MD;  Location: [facility];  Service:  Gastroenterology;;   POLYPECTOMY N/A 11/29/2019   Procedure: POLYPECTOMY;  Surgeon: Jinny Carmine, MD;  Location: [facility];  Service: Endoscopy;  Laterality: N/A;   SHOULDER SURGERY Right 09/20/2009   TOTAL HIP ARTHROPLASTY Right 12/21/2020   Procedure: TOTAL HIP ARTHROPLASTY ANTERIOR APPROACH;  Surgeon: Kathlynn Sharper, MD;  Location: [facility] ORS;  Service: Orthopedics;  Laterality: Right;    Social History[3]  Family History  Problem Relation Age of Onset   Ovarian cancer Mother    CAD Father         05/28/2024    9:54 AM 12/19/2023    7:56 AM 07/07/2023    8:27 AM 03/07/2023    1:25 PM  GAD 7 : Generalized Anxiety Score  Nervous, Anxious, on Edge 0 0  0  0   Control/stop worrying 0 0  0  0   Worry too much - different things 0 0  0  0   Trouble relaxing 0 0  0  0   Restless 0 0  0  0   Easily annoyed or irritable 0 0  0  0   Afraid - awful might happen 0 0  0  0   Total GAD 7 Score 0 0 0 0  Anxiety Difficulty Not difficult at all Not difficult at all Not difficult at all Not difficult at all     Data saved with a previous flowsheet row definition       05/28/2024    9:54 AM 12/19/2023    7:56 AM 07/07/2023    8:27 AM  Depression screen PHQ 2/9  Decreased Interest 0 0 0  Down, Depressed, Hopeless 0 0 0  PHQ - 2 Score 0 0 0  Altered sleeping  0 0  Tired, decreased energy  0 0  Change in appetite  0 0  Feeling bad or failure about yourself   0 0  Trouble concentrating  0 0  Moving slowly or fidgety/restless  0 0  Suicidal thoughts  0 0  PHQ-9 Score  0  0   Difficult doing work/chores  Not difficult at all Not difficult at all     Data saved with a previous flowsheet row definition    BP Readings from Last 3 Encounters:  05/28/24 102/72  05/14/24 (!) 140/77  02/12/24 (!) 143/81    Wt Readings from Last 3 Encounters:  05/28/24 189 lb (85.7 kg)  05/13/24 170 lb (77.1 kg)  02/12/24 184 lb 3.2 oz (83.6 kg)    BP 102/72   Pulse 75   Temp 97.6 F (36.4 C)    Ht 5' 3 (1.6 m)   Wt 189 lb (85.7 kg)   SpO2 97%   BMI 33.48 kg/m   Physical Exam Vitals and nursing note reviewed.  Constitutional:      Appearance: Normal appearance.  Cardiovascular:     Rate and Rhythm: Normal rate and regular rhythm.     Pulses:          Posterior tibial pulses are 1+ on the right side and 1+ on the left side.     Heart sounds: No murmur heard.    No friction rub. No gallop.  Pulmonary:     Effort: Pulmonary effort is normal.     Breath sounds: Normal breath sounds.  Abdominal:     General: There is no distension.  Musculoskeletal:        General: Normal range of motion.     Right lower leg: 1+ Pitting Edema present.     Left lower leg: 1+ Pitting Edema present.  Skin:    General: Skin is warm and dry.  Neurological:     Mental Status: He is alert and oriented to person, place, and time.     Gait: Gait is intact.  Psychiatric:        Mood and Affect: Mood and affect normal.     Recent Labs     Component Value Date/Time   NA 141 05/13/2024 0908   NA 142 07/07/2023 0902   NA 139 04/16/2013 1607   K 3.8 05/13/2024 0908   K 4.0 04/16/2013 1607   CL 106 05/13/2024 0908   CL 97 (L) 04/16/2013 1607   CO2 23 05/13/2024 0908   CO2 31 04/16/2013 1607   GLUCOSE 144 (H) 05/13/2024 0908   GLUCOSE 106 (H) 04/16/2013 1607   BUN 9 05/13/2024 0908   BUN 11 07/07/2023 0902   BUN 10 04/16/2013 1607   CREATININE 1.11 05/13/2024 0908   CREATININE 1.12 04/16/2013 1607   CALCIUM  9.3 05/13/2024 0908   CALCIUM  9.6 04/16/2013 1607   PROT 8.3 (H) 03/08/2023 1914   PROT 6.8 11/15/2022 1436   PROT 8.2 04/16/2013 1607   ALBUMIN 4.3 07/07/2023 0902   ALBUMIN 3.8 04/16/2013 1607   AST 26 03/08/2023 1914   AST 21 04/16/2013 1607   ALT 27 03/08/2023 1914   ALT 32 04/16/2013 1607   ALKPHOS 65 03/08/2023 1914   ALKPHOS 74 04/16/2013 1607   BILITOT 0.5 03/08/2023 1914   BILITOT 0.5 11/15/2022 1436   BILITOT 0.3 04/16/2013 1607   GFRNONAA >60 05/13/2024 0908    GFRNONAA >60 04/16/2013 1607   GFRAA 69 03/09/2020 1423   GFRAA >60 04/16/2013 1607    Lab Results  Component Value Date   WBC 10.9 (H) 05/14/2024   HGB 14.0 05/14/2024   HCT 42.0 05/14/2024   MCV 95.7 05/14/2024   PLT 166 05/14/2024   No results found for: HGBA1C Lab Results  Component Value Date   CHOL 197 05/14/2024   HDL 48 05/14/2024   LDLCALC 126 (H) 05/14/2024   TRIG 116 05/14/2024   CHOLHDL 4.1 05/14/2024   No results found for: TSH      Assessment & Plan Atherosclerosis of native artery of both lower extremities with intermittent claudication Refill antilipid medications, Plavix , and aspirin .  This is his second occurrence of limb ischemia requiring surgical intervention.  He will likely be on DAPT for life.  Emphasized the importance of medication compliance. Orders:   aspirin  EC 81 MG tablet; Take 1 tablet (81 mg total) by mouth daily. Swallow whole.   rosuvastatin  (CRESTOR ) 10 MG tablet; Take 1 tablet (10 mg total) by mouth daily.   clopidogrel  (PLAVIX ) 75 MG tablet; Take 1 tablet (75 mg total) by mouth daily.   ezetimibe  (ZETIA ) 10 MG tablet; Take 1 tablet (10 mg total) by mouth daily.  PAD (peripheral artery disease) Refill antilipid medications, Plavix , and aspirin .  This is his second occurrence of limb ischemia requiring surgical intervention.  He will likely be on DAPT for life.  Emphasized the importance of medication compliance. Orders:   aspirin  EC 81 MG tablet; Take 1 tablet (81 mg total) by mouth daily. Swallow whole.   rosuvastatin  (CRESTOR ) 10 MG tablet; Take  1 tablet (10 mg total) by mouth daily.   clopidogrel  (PLAVIX ) 75 MG tablet; Take 1 tablet (75 mg total) by mouth daily.   ezetimibe  (ZETIA ) 10 MG tablet; Take 1 tablet (10 mg total) by mouth daily.  Noncompliance with medication regimen Emphasized the importance of medication compliance.  Patient to let me know if he is ever going to be running out of any medications, even if he does  not have an appointment with me    Difficulty concentrating It is unclear to me at this time if Ritalin  is being used for true ADHD diagnosis or as an adjunct to managing depression.  In any event, encouraged patient to make an appointment with his behavioral health provider sooner than his next scheduled one in 3 months to discuss alternatives.  We discussed the vasoconstrictive nature of stimulant medications which I feel is a relative contraindication with his existing peripheral artery disease requiring 2 prior hospitalizations.    Pure hypertriglyceridemia We will switch atorvastatin  to rosuvastatin  given reported myalgia/arthralgia with atorvastatin .  Checking lipids today Orders:   rosuvastatin  (CRESTOR ) 10 MG tablet; Take 1 tablet (10 mg total) by mouth daily.   ezetimibe  (ZETIA ) 10 MG tablet; Take 1 tablet (10 mg total) by mouth daily.  Essential (primary) hypertension Blood pressure seems well-controlled with current therapy, perhaps even a bit low.  For now we will keep the dose of pharmacotherapy the same but consider dose decrease in the future especially if symptomatic. Orders:   amLODipine -benazepril  (LOTREL) 10-20 MG capsule; Take 1 capsule by mouth daily.  Gastroesophageal reflux disease, unspecified whether esophagitis present Refill pantoprazole  for GERD in the context of hiatal hernia Orders:   pantoprazole  (PROTONIX ) 20 MG tablet; Take 1 tablet (20 mg total) by mouth daily.   Follow-up 6 months CPE, sooner if needed   Rolan Hoyle, PA-C, DMSc, DipACLM, Nutritionist [facility] [phone]      [1]  Current Meds  Medication Sig   amLODipine -benazepril  (LOTREL) 10-20 MG capsule Take 1 capsule by mouth daily.   aspirin  EC 81 MG tablet Take 1 tablet (81 mg total) by mouth daily. Swallow whole.   atorvastatin  (LIPITOR) 40 MG tablet Take 1 tablet (40 mg total) by mouth daily. TAKE 1  TABLET BY MOUTH EVERY DAY   buPROPion  (WELLBUTRIN  XL) 300 MG 24 hr tablet Take 300 mg by mouth daily.    busPIRone  (BUSPAR ) 30 MG tablet Take 30 mg by mouth 2 (two) times daily.    clopidogrel  (PLAVIX ) 75 MG tablet Take 1 tablet (75 mg total) by mouth daily.   cyclobenzaprine  (FLEXERIL ) 5 MG tablet Take 1 tablet (5 mg total) by mouth 3 (three) times daily as needed for muscle spasms.   ezetimibe  (ZETIA ) 10 MG tablet Take 1 tablet (10 mg total) by mouth daily.   methylphenidate  (RITALIN ) 20 MG tablet Take 10-20 mg by mouth 3 (three) times daily with meals.   pantoprazole  (PROTONIX ) 20 MG tablet Take 1 tablet (20 mg total) by mouth daily.   [DISCONTINUED] HYDROcodone -acetaminophen  (NORCO/VICODIN) 5-325 MG tablet Take 1-2 tablets by mouth every 6 (six) hours as needed for severe pain (pain score 7-10).  [2]  Allergies Allergen Reactions   Simvastatin     Other Reaction(s): myalgia arthralgia   Nsaids     Other Reaction(s): gi bleed  [3]  Social History Tobacco Use   Smoking status: Former    Current packs/day: 0.00    Average  packs/day: 2.0 packs/day for 30.0 years (60.0 ttl pk-yrs)    Types: Cigarettes    Start date: 59    Quit date: 2000    Years since quitting: 26.1   Smokeless tobacco: Current    Types: Chew  Vaping Use   Vaping status: Never Used  Substance Use Topics   Alcohol use: Yes    Alcohol/week: 12.0 standard drinks of alcohol    Types: 12 Cans of beer per week    Comment: BEER once a week   Drug use: No   "

## 2024-05-28 NOTE — Assessment & Plan Note (Signed)
 Refill pantoprazole  for GERD in the context of hiatal hernia Orders:   pantoprazole  (PROTONIX ) 20 MG tablet; Take 1 tablet (20 mg total) by mouth daily.

## 2024-05-28 NOTE — Assessment & Plan Note (Addendum)
 Refill antilipid medications, Plavix , and aspirin .  This is his second occurrence of limb ischemia requiring surgical intervention.  He will likely be on DAPT for life.  Emphasized the importance of medication compliance. Orders:   aspirin  EC 81 MG tablet; Take 1 tablet (81 mg total) by mouth daily. Swallow whole.   rosuvastatin  (CRESTOR ) 10 MG tablet; Take 1 tablet (10 mg total) by mouth daily.   clopidogrel  (PLAVIX ) 75 MG tablet; Take 1 tablet (75 mg total) by mouth daily.   ezetimibe  (ZETIA ) 10 MG tablet; Take 1 tablet (10 mg total) by mouth daily.

## 2024-05-28 NOTE — Assessment & Plan Note (Signed)
 It is unclear to me at this time if Ritalin  is being used for true ADHD diagnosis or as an adjunct to managing depression.  In any event, encouraged patient to make an appointment with his behavioral health provider sooner than his next scheduled one in 3 months to discuss alternatives.  We discussed the vasoconstrictive nature of stimulant medications which I feel is a relative contraindication with his existing peripheral artery disease requiring 2 prior hospitalizations.

## 2024-05-28 NOTE — Assessment & Plan Note (Signed)
 We will switch atorvastatin  to rosuvastatin  given reported myalgia/arthralgia with atorvastatin .  Checking lipids today Orders:   rosuvastatin  (CRESTOR ) 10 MG tablet; Take 1 tablet (10 mg total) by mouth daily.   ezetimibe  (ZETIA ) 10 MG tablet; Take 1 tablet (10 mg total) by mouth daily.

## 2024-06-10 ENCOUNTER — Ambulatory Visit (INDEPENDENT_AMBULATORY_CARE_PROVIDER_SITE_OTHER): Admitting: Nurse Practitioner

## 2024-06-10 ENCOUNTER — Encounter (INDEPENDENT_AMBULATORY_CARE_PROVIDER_SITE_OTHER)

## 2024-06-14 ENCOUNTER — Ambulatory Visit: Admitting: Physician Assistant

## 2024-12-03 ENCOUNTER — Encounter: Admitting: Physician Assistant

## 2025-02-11 ENCOUNTER — Encounter (INDEPENDENT_AMBULATORY_CARE_PROVIDER_SITE_OTHER)

## 2025-02-11 ENCOUNTER — Ambulatory Visit (INDEPENDENT_AMBULATORY_CARE_PROVIDER_SITE_OTHER): Admitting: Nurse Practitioner
